# Patient Record
Sex: Female | Born: 1955
Health system: Southern US, Community
[De-identification: ages and names within clinical notes are randomized; demographics above are authoritative.]

## PROBLEM LIST (undated history)

## (undated) DIAGNOSIS — T4145XA Adverse effect of unspecified anesthetic, initial encounter: Secondary | ICD-10-CM

## (undated) DIAGNOSIS — E785 Hyperlipidemia, unspecified: Secondary | ICD-10-CM

## (undated) DIAGNOSIS — M1990 Unspecified osteoarthritis, unspecified site: Secondary | ICD-10-CM

## (undated) DIAGNOSIS — Z9889 Other specified postprocedural states: Secondary | ICD-10-CM

## (undated) DIAGNOSIS — R42 Dizziness and giddiness: Secondary | ICD-10-CM

## (undated) DIAGNOSIS — T8859XA Other complications of anesthesia, initial encounter: Secondary | ICD-10-CM

## (undated) DIAGNOSIS — I10 Essential (primary) hypertension: Secondary | ICD-10-CM

## (undated) DIAGNOSIS — R112 Nausea with vomiting, unspecified: Secondary | ICD-10-CM

## (undated) DIAGNOSIS — K5792 Diverticulitis of intestine, part unspecified, without perforation or abscess without bleeding: Secondary | ICD-10-CM

## (undated) DIAGNOSIS — G8929 Other chronic pain: Secondary | ICD-10-CM

## (undated) DIAGNOSIS — F419 Anxiety disorder, unspecified: Secondary | ICD-10-CM

## (undated) DIAGNOSIS — K219 Gastro-esophageal reflux disease without esophagitis: Secondary | ICD-10-CM

## (undated) DIAGNOSIS — T7840XA Allergy, unspecified, initial encounter: Secondary | ICD-10-CM

## (undated) DIAGNOSIS — J45909 Unspecified asthma, uncomplicated: Secondary | ICD-10-CM

## (undated) DIAGNOSIS — M549 Dorsalgia, unspecified: Secondary | ICD-10-CM

## (undated) HISTORY — PX: ABDOMINAL HYSTERECTOMY: SHX81

## (undated) HISTORY — DX: Hyperlipidemia, unspecified: E78.5

## (undated) HISTORY — DX: Anxiety disorder, unspecified: F41.9

## (undated) HISTORY — PX: OTHER SURGICAL HISTORY: SHX169

## (undated) HISTORY — PX: HIP SURGERY: SHX245

## (undated) HISTORY — PX: HAND SURGERY: SHX662

## (undated) HISTORY — DX: Unspecified osteoarthritis, unspecified site: M19.90

## (undated) HISTORY — DX: Allergy, unspecified, initial encounter: T78.40XA

---
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
## 1998-02-03 ENCOUNTER — Other Ambulatory Visit: Admission: RE | Admit: 1998-02-03 | Discharge: 1998-02-03 | Payer: Self-pay

## 1998-10-30 ENCOUNTER — Emergency Department (HOSPITAL_COMMUNITY): Admission: EM | Admit: 1998-10-30 | Discharge: 1998-10-30 | Payer: Self-pay | Admitting: Internal Medicine

## 1999-09-23 ENCOUNTER — Emergency Department (HOSPITAL_COMMUNITY): Admission: EM | Admit: 1999-09-23 | Discharge: 1999-09-23 | Payer: Self-pay | Admitting: Emergency Medicine

## 2001-01-22 ENCOUNTER — Emergency Department (HOSPITAL_COMMUNITY): Admission: EM | Admit: 2001-01-22 | Discharge: 2001-01-22 | Payer: Self-pay | Admitting: Emergency Medicine

## 2001-01-30 ENCOUNTER — Encounter: Payer: Self-pay | Admitting: Internal Medicine

## 2001-01-30 ENCOUNTER — Ambulatory Visit (HOSPITAL_COMMUNITY): Admission: RE | Admit: 2001-01-30 | Discharge: 2001-01-30 | Payer: Self-pay | Admitting: Internal Medicine

## 2001-04-15 ENCOUNTER — Emergency Department (HOSPITAL_COMMUNITY): Admission: EM | Admit: 2001-04-15 | Discharge: 2001-04-15 | Payer: Self-pay | Admitting: Emergency Medicine

## 2001-04-25 ENCOUNTER — Emergency Department (HOSPITAL_COMMUNITY): Admission: EM | Admit: 2001-04-25 | Discharge: 2001-04-25 | Payer: Self-pay | Admitting: *Deleted

## 2002-05-16 ENCOUNTER — Emergency Department (HOSPITAL_COMMUNITY): Admission: EM | Admit: 2002-05-16 | Discharge: 2002-05-16 | Payer: Self-pay | Admitting: Emergency Medicine

## 2003-12-13 ENCOUNTER — Emergency Department (HOSPITAL_COMMUNITY): Admission: EM | Admit: 2003-12-13 | Discharge: 2003-12-13 | Payer: Self-pay | Admitting: Emergency Medicine

## 2004-05-14 ENCOUNTER — Emergency Department (HOSPITAL_COMMUNITY): Admission: EM | Admit: 2004-05-14 | Discharge: 2004-05-14 | Payer: Self-pay | Admitting: Emergency Medicine

## 2004-05-15 ENCOUNTER — Emergency Department (HOSPITAL_COMMUNITY): Admission: EM | Admit: 2004-05-15 | Discharge: 2004-05-15 | Payer: Self-pay | Admitting: Emergency Medicine

## 2004-12-02 ENCOUNTER — Emergency Department (HOSPITAL_COMMUNITY): Admission: EM | Admit: 2004-12-02 | Discharge: 2004-12-02 | Payer: Self-pay | Admitting: Emergency Medicine

## 2005-01-15 ENCOUNTER — Emergency Department (HOSPITAL_COMMUNITY): Admission: EM | Admit: 2005-01-15 | Discharge: 2005-01-15 | Payer: Self-pay | Admitting: Emergency Medicine

## 2005-06-30 ENCOUNTER — Emergency Department (HOSPITAL_COMMUNITY): Admission: EM | Admit: 2005-06-30 | Discharge: 2005-07-01 | Payer: Self-pay | Admitting: Emergency Medicine

## 2005-08-02 ENCOUNTER — Emergency Department (HOSPITAL_COMMUNITY): Admission: EM | Admit: 2005-08-02 | Discharge: 2005-08-02 | Payer: Self-pay | Admitting: Emergency Medicine

## 2005-08-10 ENCOUNTER — Ambulatory Visit: Payer: Self-pay | Admitting: Family Medicine

## 2005-08-14 ENCOUNTER — Ambulatory Visit: Payer: Self-pay | Admitting: *Deleted

## 2005-08-22 ENCOUNTER — Ambulatory Visit: Payer: Self-pay | Admitting: Family Medicine

## 2005-09-14 ENCOUNTER — Ambulatory Visit: Payer: Self-pay | Admitting: Family Medicine

## 2005-09-28 ENCOUNTER — Ambulatory Visit: Payer: Self-pay | Admitting: Family Medicine

## 2005-10-11 ENCOUNTER — Emergency Department (HOSPITAL_COMMUNITY): Admission: EM | Admit: 2005-10-11 | Discharge: 2005-10-11 | Payer: Self-pay | Admitting: Emergency Medicine

## 2005-11-26 ENCOUNTER — Ambulatory Visit: Payer: Self-pay | Admitting: Family Medicine

## 2006-07-09 ENCOUNTER — Ambulatory Visit: Payer: Self-pay | Admitting: Family Medicine

## 2006-11-25 ENCOUNTER — Ambulatory Visit: Payer: Self-pay | Admitting: Family Medicine

## 2007-03-25 ENCOUNTER — Ambulatory Visit: Payer: Self-pay | Admitting: Family Medicine

## 2007-03-25 ENCOUNTER — Encounter (INDEPENDENT_AMBULATORY_CARE_PROVIDER_SITE_OTHER): Payer: Self-pay | Admitting: Internal Medicine

## 2007-03-25 LAB — CONVERTED CEMR LAB
Basophils Absolute: 0 10*3/uL (ref 0.0–0.1)
Basophils Relative: 0 % (ref 0–1)
Cholesterol: 175 mg/dL (ref 0–200)
Eosinophils Absolute: 0.1 10*3/uL (ref 0.0–0.7)
Eosinophils Relative: 2 % (ref 0–5)
HCT: 40.9 % (ref 36.0–46.0)
HDL: 33 mg/dL — ABNORMAL LOW (ref >39)
Hemoglobin: 13.6 g/dL (ref 12.0–15.0)
LDL Cholesterol: 112 mg/dL — ABNORMAL HIGH (ref 0–99)
Lymphocytes Relative: 44 % (ref 12–46)
Lymphs Abs: 2.1 10*3/uL (ref 0.7–3.3)
MCHC: 33.3 g/dL (ref 30.0–36.0)
MCV: 90.5 fL (ref 78.0–100.0)
Monocytes Absolute: 0.3 10*3/uL (ref 0.2–0.7)
Monocytes Relative: 7 % (ref 3–11)
Neutro Abs: 2.3 10*3/uL (ref 1.7–7.7)
Neutrophils Relative %: 47 % (ref 43–77)
Platelets: 274 10*3/uL (ref 150–400)
RBC: 4.52 M/uL (ref 3.87–5.11)
RDW: 13.4 % (ref 11.5–14.0)
TSH: 0.889 microintl units/mL (ref 0.350–5.50)
Total CHOL/HDL Ratio: 5.3
Triglycerides: 152 mg/dL — ABNORMAL HIGH (ref <150)
VLDL: 30 mg/dL (ref 0–40)
WBC: 4.9 10*3/uL (ref 4.0–10.5)

## 2007-04-09 ENCOUNTER — Encounter (INDEPENDENT_AMBULATORY_CARE_PROVIDER_SITE_OTHER): Payer: Self-pay | Admitting: *Deleted

## 2007-05-04 ENCOUNTER — Emergency Department (HOSPITAL_COMMUNITY): Admission: EM | Admit: 2007-05-04 | Discharge: 2007-05-04 | Payer: Self-pay | Admitting: Emergency Medicine

## 2007-06-16 ENCOUNTER — Emergency Department (HOSPITAL_COMMUNITY): Admission: EM | Admit: 2007-06-16 | Discharge: 2007-06-16 | Payer: Self-pay | Admitting: Family Medicine

## 2007-09-25 ENCOUNTER — Emergency Department (HOSPITAL_COMMUNITY): Admission: EM | Admit: 2007-09-25 | Discharge: 2007-09-25 | Payer: Self-pay | Admitting: Family Medicine

## 2008-04-28 ENCOUNTER — Encounter: Admission: RE | Admit: 2008-04-28 | Discharge: 2008-04-28 | Payer: Self-pay | Admitting: Internal Medicine

## 2008-09-12 ENCOUNTER — Inpatient Hospital Stay (HOSPITAL_COMMUNITY): Admission: AD | Admit: 2008-09-12 | Discharge: 2008-09-13 | Payer: Self-pay | Admitting: Obstetrics and Gynecology

## 2008-09-14 ENCOUNTER — Ambulatory Visit (HOSPITAL_COMMUNITY): Admission: RE | Admit: 2008-09-14 | Discharge: 2008-09-14 | Payer: Self-pay | Admitting: Obstetrics & Gynecology

## 2009-05-21 ENCOUNTER — Emergency Department (HOSPITAL_COMMUNITY): Admission: EM | Admit: 2009-05-21 | Discharge: 2009-05-21 | Payer: Self-pay | Admitting: Emergency Medicine

## 2009-05-25 ENCOUNTER — Inpatient Hospital Stay (HOSPITAL_COMMUNITY): Admission: AD | Admit: 2009-05-25 | Discharge: 2009-05-25 | Payer: Self-pay | Admitting: Obstetrics and Gynecology

## 2009-08-29 ENCOUNTER — Observation Stay (HOSPITAL_COMMUNITY): Admission: EM | Admit: 2009-08-29 | Discharge: 2009-08-31 | Payer: Self-pay | Admitting: Emergency Medicine

## 2010-05-03 ENCOUNTER — Encounter: Payer: Self-pay | Admitting: Family Medicine

## 2010-08-24 NOTE — Miscellaneous (Signed)
Summary: VIP  Patient: Christina Ray Note: All result statuses are Final unless otherwise noted.  Tests: (1) VIP (Medications)   LLIMPORTMEDS              "Result Below..."       RESULT: RHINOCORT AQUA SUSP 32 MCG/ACT*SPRAY 2 PUFFS IN EACH NOSTRIL DAILY*10/24/2006*Last Refill: [email or web address]*******   LLIMPORTMEDS              "Result Below..."       RESULT: NASACORT AQ AERS 55 MCG/ACT*USE 1-2 SPRAYS IN EACH NOSTRIL DAILY*11/25/2006*Last Refill: [email or web address]*[id number]*******   LLIMPORTMEDS              "Result Below..."       RESULT: LISINOPRIL-HYDROCHLOROTHIAZIDE TABS 20-25 MG*TAKE ONE (1) TABLET BY MOUTH EVERY DAY*11/25/2006*Last Refill: 04/22/2007*[id number]*******   LLIMPORTMEDS              "Result Below..."       RESULT: FAMOTIDINE TABS 20 MG*TAKE 1 TO 2 TABLETS EVERY DAY AT BEDTIME*03/25/2007*Last Refill: [email or web address]*******   LLIMPORTMEDS              "Result Below..."       RESULT: ATENOLOL TABS 50 MG*TAKE TWO (2) TABLETS BY MOUTH DAILY*11/25/2006*Last Refill: 04/22/2007*2012*******   LLIMPORTMEDS              "Result Below..."       RESULT: ALLEGRA TABS 180 MG*TAKE ONE (1) TABLET BY MOUTH EVERY DAY*11/25/2006*Last Refill: 03/20/2007*[id number]*******   LLIMPORTALLS              NKDA***  Note: An exclamation mark (!) indicates a result that was not dispersed into the flowsheet. Document Creation Date: 05/22/2007 3:05 PM _______________________________________________________________________  (1) Order result status: Final Collection or observation date-time: 04/09/2007 Requested date-time: 04/09/2007 Receipt date-time:  Reported date-time: 04/09/2007 Referring Physician:   Ordering Physician:   Specimen Source:  Source: [facility] [place] Order Number:  Lab site:

## 2010-08-24 NOTE — Miscellaneous (Signed)
Summary: Home Care Report  Home Care Report   Imported By: Lind Guest 05/03/2010 11:13:39  _____________________________________________________________________  External Attachment:    Type:   Image     Comment:   External Document

## 2010-09-27 ENCOUNTER — Emergency Department (HOSPITAL_COMMUNITY)
Admission: EM | Admit: 2010-09-27 | Discharge: 2010-09-27 | Disposition: A | Discharge status: Home / Self Care | Payer: BC Managed Care – PPO | Attending: Emergency Medicine | Admitting: Emergency Medicine

## 2010-09-27 DIAGNOSIS — I10 Essential (primary) hypertension: Secondary | ICD-10-CM | POA: Insufficient documentation

## 2010-09-27 DIAGNOSIS — H109 Unspecified conjunctivitis: Secondary | ICD-10-CM | POA: Insufficient documentation

## 2010-10-11 LAB — COMPREHENSIVE METABOLIC PANEL
ALT: 13 U/L (ref 0–35)
AST: 14 U/L (ref 0–37)
Albumin: 3.1 g/dL — ABNORMAL LOW (ref 3.5–5.2)
Alkaline Phosphatase: 47 U/L (ref 39–117)
BUN: 13 mg/dL (ref 6–23)
CO2: 30 mEq/L (ref 19–32)
Calcium: 9.1 mg/dL (ref 8.4–10.5)
Chloride: 104 mEq/L (ref 96–112)
Creatinine, Ser: 0.96 mg/dL (ref 0.4–1.2)
GFR calc Af Amer: >60 mL/min (ref >60)
GFR calc non Af Amer: >60 mL/min (ref >60)
Glucose, Bld: 85 mg/dL (ref 70–99)
Potassium: 3.8 mEq/L (ref 3.5–5.1)
Sodium: 141 mEq/L (ref 135–145)
Total Bilirubin: 0.7 mg/dL (ref 0.3–1.2)
Total Protein: 6.7 g/dL (ref 6.0–8.3)

## 2010-10-11 LAB — DIFFERENTIAL
Basophils Absolute: 0 10*3/uL (ref 0.0–0.1)
Basophils Relative: 1 % (ref 0–1)
Eosinophils Absolute: 0.1 10*3/uL (ref 0.0–0.7)
Eosinophils Relative: 2 % (ref 0–5)
Lymphocytes Relative: 54 % — ABNORMAL HIGH (ref 12–46)
Lymphs Abs: 3.1 10*3/uL (ref 0.7–4.0)
Monocytes Absolute: 0.3 10*3/uL (ref 0.1–1.0)
Monocytes Relative: 6 % (ref 3–12)
Neutro Abs: 2.1 10*3/uL (ref 1.7–7.7)
Neutrophils Relative %: 37 % — ABNORMAL LOW (ref 43–77)

## 2010-10-11 LAB — CBC
HCT: 34.1 % — ABNORMAL LOW (ref 36.0–46.0)
Hemoglobin: 11.9 g/dL — ABNORMAL LOW (ref 12.0–15.0)
MCHC: 35 g/dL (ref 30.0–36.0)
MCV: 92.3 fL (ref 78.0–100.0)
Platelets: 238 10*3/uL (ref 150–400)
RBC: 3.69 MIL/uL — ABNORMAL LOW (ref 3.87–5.11)
RDW: 13.4 % (ref 11.5–15.5)
WBC: 5.7 10*3/uL (ref 4.0–10.5)

## 2010-10-11 LAB — URINALYSIS, ROUTINE W REFLEX MICROSCOPIC
Bilirubin Urine: NEGATIVE
Glucose, UA: NEGATIVE mg/dL
Hgb urine dipstick: NEGATIVE
Ketones, ur: NEGATIVE mg/dL
Nitrite: NEGATIVE
Protein, ur: NEGATIVE mg/dL
Specific Gravity, Urine: 1.014 (ref 1.005–1.030)
Urobilinogen, UA: 0.2 mg/dL (ref 0.0–1.0)
pH: 6.5 (ref 5.0–8.0)

## 2010-10-11 LAB — BRAIN NATRIURETIC PEPTIDE: Pro B Natriuretic peptide (BNP): 33 pg/mL (ref 0.0–100.0)

## 2010-10-11 LAB — POCT I-STAT, CHEM 8
BUN: 12 mg/dL (ref 6–23)
Calcium, Ion: 1.17 mmol/L (ref 1.12–1.32)
Chloride: 104 mEq/L (ref 96–112)
Creatinine, Ser: 1 mg/dL (ref 0.4–1.2)
Glucose, Bld: 91 mg/dL (ref 70–99)
HCT: 41 % (ref 36.0–46.0)
Hemoglobin: 13.9 g/dL (ref 12.0–15.0)
Potassium: 3.5 mEq/L (ref 3.5–5.1)
Sodium: 140 mEq/L (ref 135–145)
TCO2: 31 mmol/L (ref 0–100)

## 2010-10-11 LAB — TSH: TSH: 1.177 u[IU]/mL (ref 0.350–4.500)

## 2010-10-11 LAB — LIPID PANEL
Cholesterol: 190 mg/dL (ref 0–200)
HDL: 36 mg/dL — ABNORMAL LOW (ref >39)
LDL Cholesterol: 136 mg/dL — ABNORMAL HIGH (ref 0–99)
Total CHOL/HDL Ratio: 5.3 RATIO
Triglycerides: 92 mg/dL (ref <150)
VLDL: 18 mg/dL (ref 0–40)

## 2010-10-11 LAB — CK TOTAL AND CKMB (NOT AT ARMC)
CK, MB: 1.9 ng/mL (ref 0.3–4.0)
Relative Index: 1.3 (ref 0.0–2.5)
Total CK: 145 U/L (ref 7–177)

## 2010-10-11 LAB — POCT CARDIAC MARKERS
CKMB, poc: 1.6 ng/mL (ref 1.0–8.0)
Myoglobin, poc: 88 ng/mL (ref 12–200)
Troponin i, poc: <0.05 ng/mL (ref 0.00–0.09)

## 2010-10-11 LAB — HEMOGLOBIN A1C
Hgb A1c MFr Bld: 5.9 % (ref 4.6–6.1)
Mean Plasma Glucose: 123 mg/dL

## 2010-10-11 LAB — TROPONIN I: Troponin I: 0.02 ng/mL (ref 0.00–0.06)

## 2010-10-11 LAB — D-DIMER, QUANTITATIVE (NOT AT ARMC): D-Dimer, Quant: 0.44 ug/mL-FEU (ref 0.00–0.48)

## 2010-10-26 LAB — URINALYSIS, ROUTINE W REFLEX MICROSCOPIC
Bilirubin Urine: NEGATIVE
Glucose, UA: NEGATIVE mg/dL
Hgb urine dipstick: NEGATIVE
Ketones, ur: NEGATIVE mg/dL
Nitrite: NEGATIVE
Protein, ur: NEGATIVE mg/dL
Specific Gravity, Urine: 1.011 (ref 1.005–1.030)
Urobilinogen, UA: 0.2 mg/dL (ref 0.0–1.0)
pH: 5.5 (ref 5.0–8.0)

## 2010-10-26 LAB — URINE CULTURE
Colony Count: NO GROWTH
Culture: NO GROWTH

## 2010-10-26 LAB — GC/CHLAMYDIA PROBE AMP, GENITAL
Chlamydia, DNA Probe: NEGATIVE
GC Probe Amp, Genital: NEGATIVE

## 2010-10-26 LAB — WET PREP, GENITAL
Trich, Wet Prep: NONE SEEN
WBC, Wet Prep HPF POC: NONE SEEN
Yeast Wet Prep HPF POC: NONE SEEN

## 2010-10-26 LAB — GLUCOSE, CAPILLARY: Glucose-Capillary: 92 mg/dL (ref 70–99)

## 2010-11-07 LAB — CBC
HCT: 37.9 % (ref 36.0–46.0)
Hemoglobin: 12.8 g/dL (ref 12.0–15.0)
MCHC: 33.7 g/dL (ref 30.0–36.0)
MCV: 91.4 fL (ref 78.0–100.0)
Platelets: 242 10*3/uL (ref 150–400)
RBC: 4.15 MIL/uL (ref 3.87–5.11)
RDW: 13.7 % (ref 11.5–15.5)
WBC: 6.1 10*3/uL (ref 4.0–10.5)

## 2010-11-07 LAB — DIFFERENTIAL
Basophils Absolute: 0 10*3/uL (ref 0.0–0.1)
Basophils Relative: 1 % (ref 0–1)
Eosinophils Absolute: 0.1 10*3/uL (ref 0.0–0.7)
Eosinophils Relative: 1 % (ref 0–5)
Lymphocytes Relative: 46 % (ref 12–46)
Lymphs Abs: 2.8 10*3/uL (ref 0.7–4.0)
Monocytes Absolute: 0.4 10*3/uL (ref 0.1–1.0)
Monocytes Relative: 6 % (ref 3–12)
Neutro Abs: 2.8 10*3/uL (ref 1.7–7.7)
Neutrophils Relative %: 46 % (ref 43–77)

## 2010-11-07 LAB — WET PREP, GENITAL
Trich, Wet Prep: NONE SEEN
Yeast Wet Prep HPF POC: NONE SEEN

## 2010-11-07 LAB — URINALYSIS, ROUTINE W REFLEX MICROSCOPIC
Bilirubin Urine: NEGATIVE
Glucose, UA: NEGATIVE mg/dL
Hgb urine dipstick: NEGATIVE
Ketones, ur: NEGATIVE mg/dL
Nitrite: NEGATIVE
Protein, ur: NEGATIVE mg/dL
Specific Gravity, Urine: >1.03 — ABNORMAL HIGH (ref 1.005–1.030)
Urobilinogen, UA: 0.2 mg/dL (ref 0.0–1.0)
pH: 6 (ref 5.0–8.0)

## 2011-05-01 LAB — CULTURE, ROUTINE-ABSCESS
Culture: NO GROWTH
Gram Stain: NONE SEEN

## 2013-03-21 ENCOUNTER — Emergency Department (HOSPITAL_COMMUNITY): Payer: BC Managed Care – PPO

## 2013-03-21 ENCOUNTER — Emergency Department (HOSPITAL_COMMUNITY)
Admission: EM | Admit: 2013-03-21 | Discharge: 2013-03-21 | Disposition: A | Discharge status: Home / Self Care | Payer: BC Managed Care – PPO | Attending: Emergency Medicine | Admitting: Emergency Medicine

## 2013-03-21 ENCOUNTER — Encounter (HOSPITAL_COMMUNITY): Payer: Self-pay | Admitting: *Deleted

## 2013-03-21 DIAGNOSIS — J45909 Unspecified asthma, uncomplicated: Secondary | ICD-10-CM | POA: Insufficient documentation

## 2013-03-21 DIAGNOSIS — I10 Essential (primary) hypertension: Secondary | ICD-10-CM | POA: Insufficient documentation

## 2013-03-21 DIAGNOSIS — R079 Chest pain, unspecified: Secondary | ICD-10-CM

## 2013-03-21 DIAGNOSIS — R188 Other ascites: Secondary | ICD-10-CM | POA: Insufficient documentation

## 2013-03-21 DIAGNOSIS — Z87891 Personal history of nicotine dependence: Secondary | ICD-10-CM | POA: Insufficient documentation

## 2013-03-21 DIAGNOSIS — Z8719 Personal history of other diseases of the digestive system: Secondary | ICD-10-CM | POA: Insufficient documentation

## 2013-03-21 DIAGNOSIS — E669 Obesity, unspecified: Secondary | ICD-10-CM | POA: Insufficient documentation

## 2013-03-21 HISTORY — DX: Gastro-esophageal reflux disease without esophagitis: K21.9

## 2013-03-21 HISTORY — DX: Essential (primary) hypertension: I10

## 2013-03-21 HISTORY — DX: Diverticulitis of intestine, part unspecified, without perforation or abscess without bleeding: K57.92

## 2013-03-21 HISTORY — DX: Unspecified asthma, uncomplicated: J45.909

## 2013-03-21 LAB — BASIC METABOLIC PANEL
BUN: 16 mg/dL (ref 6–23)
CO2: 29 mEq/L (ref 19–32)
Calcium: 9.4 mg/dL (ref 8.4–10.5)
Chloride: 98 mEq/L (ref 96–112)
Creatinine, Ser: 1.1 mg/dL (ref 0.50–1.10)
GFR calc Af Amer: 63 mL/min — ABNORMAL LOW (ref >90)
GFR calc non Af Amer: 54 mL/min — ABNORMAL LOW (ref >90)
Glucose, Bld: 105 mg/dL — ABNORMAL HIGH (ref 70–99)
Potassium: 3.6 mEq/L (ref 3.5–5.1)
Sodium: 136 mEq/L (ref 135–145)

## 2013-03-21 LAB — CBC WITH DIFFERENTIAL/PLATELET
Basophils Absolute: 0 10*3/uL (ref 0.0–0.1)
Basophils Relative: 0 % (ref 0–1)
Eosinophils Absolute: 0.1 10*3/uL (ref 0.0–0.7)
Eosinophils Relative: 2 % (ref 0–5)
HCT: 37.3 % (ref 36.0–46.0)
Hemoglobin: 12.4 g/dL (ref 12.0–15.0)
Lymphocytes Relative: 45 % (ref 12–46)
Lymphs Abs: 2.3 10*3/uL (ref 0.7–4.0)
MCH: 30.2 pg (ref 26.0–34.0)
MCHC: 33.2 g/dL (ref 30.0–36.0)
MCV: 91 fL (ref 78.0–100.0)
Monocytes Absolute: 0.3 10*3/uL (ref 0.1–1.0)
Monocytes Relative: 6 % (ref 3–12)
Neutro Abs: 2.4 10*3/uL (ref 1.7–7.7)
Neutrophils Relative %: 48 % (ref 43–77)
Platelets: 251 10*3/uL (ref 150–400)
RBC: 4.1 MIL/uL (ref 3.87–5.11)
RDW: 13.4 % (ref 11.5–15.5)
WBC: 5.1 10*3/uL (ref 4.0–10.5)

## 2013-03-21 LAB — TROPONIN I: Troponin I: <0.3 ng/mL (ref <0.30)

## 2013-03-21 NOTE — ED Provider Notes (Signed)
CSN: [id number]     Arrival date & time 03/21/13  1826 History   First MD Initiated Contact with Patient 03/21/13 1830     Chief Complaint  Patient presents with  . Chest Pain   (Consider location/radiation/quality/duration/timing/severity/associated sxs/prior Treatment) HPI... episode of lower sternal discomfort since 9 PM last night. Symptoms are currently gone. Pain is worse with positioning and lying down. No dyspnea, diaphoresis, nausea.  No previous heart trouble. She does not smoke. Family history negative. Symptoms were fleeting.  Past Medical History  Diagnosis Date  . Hypertension   . GERD (gastroesophageal reflux disease)   . Asthma   . Diverticulitis    Past Surgical History  Procedure Laterality Date  . Abdominal hysterectomy    . Arthroscopic knee surgery Right   . Hand surgery Right    History reviewed. No pertinent family history. History  Substance Use Topics  . Smoking status: Former Games developer  . Smokeless tobacco: Not on file  . Alcohol Use: No   OB History   Grav Para Term Preterm Abortions TAB SAB Ect Mult Living                 Review of Systems  All other systems reviewed and are negative.    Allergies  Review of patient's allergies indicates no known allergies.  Home Medications  No current outpatient prescriptions on file. BP 144/83  Pulse 67  Temp(Src) 98.5 F (36.9 C) (Oral)  Resp 20  Ht 5\' 11"  (1.803 m)  Wt 322 lb 6 oz (146.228 kg)  BMI 44.98 kg/m2  SpO2 100% Physical Exam  Nursing note and vitals reviewed. Constitutional: She is oriented to person, place, and time. She appears well-developed and well-nourished.  Obese  HENT:  Head: Normocephalic and atraumatic.  Eyes: Conjunctivae and EOM are normal. Pupils are equal, round, and reactive to light.  Neck: Normal range of motion. Neck supple.  Cardiovascular: Normal rate, regular rhythm and normal heart sounds.   Pulmonary/Chest: Effort normal and breath sounds normal.   Abdominal: Soft. Bowel sounds are normal.  Musculoskeletal: Normal range of motion.  Neurological: She is alert and oriented to person, place, and time.  Skin: Skin is warm and dry.  Psychiatric: She has a normal mood and affect.    ED Course  Procedures (including critical care time) Labs Review Labs Reviewed  BASIC METABOLIC PANEL - Abnormal; Notable for the following:    Glucose, Bld 105 (*)    GFR calc non Af Amer 54 (*)    GFR calc Af Amer 63 (*)    All other components within normal limits  TROPONIN I  CBC WITH DIFFERENTIAL   Imaging Review No results found.  Date: 03/21/2013  Rate: 59  Rhythm: sinus brady  QRS Axis: left  Intervals: normal  ST/T Wave abnormalities: normal  Conduction Disutrbances: none  Narrative Interpretation: unremarkable    MDM  No diagnosis found. Symptoms are completely gone.  EKG and troponin negative. Patient in no acute distress    Donnetta Hutching, MD 03/21/13 2131

## 2013-03-21 NOTE — ED Notes (Addendum)
Patient c/o chest discomfort, burning, soreness w/burping and flatus since last night. No n/v or diaphoresis. Has progressively gotten worse.  Also feels heart racing.  Took some pepto-bismal last night which relieved it enough to sleep.  Also too 81 mg ASA. Has taken advil and tylenol.

## 2013-09-26 ENCOUNTER — Emergency Department (HOSPITAL_COMMUNITY)
Admission: EM | Admit: 2013-09-26 | Discharge: 2013-09-26 | Disposition: A | Discharge status: Home / Self Care | Payer: BC Managed Care – PPO | Source: Home / Self Care | Attending: Family Medicine | Admitting: Family Medicine

## 2013-09-26 ENCOUNTER — Encounter (HOSPITAL_COMMUNITY): Payer: Self-pay | Admitting: Emergency Medicine

## 2013-09-26 DIAGNOSIS — J019 Acute sinusitis, unspecified: Secondary | ICD-10-CM

## 2013-09-26 MED ORDER — IPRATROPIUM BROMIDE 0.06 % NA SOLN: 2.0000 | Freq: Four times a day (QID) | NASAL | Status: DC

## 2013-09-26 MED ORDER — MINOCYCLINE HCL 100 MG PO CAPS: 100.0000 mg | ORAL_CAPSULE | Freq: Two times a day (BID) | ORAL | Status: DC

## 2013-09-26 NOTE — ED Notes (Signed)
Pt  Reports  Symptoms  Of  Nasal  stuffyness           sorethroat  And  Mucous  Production        With  The  Symptoms  X  2  Weeks

## 2013-09-26 NOTE — Discharge Instructions (Signed)
Drink plenty of fluids as discussed, use medicine as prescribed, and mucinex or delsym for cough. Return or see your doctor if further problems °

## 2013-09-26 NOTE — ED Provider Notes (Signed)
CSN: [id number]     Arrival date & time 09/26/13  1026 History   First MD Initiated Contact with Patient 09/26/13 1130     Chief Complaint  Patient presents with  . URI   (Consider location/radiation/quality/duration/timing/severity/associated sxs/prior Treatment) Patient is a 58 y.o. female presenting with URI. The history is provided by the patient.  URI Presenting symptoms: congestion, cough, ear pain and rhinorrhea   Severity:  Mild Onset quality:  Gradual Duration:  2 weeks Progression:  Unchanged Chronicity:  New   Past Medical History  Diagnosis Date  . Hypertension   . GERD (gastroesophageal reflux disease)   . Asthma   . Diverticulitis    Past Surgical History  Procedure Laterality Date  . Abdominal hysterectomy    . Arthroscopic knee surgery Right   . Hand surgery Right    History reviewed. No pertinent family history. History  Substance Use Topics  . Smoking status: Former Games developermoker  . Smokeless tobacco: Not on file  . Alcohol Use: No   OB History   Grav Para Term Preterm Abortions TAB SAB Ect Mult Living                 Review of Systems  Constitutional: Negative.   HENT: Positive for congestion, ear pain, postnasal drip and rhinorrhea.   Eyes: Negative.   Respiratory: Positive for cough.   Cardiovascular: Negative.     Allergies  Review of patient's allergies indicates no known allergies.  Home Medications   Current Outpatient Rx  Name  Route  Sig  Dispense  Refill  . atenolol (TENORMIN) 50 MG tablet   Oral   Take 50 mg by mouth daily.         Marland Kitchen. ipratropium (ATROVENT) 0.06 % nasal spray   Each Nare   Place 2 sprays into both nostrils 4 (four) times daily.   15 mL   1   . lisinopril-hydrochlorothiazide (PRINZIDE,ZESTORETIC) 20-25 MG per tablet   Oral   Take 1 tablet by mouth daily.         . minocycline (MINOCIN,DYNACIN) 100 MG capsule   Oral   Take 1 capsule (100 mg total) by mouth 2 (two) times daily.   20 capsule   0    BP  146/74  Pulse 58  Temp(Src) 98.9 F (37.2 C) (Oral)  Resp 16  SpO2 100% Physical Exam  Nursing note and vitals reviewed. Constitutional: She is oriented to person, place, and time. She appears well-developed and well-nourished.  HENT:  Head: Normocephalic.  Right Ear: External ear normal.  Left Ear: External ear normal.  Nose: Mucosal edema and rhinorrhea present.  Mouth/Throat: Oropharynx is clear and moist.  Neck: Normal range of motion. Neck supple.  Cardiovascular: Regular rhythm and normal heart sounds.   Pulmonary/Chest: Effort normal and breath sounds normal.  Lymphadenopathy:    She has no cervical adenopathy.  Neurological: She is alert and oriented to person, place, and time.  Skin: Skin is warm and dry.    ED Course  Procedures (including critical care time) Labs Review Labs Reviewed - No data to display Imaging Review No results found.   MDM   1. Sinusitis, acute       Linna HoffJames D Tron Flythe, MD 09/26/13 [phone]

## 2013-10-13 ENCOUNTER — Other Ambulatory Visit: Payer: Self-pay

## 2013-10-13 DIAGNOSIS — Z1231 Encounter for screening mammogram for malignant neoplasm of breast: Secondary | ICD-10-CM

## 2013-10-22 ENCOUNTER — Ambulatory Visit: Payer: BC Managed Care – PPO

## 2013-10-30 ENCOUNTER — Ambulatory Visit: Payer: BC Managed Care – PPO

## 2014-06-27 ENCOUNTER — Encounter (HOSPITAL_COMMUNITY): Payer: Self-pay | Admitting: Emergency Medicine

## 2014-06-27 ENCOUNTER — Emergency Department (HOSPITAL_COMMUNITY): Payer: BC Managed Care – PPO

## 2014-06-27 ENCOUNTER — Emergency Department (HOSPITAL_COMMUNITY)
Admission: EM | Admit: 2014-06-27 | Discharge: 2014-06-27 | Disposition: A | Discharge status: Home / Self Care | Payer: BC Managed Care – PPO | Attending: Emergency Medicine | Admitting: Emergency Medicine

## 2014-06-27 DIAGNOSIS — Z87891 Personal history of nicotine dependence: Secondary | ICD-10-CM | POA: Insufficient documentation

## 2014-06-27 DIAGNOSIS — Z8719 Personal history of other diseases of the digestive system: Secondary | ICD-10-CM | POA: Insufficient documentation

## 2014-06-27 DIAGNOSIS — J45909 Unspecified asthma, uncomplicated: Secondary | ICD-10-CM | POA: Insufficient documentation

## 2014-06-27 DIAGNOSIS — Y9389 Activity, other specified: Secondary | ICD-10-CM | POA: Insufficient documentation

## 2014-06-27 DIAGNOSIS — M25561 Pain in right knee: Secondary | ICD-10-CM

## 2014-06-27 DIAGNOSIS — Y998 Other external cause status: Secondary | ICD-10-CM | POA: Insufficient documentation

## 2014-06-27 DIAGNOSIS — Z7951 Long term (current) use of inhaled steroids: Secondary | ICD-10-CM | POA: Insufficient documentation

## 2014-06-27 DIAGNOSIS — S8991XA Unspecified injury of right lower leg, initial encounter: Secondary | ICD-10-CM | POA: Insufficient documentation

## 2014-06-27 DIAGNOSIS — X58XXXA Exposure to other specified factors, initial encounter: Secondary | ICD-10-CM | POA: Insufficient documentation

## 2014-06-27 DIAGNOSIS — Z79899 Other long term (current) drug therapy: Secondary | ICD-10-CM | POA: Insufficient documentation

## 2014-06-27 DIAGNOSIS — I10 Essential (primary) hypertension: Secondary | ICD-10-CM | POA: Insufficient documentation

## 2014-06-27 DIAGNOSIS — Z7982 Long term (current) use of aspirin: Secondary | ICD-10-CM | POA: Insufficient documentation

## 2014-06-27 DIAGNOSIS — Y92092 Bedroom in other non-institutional residence as the place of occurrence of the external cause: Secondary | ICD-10-CM | POA: Insufficient documentation

## 2014-06-27 DIAGNOSIS — G8929 Other chronic pain: Secondary | ICD-10-CM | POA: Insufficient documentation

## 2014-06-27 HISTORY — DX: Other chronic pain: G89.29

## 2014-06-27 HISTORY — DX: Dorsalgia, unspecified: M54.9

## 2014-06-27 MED ORDER — IBUPROFEN 800 MG PO TABS: 800.0000 mg | ORAL_TABLET | Freq: Three times a day (TID) | ORAL | Status: DC

## 2014-06-27 MED ORDER — HYDROCODONE-ACETAMINOPHEN 5-325 MG PO TABS: 1.0000 | ORAL_TABLET | ORAL | Status: DC | PRN

## 2014-06-27 NOTE — ED Notes (Signed)
Pt states was lying in bed last night and was moving around when she felt a pop in R knee. Pt reports severe pain in back of knee, spreading up lateral aspect of knee and into front of knee. Pt reports limited ROM, stating that she can't bend or straighten knee all the way. Pt has hx of 3 surgeries to R knee.

## 2014-06-27 NOTE — Discharge Instructions (Signed)
Cryotherapy °Cryotherapy means treatment with cold. Ice or gel packs can be used to reduce both pain and swelling. Ice is the most helpful within the first 24 to 48 hours after an injury or flare-up from overusing a muscle or joint. Sprains, strains, spasms, burning pain, shooting pain, and aches can all be eased with ice. Ice can also be used when recovering from surgery. Ice is effective, has very few side effects, and is safe for most people to use. °PRECAUTIONS  °Ice is not a safe treatment option for people with: °· Raynaud phenomenon. This is a condition affecting small blood vessels in the extremities. Exposure to cold may cause your problems to return. °· Cold hypersensitivity. There are many forms of cold hypersensitivity, including: °¨ Cold urticaria. Red, itchy hives appear on the skin when the tissues begin to warm after being iced. °¨ Cold erythema. This is a red, itchy rash caused by exposure to cold. °¨ Cold hemoglobinuria. Red blood cells break down when the tissues begin to warm after being iced. The hemoglobin that carry oxygen are passed into the urine because they cannot combine with blood proteins fast enough. °· Numbness or altered sensitivity in the area being iced. °If you have any of the following conditions, do not use ice until you have discussed cryotherapy with your caregiver: °· Heart conditions, such as arrhythmia, angina, or chronic heart disease. °· High blood pressure. °· Healing wounds or open skin in the area being iced. °· Current infections. °· Rheumatoid arthritis. °· Poor circulation. °· Diabetes. °Ice slows the blood flow in the region it is applied. This is beneficial when trying to stop inflamed tissues from spreading irritating chemicals to surrounding tissues. However, if you expose your skin to cold temperatures for too long or without the proper protection, you can damage your skin or nerves. Watch for signs of skin damage due to cold. °HOME CARE INSTRUCTIONS °Follow  these tips to use ice and cold packs safely. °· Place a dry or damp towel between the ice and skin. A damp towel will cool the skin more quickly, so you may need to shorten the time that the ice is used. °· For a more rapid response, add gentle compression to the ice. °· Ice for no more than 10 to 20 minutes at a time. The bonier the area you are icing, the less time it will take to get the benefits of ice. °· Check your skin after 5 minutes to make sure there are no signs of a poor response to cold or skin damage. °· Rest 20 minutes or more between uses. °· Once your skin is numb, you can end your treatment. You can test numbness by very lightly touching your skin. The touch should be so light that you do not see the skin dimple from the pressure of your fingertip. When using ice, most people will feel these normal sensations in this order: cold, burning, aching, and numbness. °· Do not use ice on someone who cannot communicate their responses to pain, such as small children or people with dementia. °HOW TO MAKE AN ICE PACK °Ice packs are the most common way to use ice therapy. Other methods include ice massage, ice baths, and cryosprays. Muscle creams that cause a cold, tingly feeling do not offer the same benefits that ice offers and should not be used as a substitute unless recommended by your caregiver. °To make an ice pack, do one of the following: °· Place crushed ice or a   bag of frozen vegetables in a sealable plastic bag. Squeeze out the excess air. Place this bag inside another plastic bag. Slide the bag into a pillowcase or place a damp towel between your skin and the bag. °· Mix 3 parts water with 1 part rubbing alcohol. Freeze the mixture in a sealable plastic bag. When you remove the mixture from the freezer, it will be slushy. Squeeze out the excess air. Place this bag inside another plastic bag. Slide the bag into a pillowcase or place a damp towel between your skin and the bag. °SEEK MEDICAL CARE  IF: °· You develop white spots on your skin. This may give the skin a blotchy (mottled) appearance. °· Your skin turns blue or pale. °· Your skin becomes waxy or hard. °· Your swelling gets worse. °MAKE SURE YOU:  °· Understand these instructions. °· Will watch your condition. °· Will get help right away if you are not doing well or get worse. °Document Released: 03/05/2011 Document Revised: 11/23/2013 Document Reviewed: 03/05/2011 °ExitCare® Patient Information ©2015 [employer]. This information is not intended to replace advice given to you by your health care provider. Make sure you discuss any questions you have with your health care provider. ° °

## 2014-06-27 NOTE — ED Provider Notes (Signed)
CSN: [id number]     Arrival date & time 06/27/14  2222 History  This chart was scribed for non-physician practitioner, Elpidio AnisShari Immaculate Crutcher, PA-C working with Richardean Canalavid H Yao, MD by Greggory StallionKayla Andersen, ED scribe. This patient was seen in room WTR6/WTR6 and the patient's care was started at 11:10 PM.   Chief Complaint  Patient presents with  . Knee Pain   The history is provided by the patient. No language interpreter was used.    HPI Comments: Christina Ray is a 58 y.o. female who presents to the Emergency Department complaining of sudden onset posterior right knee pain that started last night. Pain radiates to the front of her knee. States she was laying in bed, moving around, and heard a pop in her knee. Bearing weight and straightening her knee worsen the pain. Reports history of 3 right knee surgeries to repair tendons and ligaments. The last surgery was about 10 years ago. She does not currently see an orthopedist.   Past Medical History  Diagnosis Date  . Hypertension   . GERD (gastroesophageal reflux disease)   . Asthma   . Diverticulitis   . Chronic back pain    Past Surgical History  Procedure Laterality Date  . Abdominal hysterectomy    . Arthroscopic knee surgery Right   . Hand surgery Right    No family history on file. History  Substance Use Topics  . Smoking status: Former Games developermoker  . Smokeless tobacco: Not on file  . Alcohol Use: No   OB History    No data available     Review of Systems  Constitutional: Negative for fever.  Respiratory: Negative for shortness of breath.   Musculoskeletal: Positive for arthralgias.  Skin: Negative for color change.  Neurological: Negative for numbness.  All other systems reviewed and are negative.  Allergies  Review of patient's allergies indicates no known allergies.  Home Medications   Prior to Admission medications   Medication Sig Start Date End Date Taking? Authorizing Provider  aspirin EC 81 MG tablet Take 81 mg by mouth  daily.   Yes Historical Provider, MD  atenolol (TENORMIN) 50 MG tablet Take 50 mg by mouth daily.   Yes Historical Provider, MD  Dextromethorphan-Guaifenesin (CORICIDIN HBP CONGESTION/COUGH) 10-200 MG CAPS Take 1 tablet by mouth every 6 (six) hours as needed (cough).   Yes Historical Provider, MD  fluticasone (FLONASE) 50 MCG/ACT nasal spray Place 2 sprays into both nostrils daily.   Yes Historical Provider, MD  lisinopril-hydrochlorothiazide (PRINZIDE,ZESTORETIC) 20-25 MG per tablet Take 1 tablet by mouth daily.   Yes Historical Provider, MD  Multiple Vitamin (MULTIVITAMIN WITH MINERALS) TABS tablet Take 1 tablet by mouth daily.   Yes Historical Provider, MD   BP 149/80 mmHg  Pulse 63  Temp(Src) 97.7 F (36.5 C) (Oral)  Resp 20  Ht 5' 11.5" (1.816 m)  Wt 320 lb (145.151 kg)  BMI 44.01 kg/m2  SpO2 99%   Physical Exam  Constitutional: She is oriented to person, place, and time. She appears well-developed and well-nourished. No distress.  HENT:  Head: Normocephalic and atraumatic.  Eyes: Conjunctivae and EOM are normal.  Neck: Neck supple.  Cardiovascular: Normal rate.   Pulmonary/Chest: Effort normal. No respiratory distress.  Musculoskeletal: Normal range of motion.  Right knee stable without laxity. No significant swelling although exam limited by body habitus. No discoloration. No calf or thigh tenderness.   Neurological: She is alert and oriented to person, place, and time.  Skin: Skin is warm  and dry.  Psychiatric: She has a normal mood and affect. Her behavior is normal.  Nursing note and vitals reviewed.   ED Course  Procedures (including critical care time)  DIAGNOSTIC STUDIES: Oxygen Saturation is 99% on RA, normal by my interpretation.    COORDINATION OF CARE: 11:13 PM-Discussed treatment plan which includes pain medication, knee immobilizer and crutches with pt at bedside and pt agreed to plan. Will give pt orthopedic referrals and advised her to follow up.   Labs  Review Labs Reviewed - No data to display  Imaging Review No results found.   EKG Interpretation None      MDM   Final diagnoses:  None    1. Right knee pain  Suspect recurrent arthritis flare. No concern for septic joint or ligament rupture. Pain control, anti-inflammatory medications. Refer to PCP  I personally performed the services described in this documentation, which was scribed in my presence. The recorded information has been reviewed and is accurate.  Arnoldo HookerShari A Elvin Mccartin, PA-C 06/27/14 2349  Richardean Canalavid H Yao, MD 06/28/14 1224

## 2014-06-27 NOTE — ED Notes (Signed)
Ortho tech at bedside 

## 2014-06-29 ENCOUNTER — Encounter: Payer: Self-pay | Admitting: Sports Medicine

## 2014-06-29 ENCOUNTER — Ambulatory Visit (INDEPENDENT_AMBULATORY_CARE_PROVIDER_SITE_OTHER): Payer: Self-pay | Admitting: Sports Medicine

## 2014-06-29 VITALS — BP 118/87 | HR 59 | Ht 72.0 in | Wt 325.0 lb

## 2014-06-29 DIAGNOSIS — M25562 Pain in left knee: Secondary | ICD-10-CM

## 2014-06-29 DIAGNOSIS — M25561 Pain in right knee: Secondary | ICD-10-CM

## 2014-06-29 MED ORDER — METHYLPREDNISOLONE ACETATE 80 MG/ML IJ SUSP
80.0000 mg | Freq: Once | INTRAMUSCULAR | Status: AC
  Administered 2014-06-29: 80 mg via INTRA_ARTICULAR

## 2014-06-29 NOTE — Progress Notes (Signed)
Subjective:    Patient ID: Christina Ray, female    DOB: 10/26/1955, 58 y.o.   MRN: [id number]  HPI Ms. Pocius is a 58 year old female who presents with bilateral knee pain, though right greater than left. She has a history of chronic knee pain, with 3 prior arthroscopic surgeries to repair] previously torn menisci in the right knee, with her last surgery being about 10-15 years ago. [facility]. Her symptoms acutely worsened on 06/27/14 when she describes that her left knee gave out on her, causing her to twist her right knee. She says that she felt a pop in her right knee followed by immediate pain and swelling. Symptoms are aggravated with weightbearing or with attempting to straighten her knee. She went to the emergency room where she is given ibuprofen and tramadol and instructed to follow-up here today. Her past medical history is also significant for obesity. She says that about 5 years ago she was told that she would likely need joint replacement surgery for the left knee. Location of pain is primarily throughout the entire knee, but also posteriorly. She notes subjective radiation of pain with burning down the shin and back of both legs, but denies any significant back pain or weakness.  Past medical history, social history, medications, and allergies were reviewed and are up to date in the chart.  Review of Systems 7 point review of systems was performed and was otherwise negative unless noted in the history of present illness.     Objective:   Physical Exam BP 118/87 mmHg  Pulse 59  Ht 6' (1.829 m)  Wt 325 lb (147.419 kg)  BMI 44.07 kg/m2 GEN: The patient is well-developed well-nourished female and in no acute distress.  She is awake alert and oriented x3. SKIN: warm and well-perfused, no rash  EXTR: No lower extremity edema or calf tenderness Neuro: Strength 5/5 globally. Sensation intact throughout. DTRs 2/4 bilaterally. No focal  deficits. Vasc: +2 bilateral distal pulses. No edema.  MSK:  Examination of the right knee reveals tenderness to palpation in the posterior knee and medial joint line. No overlying erythema or induration, trace effusion is present. She has no laxity with valgus or varus testing. Positive patellar grind test. Range of motion is from 0-100 with pain at the endpoint of motion. Examination of the left knee reveals no obvious effusion. Range of motion is from 0-120 with some crepitus, but no significant pain. Mild laxity with valgus and varus testing. Negative anterior drawer.  Limited musculoskeletal ultrasound: Views were obtained of the bilateral knees which show a mild to moderate size joint effusion on the right compared to the left. The quadriceps tendons appear intact. Patellar tendons appear intact. Examination of the right medial meniscus reveals a small meniscal cyst, though she does not have meniscal protrusion or any surrounding hypoechoic fluid. Exam is limited due to patient's body habitus.  Procedure:  Risks, benefits and complications were reviewed with the patient. After obtaining informed verbal consent the right knee was sterilely prepped with alcohol.  Ethyl chloride was used to anesthetize the skin and intra-articular injection using a 25-gauge by 1-1/2 inch needle was accomplished without difficulty.  We injected 2 cc methylprednisolone with 4 cc 1% lidocaine without epinephrine.  Patient tolerated the procedure well and was observed for 5-10 min. Post injection instructions, including signs and symptoms of complications were discussed.     Assessment & Plan:  Please see problem based assessment and plan in  the problem list.

## 2014-06-29 NOTE — Assessment & Plan Note (Addendum)
US with effusion on right compared to left. -Injected with CST today with improvement in symptoms -ACE wrap -Home bike exercises -Continue NSAID and tramadol prn -Ordered XR's of bilateral knees.  -Will call on Monday to discuss x-rays, discuss follow-up plan. May see if better bracing, CST injection, ect can manage patient's symptoms vvs. Referral for knee replacement surgery.

## 2014-06-29 NOTE — Assessment & Plan Note (Signed)
US with effusion on right compared to left. -Home bike exercises -Continue NSAID and tramadol prn -Ordered XR's of bilateral knees.  -Will call on Monday to discuss x-rays, discuss follow-up plan. May see if better bracing, CST injection, ect can manage patient's symptoms vvs. Referral for knee replacement surgery.

## 2014-07-01 ENCOUNTER — Ambulatory Visit
Admission: RE | Admit: 2014-07-01 | Discharge: 2014-07-01 | Disposition: A | Discharge status: Home / Self Care | Payer: No Typology Code available for payment source | Source: Ambulatory Visit | Attending: Sports Medicine | Admitting: Sports Medicine

## 2014-07-01 DIAGNOSIS — M25562 Pain in left knee: Secondary | ICD-10-CM

## 2014-07-01 DIAGNOSIS — M25561 Pain in right knee: Secondary | ICD-10-CM

## 2014-07-05 ENCOUNTER — Telehealth: Payer: Self-pay | Admitting: Sports Medicine

## 2014-07-05 NOTE — Telephone Encounter (Signed)
Attempted to reach patient at both numbers, no answer. Left VM on mobile stating that I was calling regarding her knee X-rays. Severe osteoarthritis of the right knee (which we injected with cortisone), as well as degenerative changes in the left knee. 4 Options: Option 1: See how injection works, continue ACE compression to help with effusion, low impact exercises, see if this provides relief and follow-up in 3 months for repeat injection or sooner if needed. Option 2: If not getting significant relief, could consider visco-supplementation (though likely costly since she does not have insurance currently). Option 3: If wanting to proceed with knee replacement surgery, [facility], which will be $150 for initial visit, then they can work out a Insurance claims handlerpayment plan. Option 4: Discuss managing symptoms, pain, functionality with primary care provider who can coordinate with pain management. I would be happy to discuss further if she has more questions. Thank you.

## 2014-07-06 ENCOUNTER — Telehealth: Payer: Self-pay | Admitting: Sports Medicine

## 2014-07-06 ENCOUNTER — Encounter: Payer: Self-pay | Admitting: *Deleted

## 2014-07-06 NOTE — Telephone Encounter (Signed)
Called patient. CST injection helped, but still having persistent bilateral R>L knee pain due to end stage right knee OA. Has tried multiple injections, including viscosupplementation in the past. Pain wakes her up at night and severely inhibits her function. Options discussed. Patient trying to get on disability, has upcoming hearing. No insurance currently. Interested in having [facility] evaluation for consideration of right knee replacement surgery. Please call patient to assist with arrangement [facility]. I explained that it would be $150 fee up front. Thank you.

## 2014-07-06 NOTE — Telephone Encounter (Signed)
-----   Message from Claiborne BillingsMartha J Delaney sent at 07/05/2014  1:58 PM EST ----- Regarding: Imaging Results Contact: [phone] Calling about imaging results and next steps

## 2014-07-06 NOTE — Patient Instructions (Signed)
[facility] [address] [place] [place] MON 07/19/14 AT 815AM

## 2014-10-01 DIAGNOSIS — E785 Hyperlipidemia, unspecified: Secondary | ICD-10-CM | POA: Diagnosis not present

## 2014-10-01 DIAGNOSIS — I10 Essential (primary) hypertension: Secondary | ICD-10-CM | POA: Diagnosis not present

## 2014-11-04 DIAGNOSIS — H2513 Age-related nuclear cataract, bilateral: Secondary | ICD-10-CM | POA: Diagnosis not present

## 2014-11-09 DIAGNOSIS — Z6841 Body Mass Index (BMI) 40.0 and over, adult: Secondary | ICD-10-CM | POA: Diagnosis not present

## 2014-11-09 DIAGNOSIS — E785 Hyperlipidemia, unspecified: Secondary | ICD-10-CM | POA: Diagnosis not present

## 2014-11-09 DIAGNOSIS — Z0000 Encounter for general adult medical examination without abnormal findings: Secondary | ICD-10-CM | POA: Diagnosis not present

## 2014-11-09 DIAGNOSIS — I10 Essential (primary) hypertension: Secondary | ICD-10-CM | POA: Diagnosis not present

## 2014-11-09 DIAGNOSIS — A084 Viral intestinal infection, unspecified: Secondary | ICD-10-CM | POA: Diagnosis not present

## 2014-11-09 DIAGNOSIS — J329 Chronic sinusitis, unspecified: Secondary | ICD-10-CM | POA: Diagnosis not present

## 2014-11-09 DIAGNOSIS — Z1389 Encounter for screening for other disorder: Secondary | ICD-10-CM | POA: Diagnosis not present

## 2014-11-17 DIAGNOSIS — H2512 Age-related nuclear cataract, left eye: Secondary | ICD-10-CM | POA: Diagnosis not present

## 2014-11-24 DIAGNOSIS — H2511 Age-related nuclear cataract, right eye: Secondary | ICD-10-CM | POA: Diagnosis not present

## 2014-12-08 DIAGNOSIS — Z6841 Body Mass Index (BMI) 40.0 and over, adult: Secondary | ICD-10-CM | POA: Diagnosis not present

## 2014-12-08 DIAGNOSIS — J309 Allergic rhinitis, unspecified: Secondary | ICD-10-CM | POA: Diagnosis not present

## 2015-01-20 DIAGNOSIS — Z6841 Body Mass Index (BMI) 40.0 and over, adult: Secondary | ICD-10-CM | POA: Diagnosis not present

## 2015-01-20 DIAGNOSIS — R197 Diarrhea, unspecified: Secondary | ICD-10-CM | POA: Diagnosis not present

## 2015-01-21 DIAGNOSIS — J309 Allergic rhinitis, unspecified: Secondary | ICD-10-CM | POA: Diagnosis not present

## 2015-01-21 DIAGNOSIS — J4520 Mild intermittent asthma, uncomplicated: Secondary | ICD-10-CM | POA: Diagnosis not present

## 2015-01-21 DIAGNOSIS — R197 Diarrhea, unspecified: Secondary | ICD-10-CM | POA: Diagnosis not present

## 2015-01-26 DIAGNOSIS — J4520 Mild intermittent asthma, uncomplicated: Secondary | ICD-10-CM | POA: Diagnosis not present

## 2015-01-26 DIAGNOSIS — J3089 Other allergic rhinitis: Secondary | ICD-10-CM | POA: Diagnosis not present

## 2015-01-26 DIAGNOSIS — J301 Allergic rhinitis due to pollen: Secondary | ICD-10-CM | POA: Diagnosis not present

## 2015-01-26 DIAGNOSIS — J3081 Allergic rhinitis due to animal (cat) (dog) hair and dander: Secondary | ICD-10-CM | POA: Diagnosis not present

## 2015-05-30 DIAGNOSIS — Z23 Encounter for immunization: Secondary | ICD-10-CM | POA: Diagnosis not present

## 2015-05-30 DIAGNOSIS — I10 Essential (primary) hypertension: Secondary | ICD-10-CM | POA: Diagnosis not present

## 2015-05-30 DIAGNOSIS — M179 Osteoarthritis of knee, unspecified: Secondary | ICD-10-CM | POA: Diagnosis not present

## 2015-05-30 DIAGNOSIS — Z6841 Body Mass Index (BMI) 40.0 and over, adult: Secondary | ICD-10-CM | POA: Diagnosis not present

## 2015-05-30 DIAGNOSIS — M545 Low back pain: Secondary | ICD-10-CM | POA: Diagnosis not present

## 2015-09-30 ENCOUNTER — Emergency Department (INDEPENDENT_AMBULATORY_CARE_PROVIDER_SITE_OTHER)
Admission: EM | Admit: 2015-09-30 | Discharge: 2015-09-30 | Disposition: A | Discharge status: Home / Self Care | Payer: Medicare Other | Source: Home / Self Care | Attending: Emergency Medicine | Admitting: Emergency Medicine

## 2015-09-30 ENCOUNTER — Encounter (HOSPITAL_COMMUNITY): Payer: Self-pay

## 2015-09-30 DIAGNOSIS — L03115 Cellulitis of right lower limb: Secondary | ICD-10-CM | POA: Diagnosis not present

## 2015-09-30 MED ORDER — CEPHALEXIN 500 MG PO CAPS: 500.0000 mg | ORAL_CAPSULE | Freq: Three times a day (TID) | ORAL | Status: DC
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
## 2015-09-30 NOTE — Discharge Instructions (Signed)
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
## 2016-04-15 ENCOUNTER — Encounter (HOSPITAL_COMMUNITY): Payer: Self-pay | Admitting: Emergency Medicine

## 2016-04-15 ENCOUNTER — Ambulatory Visit (HOSPITAL_COMMUNITY)
Admission: EM | Admit: 2016-04-15 | Discharge: 2016-04-15 | Disposition: A | Discharge status: Home / Self Care | Payer: Medicare Other | Attending: Internal Medicine | Admitting: Internal Medicine

## 2016-04-15 DIAGNOSIS — L259 Unspecified contact dermatitis, unspecified cause: Secondary | ICD-10-CM | POA: Diagnosis not present

## 2016-04-15 MED ORDER — METHYLPREDNISOLONE ACETATE 80 MG/ML IJ SUSP
INTRAMUSCULAR | Status: AC
  Filled 2016-04-15: qty 1

## 2016-04-15 MED ORDER — METHYLPREDNISOLONE ACETATE 80 MG/ML IJ SUSP
80.0000 mg | Freq: Once | INTRAMUSCULAR | Status: AC
  Administered 2016-04-15: 80 mg via INTRAMUSCULAR

## 2016-04-15 MED ORDER — METHYLPREDNISOLONE 4 MG PO TBPK: ORAL_TABLET | ORAL | 0 refills | Status: DC

## 2016-04-15 NOTE — Discharge Instructions (Signed)
Can continue to use claritin.  Can add OTC benadryl 25mg  before bedtime.

## 2016-04-15 NOTE — ED Triage Notes (Signed)
[facility] a [facility] of a rash on her arms and legs that started yesterday.  The patient also had a complaint of reflux.

## 2016-04-15 NOTE — ED Provider Notes (Signed)
[facility]    CSN: [id number] Arrival date & time: 04/15/16  1345  First Provider Contact:  First MD Initiated Contact with Patient 04/15/16 1733        History   Chief Complaint Chief Complaint  Patient presents with  . Rash  . Gastroesophageal Reflux    HPI Christina Ray is a 60 y.o. female.   HPI Patient comes in today with complaints of pruritic, burning rash on bilat arms and legs.  States that this started yesterday.  No affecting back, chest or abdomen.  No associated fever, chills.  States that she had applied two different creams/lotions that she has not used at the same time previously.  States that she also went out to visit her [place] who [place] the country and applied Off spray on arms and legs before going out.  No respiratory issues.   Past Medical History:  Diagnosis Date  . Asthma   . Chronic back pain   . Diverticulitis   . GERD (gastroesophageal reflux disease)   . Hypertension     Patient Active Problem List   Diagnosis Date Noted  . Right knee pain 06/29/2014  . Left knee pain 06/29/2014    Past Surgical History:  Procedure Laterality Date  . ABDOMINAL HYSTERECTOMY    . Arthroscopic knee surgery Right   . HAND SURGERY Right     OB History    No data available       Home Medications    Prior to Admission medications   Medication Sig Start Date End Date Taking? Authorizing Provider  atenolol (TENORMIN) 50 MG tablet Take 50 mg by mouth daily.   Yes Historical Provider, MD  lisinopril-hydrochlorothiazide (PRINZIDE,ZESTORETIC) 20-25 MG per tablet Take 1 tablet by mouth daily.   Yes Historical Provider, MD  aspirin EC 81 MG tablet Take 81 mg by mouth daily.    Historical Provider, MD  cephALEXin (KEFLEX) 500 MG capsule Take 1 capsule (500 mg total) by mouth 3 (three) times daily. 09/30/15   Tharon Aquas, PA  Dextromethorphan-Guaifenesin (CORICIDIN HBP CONGESTION/COUGH) 10-200 MG CAPS Take 1 tablet by mouth every 6 (six)  hours as needed (cough).    Historical Provider, MD  fluticasone (FLONASE) 50 MCG/ACT nasal spray Place 2 sprays into both nostrils daily.    Historical Provider, MD  HYDROcodone-acetaminophen (NORCO/VICODIN) 5-325 MG per tablet Take 1-2 tablets by mouth every 4 (four) hours as needed. 06/27/14   Elpidio Anis, PA-C  ibuprofen (ADVIL,MOTRIN) 800 MG tablet Take 1 tablet (800 mg total) by mouth 3 (three) times daily. 06/27/14   Elpidio Anis, PA-C  methylPREDNISolone (MEDROL DOSEPAK) 4 MG TBPK tablet 6 day taper to be taken as directed. 04/15/16   Naida Sleight, PA-C  Multiple Vitamin (MULTIVITAMIN WITH MINERALS) TABS tablet Take 1 tablet by mouth daily.    Historical Provider, MD    Family History History reviewed. No pertinent family history.  Social History Social History  Substance Use Topics  . Smoking status: Former Games developer  . Smokeless tobacco: Never Used  . Alcohol use No     Allergies   Review of patient's allergies indicates no known allergies.   Review of Systems Review of Systems  Constitutional: Negative.   HENT: Negative.   Eyes: Negative.   Respiratory: Negative.   Cardiovascular: Negative.   Gastrointestinal: Negative.   Genitourinary: Negative.   Musculoskeletal: Negative.   Skin: Positive for rash.  Neurological: Negative.   Psychiatric/Behavioral: Negative.  Physical Exam Triage Vital Signs ED Triage Vitals  Enc Vitals Group     BP 04/15/16 1606 157/71     Pulse Rate 04/15/16 1606 (!) 58     Resp 04/15/16 1606 16     Temp 04/15/16 1606 98 F (36.7 C)     Temp Source 04/15/16 1606 Oral     SpO2 04/15/16 1606 100 %     Weight --      Height --      Head Circumference --      Peak Flow --      Pain Score 04/15/16 1639 9     Pain Loc --      Pain Edu? --      Excl. in GC? --    No data found.   Updated Vital Signs BP 157/71 (BP Location: Left Arm)   Pulse (!) 58   Temp 98 F (36.7 C) (Oral)   Resp 16   SpO2 100%   Visual  Acuity Right Eye Distance:   Left Eye Distance:   Bilateral Distance:    Right Eye Near:   Left Eye Near:    Bilateral Near:     Physical Exam  Constitutional: She is oriented to person, place, and time. She appears well-nourished. No distress.  HENT:  Head: Normocephalic and atraumatic.  Eyes: EOM are normal. Pupils are equal, round, and reactive to light.  Neck: Normal range of motion.  Cardiovascular: Normal rate.   Pulmonary/Chest: Effort normal and breath sounds normal. No respiratory distress. She has no wheezes.  Musculoskeletal: Normal range of motion.  Neurological: She is alert and oriented to person, place, and time.  Skin: Skin is warm. Rash (macular. nontender. bilat arms worse than legs.  no signs of infection. ) noted. No erythema.  Psychiatric: She has a normal mood and affect. Her behavior is normal.     UC Treatments / Results  Labs (all labs ordered are listed, but only abnormal results are displayed) Labs Reviewed - No data to display  EKG  EKG Interpretation None       Radiology No results found.  Procedures Procedures (including critical care time)  Medications Ordered in UC Medications  methylPREDNISolone acetate (DEPO-MEDROL) injection 80 mg (not administered)     Initial Impression / Assessment and Plan / UC Course  I have reviewed the triage vital signs and the nursing notes.  Pertinent labs & imaging results that were available during my care of the patient were reviewed by me and considered in my medical decision making (see chart for details).  Clinical Course      Final Clinical Impressions(s) / UC Diagnoses   Final diagnoses:  Contact dermatitis    New Prescriptions New Prescriptions   METHYLPREDNISOLONE (MEDROL DOSEPAK) 4 MG TBPK TABLET    6 day taper to be taken as directed.  advised patient to continue using claritin.  Can use benadry 25mg  before bedtime if needed.  If rash worsens or not getting better over the next  couple of days she should be seen by PCP.  If spreading or any signs of respiratory issues go immediately to the ED.  All questions answered.     Naida SleightJames M Jaelynn Currier, PA-C 04/15/16 1758

## 2016-04-27 DIAGNOSIS — Z23 Encounter for immunization: Secondary | ICD-10-CM | POA: Diagnosis not present

## 2016-06-01 DIAGNOSIS — I10 Essential (primary) hypertension: Secondary | ICD-10-CM | POA: Diagnosis not present

## 2016-06-01 DIAGNOSIS — E784 Other hyperlipidemia: Secondary | ICD-10-CM | POA: Diagnosis not present

## 2016-06-01 DIAGNOSIS — R7309 Other abnormal glucose: Secondary | ICD-10-CM | POA: Diagnosis not present

## 2016-06-16 DIAGNOSIS — J029 Acute pharyngitis, unspecified: Secondary | ICD-10-CM | POA: Diagnosis not present

## 2016-06-16 DIAGNOSIS — J0140 Acute pansinusitis, unspecified: Secondary | ICD-10-CM | POA: Diagnosis not present

## 2016-06-25 DIAGNOSIS — J328 Other chronic sinusitis: Secondary | ICD-10-CM | POA: Diagnosis not present

## 2016-06-25 DIAGNOSIS — E668 Other obesity: Secondary | ICD-10-CM | POA: Diagnosis not present

## 2016-06-25 DIAGNOSIS — K219 Gastro-esophageal reflux disease without esophagitis: Secondary | ICD-10-CM | POA: Diagnosis not present

## 2016-06-25 DIAGNOSIS — Z0000 Encounter for general adult medical examination without abnormal findings: Secondary | ICD-10-CM | POA: Diagnosis not present

## 2016-06-25 DIAGNOSIS — Z1389 Encounter for screening for other disorder: Secondary | ICD-10-CM | POA: Diagnosis not present

## 2016-06-25 DIAGNOSIS — Z6841 Body Mass Index (BMI) 40.0 and over, adult: Secondary | ICD-10-CM | POA: Diagnosis not present

## 2016-06-25 DIAGNOSIS — E784 Other hyperlipidemia: Secondary | ICD-10-CM | POA: Diagnosis not present

## 2016-06-25 DIAGNOSIS — I10 Essential (primary) hypertension: Secondary | ICD-10-CM | POA: Diagnosis not present

## 2016-09-17 DIAGNOSIS — Z780 Asymptomatic menopausal state: Secondary | ICD-10-CM | POA: Diagnosis not present

## 2016-09-17 DIAGNOSIS — M179 Osteoarthritis of knee, unspecified: Secondary | ICD-10-CM | POA: Diagnosis not present

## 2016-10-29 ENCOUNTER — Encounter (HOSPITAL_COMMUNITY): Payer: Self-pay | Admitting: Emergency Medicine

## 2016-10-29 ENCOUNTER — Emergency Department (HOSPITAL_COMMUNITY): Payer: Medicare Other

## 2016-10-29 DIAGNOSIS — Z79899 Other long term (current) drug therapy: Secondary | ICD-10-CM | POA: Diagnosis not present

## 2016-10-29 DIAGNOSIS — E119 Type 2 diabetes mellitus without complications: Secondary | ICD-10-CM | POA: Diagnosis not present

## 2016-10-29 DIAGNOSIS — R0789 Other chest pain: Secondary | ICD-10-CM | POA: Diagnosis not present

## 2016-10-29 DIAGNOSIS — I10 Essential (primary) hypertension: Secondary | ICD-10-CM | POA: Diagnosis not present

## 2016-10-29 DIAGNOSIS — R079 Chest pain, unspecified: Secondary | ICD-10-CM | POA: Diagnosis not present

## 2016-10-29 DIAGNOSIS — Z87891 Personal history of nicotine dependence: Secondary | ICD-10-CM | POA: Insufficient documentation

## 2016-10-29 DIAGNOSIS — Z982 Presence of cerebrospinal fluid drainage device: Secondary | ICD-10-CM | POA: Insufficient documentation

## 2016-10-29 DIAGNOSIS — J45909 Unspecified asthma, uncomplicated: Secondary | ICD-10-CM | POA: Insufficient documentation

## 2016-10-29 DIAGNOSIS — R072 Precordial pain: Secondary | ICD-10-CM | POA: Diagnosis present

## 2016-10-29 NOTE — ED Notes (Signed)
Unable to collect labs patient in xray 

## 2016-10-29 NOTE — ED Triage Notes (Signed)
Pt states she has been having chest pain off and on for the past 3 days  Pt states it is in the center and it varies in intensity  Pt states it varies from mild to sharp and radiates out laterally under her breasts  Pt states she has been having some shortness of breath as well

## 2016-10-30 ENCOUNTER — Emergency Department (HOSPITAL_COMMUNITY)
Admission: EM | Admit: 2016-10-30 | Discharge: 2016-10-30 | Disposition: A | Discharge status: Home / Self Care | Payer: Medicare Other | Attending: Emergency Medicine | Admitting: Emergency Medicine

## 2016-10-30 DIAGNOSIS — R0789 Other chest pain: Secondary | ICD-10-CM

## 2016-10-30 LAB — I-STAT TROPONIN, ED
Troponin i, poc: 0 ng/mL (ref 0.00–0.08)
Troponin i, poc: 0 ng/mL (ref 0.00–0.08)

## 2016-10-30 LAB — BASIC METABOLIC PANEL
Anion gap: 7 (ref 5–15)
BUN: 19 mg/dL (ref 6–20)
CO2: 26 mmol/L (ref 22–32)
Calcium: 9.6 mg/dL (ref 8.9–10.3)
Chloride: 104 mmol/L (ref 101–111)
Creatinine, Ser: 1.02 mg/dL — ABNORMAL HIGH (ref 0.44–1.00)
GFR calc Af Amer: >60 mL/min (ref >60)
GFR calc non Af Amer: 58 mL/min — ABNORMAL LOW (ref >60)
Glucose, Bld: 99 mg/dL (ref 65–99)
Potassium: 3.7 mmol/L (ref 3.5–5.1)
Sodium: 137 mmol/L (ref 135–145)

## 2016-10-30 LAB — CBC
HCT: 37.8 % (ref 36.0–46.0)
Hemoglobin: 12.7 g/dL (ref 12.0–15.0)
MCH: 29.6 pg (ref 26.0–34.0)
MCHC: 33.6 g/dL (ref 30.0–36.0)
MCV: 88.1 fL (ref 78.0–100.0)
Platelets: 257 10*3/uL (ref 150–400)
RBC: 4.29 MIL/uL (ref 3.87–5.11)
RDW: 13.2 % (ref 11.5–15.5)
WBC: 5.1 10*3/uL (ref 4.0–10.5)

## 2016-10-30 NOTE — ED Provider Notes (Signed)
TIME SEEN: 3:30 AM  10/30/2016  CHIEF COMPLAINT: Chest pain  HPI: This is a 61 year old female with history of hypertension and diabetes who presents to the ED for evaluation of chest pain. This patient states that over the last 3 days she has experienced "sharp" sternal chest pain that presents over a period of "seconds". She also experiences 15 minute episodes of overlying chest "tightness" with dyspnea. Not exertional. She states exertion actually helps her pain. Non-radiating. No particular inciting factors. No alleviating factors. No nausea or diaphoresis. No near syncope. No history of DVT/PE. Stress test "many years ago"; no prior L heart caths.   ROS: See HPI Constitutional: no fever  Eyes: no drainage  ENT: no runny nose   Cardiovascular:  + chest pain  Resp: + dyspnea  GI: no vomiting GU: no dysuria Integumentary: no rash  Allergy: no hives  Musculoskeletal: no leg swelling  Neurological: no slurred speech ROS otherwise negative  PAST MEDICAL HISTORY/PAST SURGICAL HISTORY:  Past Medical History:  Diagnosis Date  . Asthma   . Chronic back pain   . Diverticulitis   . GERD (gastroesophageal reflux disease)   . Hypertension     MEDICATIONS:  Prior to Admission medications   Medication Sig Start Date End Date Taking? Authorizing Provider  aspirin EC 81 MG tablet Take 81 mg by mouth daily.    Historical Provider, MD  atenolol (TENORMIN) 50 MG tablet Take 50 mg by mouth daily.    Historical Provider, MD  cephALEXin (KEFLEX) 500 MG capsule Take 1 capsule (500 mg total) by mouth 3 (three) times daily. 09/30/15   Tharon Aquas, PA  Dextromethorphan-Guaifenesin (CORICIDIN HBP CONGESTION/COUGH) 10-200 MG CAPS Take 1 tablet by mouth every 6 (six) hours as needed (cough).    Historical Provider, MD  fluticasone (FLONASE) 50 MCG/ACT nasal spray Place 2 sprays into both nostrils daily.    Historical Provider, MD  HYDROcodone-acetaminophen (NORCO/VICODIN) 5-325 MG per tablet Take 1-2  tablets by mouth every 4 (four) hours as needed. 06/27/14   Elpidio Anis, PA-C  ibuprofen (ADVIL,MOTRIN) 800 MG tablet Take 1 tablet (800 mg total) by mouth 3 (three) times daily. 06/27/14   Elpidio Anis, PA-C  lisinopril-hydrochlorothiazide (PRINZIDE,ZESTORETIC) 20-25 MG per tablet Take 1 tablet by mouth daily.    Historical Provider, MD  methylPREDNISolone (MEDROL DOSEPAK) 4 MG TBPK tablet 6 day taper to be taken as directed. 04/15/16   Naida Sleight, PA-C  Multiple Vitamin (MULTIVITAMIN WITH MINERALS) TABS tablet Take 1 tablet by mouth daily.    Historical Provider, MD    ALLERGIES:  No Known Allergies  SOCIAL HISTORY:  Social History  Substance Use Topics  . Smoking status: Former Games developer  . Smokeless tobacco: Never Used  . Alcohol use No    FAMILY HISTORY: Family History  Problem Relation Age of Onset  . Hypertension Other   . Cancer Other     EXAM: BP (!) 150/92 (BP Location: Left Arm)   Pulse (!) 54   Temp 97.7 F (36.5 C) (Oral)   Resp 18   Ht  (1.803 m)   Wt (!) 310 lb (140.6 kg)   SpO2 100%   BMI 43.24 kg/m  CONSTITUTIONAL: Alert and oriented and responds appropriately to questions. Well-appearing; well-nourished HEAD: Normocephalic EYES: Conjunctivae clear, pupils appear equal, EOMI ENT: normal nose; moist mucous membranes NECK: Supple, no meningismus, no nuchal rigidity, no LAD  CARD: RRR; S1 and S2 appreciated; no murmurs, no clicks, 32no rubs, no  gallops RESP: Normal chest excursion without splinting or tachypnea; breath sounds clear and equal bilaterally; no wheezes, no rhonchi, no rales, no hypoxia or respiratory distress, speaking full sentences ABD/GI: Normal bowel sounds; non-distended; soft, non-tender, no rebound, no guarding, no peritoneal signs, no hepatosplenomegaly BACK:  The back appears normal and is non-tender to palpation, there is no CVA tenderness EXT: Normal ROM in all joints; non-tender to palpation; no edema; normal capillary refill; no  cyanosis, no calf tenderness or swelling    SKIN: Normal color for age and race; warm; no rash NEURO: Moves all extremities equally PSYCH: The patient's mood and manner are appropriate. Grooming and personal hygiene are appropriate.  MEDICAL DECISION MAKING: Patient here with atypical chest pain and shortness of breath. She has had 2 negative troponins. Pain-free at this time. Chest x-ray clear. EKG shows no ischemic abnormality. I feel patient is safe to be discharged home with outpatient follow-up with her primary care provider to discuss outpatient stress test. She is comfortable with this plan.  HEART score 2. Doubt PE. Doubt dissection. No signs of pneumonia or pneumothorax. No signs of volume overload. Doubt pericarditis.  At this time, I do not feel there is any life-threatening condition present. I have reviewed and discussed all results (EKG, imaging, lab, urine as appropriate) and exam findings with patient/family. I have reviewed nursing notes and appropriate previous records.  I feel the patient is safe to be discharged home without further emergent workup and can continue workup as an outpatient as needed. Discussed usual and customary return precautions. Patient/family verbalize understanding and are comfortable with this plan.  Outpatient follow-up has been provided if needed. All questions have been answered.    EKG Interpretation  Date/Time:  Monday October 29 2016 23:39:14 EDT Ventricular Rate:  53 PR Interval:    QRS Duration: 81 QT Interval:  433 QTC Calculation: 407 R Axis:   -13 Text Interpretation:  Sinus rhythm No significant change since last tracing Confirmed by Chetara Kropp,  DO, Remy Dia ([id number]) on 10/30/2016 12:25:42 AM Also confirmed by Liberty Seto,  DO, Lauris Serviss ([id number]), editor [place]  [facility], BEVERLY ([id number])  on 10/30/2016 7:09:37 AM        I personally performed the services described in this documentation, which was scribed in my presence. The recorded information has been  reviewed and is accurate.    Layla Maw Rionna Feltes, DO 10/30/16 [phone]

## 2017-03-22 DIAGNOSIS — I10 Essential (primary) hypertension: Secondary | ICD-10-CM | POA: Diagnosis not present

## 2017-03-22 DIAGNOSIS — S61001A Unspecified open wound of right thumb without damage to nail, initial encounter: Secondary | ICD-10-CM | POA: Diagnosis not present

## 2017-04-29 ENCOUNTER — Encounter: Payer: Self-pay | Admitting: Family Medicine

## 2017-04-29 ENCOUNTER — Ambulatory Visit (INDEPENDENT_AMBULATORY_CARE_PROVIDER_SITE_OTHER): Payer: Medicare Other | Admitting: Family Medicine

## 2017-04-29 ENCOUNTER — Encounter: Payer: Self-pay | Admitting: Psychology

## 2017-04-29 VITALS — BP 134/84 | HR 56 | Temp 98.1°F | Ht 71.0 in | Wt 303.0 lb

## 2017-04-29 DIAGNOSIS — Z23 Encounter for immunization: Secondary | ICD-10-CM

## 2017-04-29 DIAGNOSIS — J302 Other seasonal allergic rhinitis: Secondary | ICD-10-CM | POA: Diagnosis not present

## 2017-04-29 DIAGNOSIS — F411 Generalized anxiety disorder: Secondary | ICD-10-CM

## 2017-04-29 DIAGNOSIS — K219 Gastro-esophageal reflux disease without esophagitis: Secondary | ICD-10-CM

## 2017-04-29 DIAGNOSIS — I10 Essential (primary) hypertension: Secondary | ICD-10-CM

## 2017-04-29 MED ORDER — LISINOPRIL-HYDROCHLOROTHIAZIDE 20-25 MG PO TABS: 1.0000 | ORAL_TABLET | Freq: Every day | ORAL | 2 refills | Status: DC

## 2017-04-29 MED ORDER — ATENOLOL 50 MG PO TABS: 50.0000 mg | ORAL_TABLET | Freq: Every day | ORAL | 2 refills | Status: DC

## 2017-04-29 NOTE — Progress Notes (Signed)
    Subjective:    Patient ID: Christina Ray, female    DOB: [birth year], 61 y.o.   MRN: [id number]   CC: here to establish care  PMH- GERD, HTN, arthritis, "irregular heartbeat" Meds- claritin, atenolol, lisinopril-hctz Allergies- seasonal Surg Hx- partial hysterectomy, right knee surgery FH- brother w/ bladder cancer, anxiety/depression, father w/ cancer, HTN, CKD, osteoporosis, mother with asthma  SH- lives with daughter, former smoker, rare alcohol use, no drug use. Works for [employer].   Mother passed away unexpectedly in 12-05-22. Patient grieving from this loss. Has a daughter who causes a lot stress in her life. Per patient her daughter leads a lifestyle she does not agree with and it causes her to worry and have anxiety. They live together.  Has taken atenolol since age [age] for HTN. Endorses palpitations at times. Denies CP, SOB.   Smoking status reviewed- former smoker  Review of Systems- see HPI   Objective:  BP 134/84   Pulse (!) 56   Temp 98.1 F (36.7 C) (Oral)   Ht  (1.803 m)   Wt (!) 303 lb (137.4 kg)   SpO2 98%   BMI 42.26 kg/m  Vitals and nursing note reviewed  General: well nourished, in no acute distress HEENT: normocephalic, no scleral icterus or conjunctival pallor, no nasal discharge, allergic crease present, moist mucous membranes, good dentition without erythema or discharge noted in posterior oropharynx Neck: supple, non-tender, without lymphadenopathy Cardiac: RRR, clear S1 and S2, no murmurs, rubs, or gallops Respiratory: clear to auscultation bilaterally, no increased work of breathing Extremities: no edema or cyanosis Skin: warm and dry, no rashes noted Neuro: alert and oriented, no focal deficits  Assessment & Plan:    Essential hypertension  Chronic, well controlled. At goal today <140/90  -refilled lisinopril-HCTZ and atenolol -will obtain records from prior PCP, defer labs until then -follow up 3 months  Anxiety  state  Patient scored 20 on GAD-7 in office. Sites work stress, daughter's lifestyle, and recent passing of mother as sources of anxiety.  -warm hand-off today to [facility], please see their documentation -discussed treatment options at length with patient including buspar and SSRI. Patient preferred to think about this and instructed to call if she would like to initiate medication -follow up with me 3 months or sooner if needed  Records requested from PCP office, will review recent labs and repeat based on results obtained from their office.  Return in about 3 months (around 07/30/2017).   Dolores Patty, DO Family Medicine Resident PGY-2

## 2017-04-29 NOTE — Progress Notes (Signed)
Dr. Wonda Olds requested a Behavioral Health Consult.   Presenting Issue:  Anxiety  Report of symptoms:  Sleep disturbance, worry, fatigue  Duration of CURRENT symptoms:  Since April of 2018  Age of onset of first mood disturbance:  current  Impact on function:  She is tired all the time, but does what she needs to do for work etc.  Psychiatric History - Diagnoses: none - Hospitalizations: none - Pharmacotherapy: none - Outpatient therapy: none  Family history of psychiatric issues:  Brother has depression  Current and history of substance use:  None; denies using alcohol, marijuana or any other substance.  Medical conditions that might explain or contribute to symptoms:  Thyroid function is being evaluated.  PHQ-9:  Given by Dr. Wonda Olds (negative per Dr. Wonda Olds) GAD-7:  Given by Dr. Wonda Olds (severe per Dr. Wonda Olds) MDQ (if indicated):  Not given.  Assessment / Plan / Recommendations: This patient is grieving the death of her mother, has conflict with her daughter, and has financial and job-related worries. We practiced diaphragmatic breathing (DB); Shakaya had difficulty with breathing from her abdomen so we will continue to practice this and will also work on progressive muscle relaxation when she returns. She will practice DB and try to do things to make herself feel better, such as taking a warm bath. Alphonsine will return in one week on October 15 at 11:00 a.m.  Warmhandoff complete.

## 2017-04-29 NOTE — Assessment & Plan Note (Signed)
Assessment / Plan / Recommendations: This patient is grieving the death of her mother, has conflict with her daughter, and has financial and job-related worries. We practiced diaphragmatic breathing (DB); Emerita had difficulty with breathing from her abdomen so we will continue to practice this and will also work on progressive muscle relaxation when she returns. She will practice DB and try to do things to make herself feel better, such as taking a warm bath. Leniya will return in one week on October 15 at 11:00 a.m.

## 2017-04-29 NOTE — Assessment & Plan Note (Addendum)
  Chronic, well controlled. At goal today <140/90  -refilled lisinopril-HCTZ and atenolol -will obtain records from prior PCP, defer labs until then -follow up 3 months

## 2017-04-29 NOTE — Patient Instructions (Addendum)
It was great seeing you today!  I'll let you know when we get your records what needs to be done lab-wise.  Please give me a call if you would like to discuss starting medications at any time. Otherwise, let's plan on seeing you back in 3 months for follow up.   If you have questions or concerns please do not hesitate to call at [phone].  Dolores Patty, DO PGY-2, [facility] 04/29/2017 11:13 AM   Dealing with stress Stress is your body's reaction to life changes and events, both good and bad. Stress can last just a few hours or it can be ongoing. Stress can play a major role in anxiety, so it is important to learn both how to cope with stress and how to think about it differently. Talk with your health care provider or a counselor to learn more about stress reduction. He or she may suggest some stress reduction techniques, such as:  Music therapy. This can include creating or listening to music that you enjoy and that inspires you.  Mindfulness-based meditation. This involves being aware of your normal breaths, rather than trying to control your breathing. It can be done while sitting or walking.  Centering prayer. This is a kind of meditation that involves focusing on a word, phrase, or sacred image that is meaningful to you and that brings you peace.  Deep breathing. To do this, expand your stomach and inhale slowly through your nose. Hold your breath for 3-5 seconds. Then exhale slowly, allowing your stomach muscles to relax.  Self-talk. This is a skill where you identify thought patterns that lead to anxiety reactions and correct those thoughts.  Muscle relaxation. This involves tensing muscles then relaxing them.  Choose a stress reduction technique that fits your lifestyle and personality. Stress reduction techniques take time and practice. Set aside 5-15 minutes a day to do them. Therapists can offer training in these techniques. The training may be covered  by some insurance plans. Other things you can do to manage stress include:  Keeping a stress diary. This can help you learn what triggers your stress and ways to control your response.  Thinking about how you respond to certain situations. You may not be able to control everything, but you can control your reaction.  Making time for activities that help you relax, and not feeling guilty about spending your time in this way.  Therapy combined with coping and stress-reduction skills provides the best chance for successful treatment. Medicines Medicines can help ease symptoms. Medicines for anxiety include:  Anti-anxiety drugs.  Antidepressants.  Beta-blockers.  Medicines may be used as the main treatment for anxiety disorder, along with therapy, or if other treatments are not working. Medicines should be prescribed by a health care provider. Relationships Relationships can play a big part in helping you recover. Try to spend more time connecting with trusted friends and family members. Consider going to couples counseling, taking family education classes, or going to family therapy. Therapy can help you and others better understand the condition. How to recognize changes in your condition Everyone has a different response to treatment for anxiety. Recovery from anxiety happens when symptoms decrease and stop interfering with your daily activities at home or work. This may mean that you will start to:  Have better concentration and focus.  Sleep better.  Be less irritable.  Have more energy.  Have improved memory.  It is important to recognize when your condition is getting worse. Contact  your health care provider if your symptoms interfere with home or work and you do not feel like your condition is improving. Where to find help and support: You can get help and support from these sources:  Self-help groups.  Online and [employer].  A trusted spiritual  leader.  Couples counseling.  Family education classes.  Family therapy.  Follow these instructions at home:  Eat a healthy diet that includes plenty of vegetables, fruits, whole grains, low-fat dairy products, and lean protein. Do not eat a lot of foods that are high in solid fats, added sugars, or salt.  Exercise. Most adults should do the following: ? Exercise for at least 150 minutes each week. The exercise should increase your heart rate and make you sweat (moderate-intensity exercise). ? Strengthening exercises at least twice a week.  Cut down on caffeine, tobacco, alcohol, and other potentially harmful substances.  Get the right amount and quality of sleep. Most adults need 7-9 hours of sleep each night.  Make choices that simplify your life.  Take over-the-counter and prescription medicines only as told by your health care provider.  Avoid caffeine, alcohol, and certain over-the-counter cold medicines. These may make you feel worse. Ask your pharmacist which medicines to avoid.  Keep all follow-up visits as told by your health care provider. This is important. Questions to ask your health care provider  Would I benefit from therapy?  How often should I follow up with a health care provider?  How long do I need to take medicine?  Are there any long-term side effects of my medicine?  Are there any alternatives to taking medicine? Contact a health care provider if:  You have a hard time staying focused or finishing daily tasks.  You spend many hours a day feeling worried about everyday life.  You become exhausted by worry.  You start to have headaches, feel tense, or have nausea.  You urinate more than normal.  You have diarrhea. Get help right away if:  You have a racing heart and shortness of breath.  You have thoughts of hurting yourself or others. If you ever feel like you may hurt yourself or others, or have thoughts about taking your own life, get  help right away. You can go to your nearest emergency department or call:  Your local emergency services (911 in the [place]  A suicide crisis helpline, such as the National Suicide Prevention Lifeline at 810-336-4678. This is open 24-hours a day.  Summary  Taking steps to deal with stress can help calm you.  Medicines cannot cure anxiety disorders, but they can help ease symptoms.  Family, friends, and partners can play a big part in helping you recover from an anxiety disorder. This information is not intended to replace advice given to you by your health care provider. Make sure you discuss any questions you have with your health care provider. Document Released: 07/03/2016 Document Revised: 07/03/2016 Document Reviewed: 07/03/2016 Elsevier Interactive Patient Education  [employer].

## 2017-04-29 NOTE — Assessment & Plan Note (Signed)
  Patient scored 20 on GAD-7 in office. Sites work stress, daughter's lifestyle, and recent passing of mother as sources of anxiety.  -warm hand-off today to [facility], please see their documentation -discussed treatment options at length with patient including buspar and SSRI. Patient preferred to think about this and instructed to call if she would like to initiate medication -follow up with me 3 months or sooner if needed

## 2017-05-06 ENCOUNTER — Ambulatory Visit: Payer: Medicare Other

## 2017-05-13 ENCOUNTER — Ambulatory Visit: Payer: Medicare Other

## 2017-05-20 ENCOUNTER — Ambulatory Visit: Payer: Medicare Other

## 2017-05-20 ENCOUNTER — Encounter: Payer: Self-pay | Admitting: Psychology

## 2017-05-20 NOTE — Progress Notes (Signed)
Christina Ray did not show up for her appointment today so I called to check on her. Unfortunately, her voice mail is not set up to take messages.

## 2017-06-08 ENCOUNTER — Ambulatory Visit (HOSPITAL_COMMUNITY)
Admission: EM | Admit: 2017-06-08 | Discharge: 2017-06-08 | Disposition: A | Discharge status: Home / Self Care | Payer: Medicare Other | Attending: Family Medicine | Admitting: Family Medicine

## 2017-06-08 ENCOUNTER — Encounter (HOSPITAL_COMMUNITY): Payer: Self-pay | Admitting: Family Medicine

## 2017-06-08 DIAGNOSIS — R109 Unspecified abdominal pain: Secondary | ICD-10-CM

## 2017-06-08 DIAGNOSIS — R110 Nausea: Secondary | ICD-10-CM

## 2017-06-08 MED ORDER — ONDANSETRON 4 MG PO TBDP: 4.0000 mg | ORAL_TABLET | Freq: Three times a day (TID) | ORAL | 0 refills | Status: DC | PRN

## 2017-06-08 NOTE — ED Triage Notes (Signed)
Pt here for sinus congestion since yesterday. Reports also that she had some abd pain the last 2 days but not today.

## 2017-06-18 NOTE — ED Provider Notes (Signed)
[facility]   [id number] 06/08/17 Arrival Time: 1653  ASSESSMENT & PLAN:  1. Abdominal discomfort   2. Nausea     Meds ordered this encounter  Medications  . ondansetron (ZOFRAN-ODT) 4 MG disintegrating tablet    Sig: Take 1 tablet (4 mg total) every 8 (eight) hours as needed by mouth for nausea or vomiting.    Dispense:  15 tablet    Refill:  0   Zofran if needed. Appetite returning. Benign exam. Observation. May return if needed or if symptoms recur. Reviewed expectations re: course of current medical issues. Questions answered. Outlined signs and symptoms indicating need for more acute intervention. Patient verbalized understanding. After Visit Summary given.   SUBJECTIVE:  Christina Ray is a 61 y.o. female who presents with complaint of abdominal discomfort. Onset gradual, 1-2 days ago. Described as dull. Location: lower abdomen without radiation. Described symptoms are completely resolved since beginning. Aggravating factors: none. Alleviating factors: none. Associated symptoms: mild nausea without emesis but causing decreased PO intake until today. The patient denies anorexia, chills, constipation, diarrhea, dysuria, fever, hematochezia and hematuria. Appetite: "better" today. PO intake: decreased. Ambulatory without assistance. Urinary symptoms: none. Last bowel movement two days ago without blood. OTC treatment: None.  No LMP recorded.   Past Surgical History:  Procedure Laterality Date  . ABDOMINAL HYSTERECTOMY    . Arthroscopic knee surgery Right   . HAND SURGERY Right     ROS: As per HPI.  OBJECTIVE:  Vitals:   06/08/17 1711  BP: (!) 144/59  Pulse: 74  Resp: 18  Temp: 98 F (36.7 C)  SpO2: 100%    General appearance: alert; no distress Lungs: clear to auscultation bilaterally Heart: regular rate and rhythm Abdomen: soft; non-distended; no tenderness; bowel sounds present; no masses or organomegaly; no guarding or rebound  tenderness Back: no CVA tenderness Extremities: no edema; symmetrical with no gross deformities Skin: warm and dry Neurologic: normal gait Psychological: alert and cooperative; normal mood and affect   No Known Allergies                                             Past Medical History:  Diagnosis Date  . Asthma   . Chronic back pain   . Diverticulitis   . GERD (gastroesophageal reflux disease)   . Hypertension    Social History   Socioeconomic History  . Marital status: Divorced    Spouse name: Not on file  . Number of children: Not on file  . Years of education: Not on file  . Highest education level: Not on file  Social Needs  . Financial resource strain: Not on file  . Food insecurity - worry: Not on file  . Food insecurity - inability: Not on file  . Transportation needs - medical: Not on file  . Transportation needs - non-medical: Not on file  Occupational History  . Not on file  Tobacco Use  . Smoking status: Former Games developermoker  . Smokeless tobacco: Never Used  Substance and Sexual Activity  . Alcohol use: No  . Drug use: No  . Sexual activity: Yes    Birth control/protection: Surgical  Other Topics Concern  . Not on file  Social History Narrative  . Not on file   Family History  Problem Relation Age of Onset  . Hypertension Other   . Cancer Other  Mardella LaymanHagler, Noor Vidales, MD 06/18/17 [id number]

## 2017-06-21 ENCOUNTER — Ambulatory Visit (INDEPENDENT_AMBULATORY_CARE_PROVIDER_SITE_OTHER): Payer: Medicare Other | Admitting: Family Medicine

## 2017-06-21 ENCOUNTER — Encounter: Payer: Self-pay | Admitting: Family Medicine

## 2017-06-21 ENCOUNTER — Other Ambulatory Visit: Payer: Self-pay

## 2017-06-21 VITALS — BP 124/82 | HR 65 | Temp 98.0°F | Resp 14 | Ht 71.0 in | Wt 303.0 lb

## 2017-06-21 DIAGNOSIS — I10 Essential (primary) hypertension: Secondary | ICD-10-CM | POA: Diagnosis not present

## 2017-06-21 DIAGNOSIS — F419 Anxiety disorder, unspecified: Secondary | ICD-10-CM | POA: Diagnosis not present

## 2017-06-21 DIAGNOSIS — Z0000 Encounter for general adult medical examination without abnormal findings: Secondary | ICD-10-CM

## 2017-06-21 DIAGNOSIS — R7309 Other abnormal glucose: Secondary | ICD-10-CM | POA: Diagnosis not present

## 2017-06-21 DIAGNOSIS — R002 Palpitations: Secondary | ICD-10-CM

## 2017-06-21 DIAGNOSIS — Z1159 Encounter for screening for other viral diseases: Secondary | ICD-10-CM | POA: Diagnosis not present

## 2017-06-21 DIAGNOSIS — Z114 Encounter for screening for human immunodeficiency virus [HIV]: Secondary | ICD-10-CM

## 2017-06-21 DIAGNOSIS — F411 Generalized anxiety disorder: Secondary | ICD-10-CM | POA: Diagnosis not present

## 2017-06-21 DIAGNOSIS — Z1239 Encounter for other screening for malignant neoplasm of breast: Secondary | ICD-10-CM

## 2017-06-21 DIAGNOSIS — R350 Frequency of micturition: Secondary | ICD-10-CM

## 2017-06-21 DIAGNOSIS — Z1231 Encounter for screening mammogram for malignant neoplasm of breast: Secondary | ICD-10-CM | POA: Diagnosis not present

## 2017-06-21 DIAGNOSIS — Z1211 Encounter for screening for malignant neoplasm of colon: Secondary | ICD-10-CM

## 2017-06-21 LAB — POCT URINALYSIS DIP (MANUAL ENTRY)
Bilirubin, UA: NEGATIVE
Blood, UA: NEGATIVE
Glucose, UA: NEGATIVE mg/dL
Ketones, POC UA: NEGATIVE mg/dL
Leukocytes, UA: NEGATIVE
Nitrite, UA: NEGATIVE
Protein Ur, POC: NEGATIVE mg/dL
Spec Grav, UA: 1.02 (ref 1.010–1.025)
Urobilinogen, UA: 0.2 E.U./dL
pH, UA: 5.5 (ref 5.0–8.0)

## 2017-06-21 LAB — POCT GLYCOSYLATED HEMOGLOBIN (HGB A1C): Hemoglobin A1C: 5.4

## 2017-06-21 MED ORDER — HYDROXYZINE HCL 25 MG PO TABS: 25.0000 mg | ORAL_TABLET | Freq: Three times a day (TID) | ORAL | 2 refills | Status: DC | PRN

## 2017-06-21 NOTE — Patient Instructions (Addendum)
It was great seeing you today!  I sent in hydroxyzine to your pharmacy to take up to 3 times a day as needed for anxiety. Please let me know if you have difficulties affording or taking this medication.  I referred you to cardiology, you will get a call from our office to schedule an appointment.  Please follow up about 6 months to see how things are going or sooner if needed.  If you have questions or concerns please do not hesitate to call at [phone].  Lucila Maine, DO PGY-2, [facility] 06/21/2017 2:25 PM    Health Maintenance, Female Adopting a healthy lifestyle and getting preventive care can go a long way to promote health and wellness. Talk with your health care provider about what schedule of regular examinations is right for you. This is a good chance for you to check in with your provider about disease prevention and staying healthy. In between checkups, there are plenty of things you can do on your own. Experts have done a lot of research about which lifestyle changes and preventive measures are most likely to keep you healthy. Ask your health care provider for more information. Weight and diet Eat a healthy diet  Be sure to include plenty of vegetables, fruits, low-fat dairy products, and lean protein.  Do not eat a lot of foods high in solid fats, added sugars, or salt.  Get regular exercise. This is one of the most important things you can do for your health. ? Most adults should exercise for at least 150 minutes each week. The exercise should increase your heart rate and make you sweat (moderate-intensity exercise). ? Most adults should also do strengthening exercises at least twice a week. This is in addition to the moderate-intensity exercise.  Maintain a healthy weight  Body mass index (BMI) is a measurement that can be used to identify possible weight problems. It estimates body fat based on height and weight. Your health care provider can  help determine your BMI and help you achieve or maintain a healthy weight.  For females 28 years of age and older: ? A BMI below 18.5 is considered underweight. ? A BMI of 18.5 to 24.9 is normal. ? A BMI of 25 to 29.9 is considered overweight. ? A BMI of 30 and above is considered obese.  Watch levels of cholesterol and blood lipids  You should start having your blood tested for lipids and cholesterol at 62 years of age, then have this test every 5 years.  You may need to have your cholesterol levels checked more often if: ? Your lipid or cholesterol levels are high. ? You are older than 62 years of age. ? You are at high risk for heart disease.  Cancer screening Lung Cancer  Lung cancer screening is recommended for adults 7-54 years old who are at high risk for lung cancer because of a history of smoking.  A yearly low-dose CT scan of the lungs is recommended for people who: ? Currently smoke. ? Have quit within the past 15 years. ? Have at least a 30-pack-year history of smoking. A pack year is smoking an average of one pack of cigarettes a day for 1 year.  Yearly screening should continue until it has been 15 years since you quit.  Yearly screening should stop if you develop a health problem that would prevent you from having lung cancer treatment.  Breast Cancer  Practice breast self-awareness. This means understanding how your  breasts normally appear and feel.  It also means doing regular breast self-exams. Let your health care provider know about any changes, no matter how small.  If you are in your 20s or 30s, you should have a clinical breast exam (CBE) by a health care provider every 1-3 years as part of a regular health exam.  If you are 72 or older, have a CBE every year. Also consider having a breast X-ray (mammogram) every year.  If you have a family history of breast cancer, talk to your health care provider about genetic screening.  If you are at high risk  for breast cancer, talk to your health care provider about having an MRI and a mammogram every year.  Breast cancer gene (BRCA) assessment is recommended for women who have family members with BRCA-related cancers. BRCA-related cancers include: ? Breast. ? Ovarian. ? Tubal. ? Peritoneal cancers.  Results of the assessment will determine the need for genetic counseling and BRCA1 and BRCA2 testing.  Cervical Cancer Your health care provider may recommend that you be screened regularly for cancer of the pelvic organs (ovaries, uterus, and vagina). This screening involves a pelvic examination, including checking for microscopic changes to the surface of your cervix (Pap test). You may be encouraged to have this screening done every 3 years, beginning at age 65.  For women ages 82-65, health care providers may recommend pelvic exams and Pap testing every 3 years, or they may recommend the Pap and pelvic exam, combined with testing for human papilloma virus (HPV), every 5 years. Some types of HPV increase your risk of cervical cancer. Testing for HPV may also be done on women of any age with unclear Pap test results.  Other health care providers may not recommend any screening for nonpregnant women who are considered low risk for pelvic cancer and who do not have symptoms. Ask your health care provider if a screening pelvic exam is right for you.  If you have had past treatment for cervical cancer or a condition that could lead to cancer, you need Pap tests and screening for cancer for at least 20 years after your treatment. If Pap tests have been discontinued, your risk factors (such as having a new sexual partner) need to be reassessed to determine if screening should resume. Some women have medical problems that increase the chance of getting cervical cancer. In these cases, your health care provider may recommend more frequent screening and Pap tests.  Colorectal Cancer  This type of cancer can be  detected and often prevented.  Routine colorectal cancer screening usually begins at 62 years of age and continues through 62 years of age.  Your health care provider may recommend screening at an earlier age if you have risk factors for colon cancer.  Your health care provider may also recommend using home test kits to check for hidden blood in the stool.  A small camera at the end of a tube can be used to examine your colon directly (sigmoidoscopy or colonoscopy). This is done to check for the earliest forms of colorectal cancer.  Routine screening usually begins at age 78.  Direct examination of the colon should be repeated every 5-10 years through 62 years of age. However, you may need to be screened more often if early forms of precancerous polyps or small growths are found.  Skin Cancer  Check your skin from head to toe regularly.  Tell your health care provider about any new moles or changes in  moles, especially if there is a change in a mole's shape or color.  Also tell your health care provider if you have a mole that is larger than the size of a pencil eraser.  Always use sunscreen. Apply sunscreen liberally and repeatedly throughout the day.  Protect yourself by wearing long sleeves, pants, a wide-brimmed hat, and sunglasses whenever you are outside.  Heart disease, diabetes, and high blood pressure  High blood pressure causes heart disease and increases the risk of stroke. High blood pressure is more likely to develop in: ? People who have blood pressure in the high end of the normal range (130-139/85-89 mm Hg). ? People who are overweight or obese. ? People who are African American.  If you are 67-40 years of age, have your blood pressure checked every 3-5 years. If you are 38 years of age or older, have your blood pressure checked every year. You should have your blood pressure measured twice-once when you are at a hospital or clinic, and once when you are not at a  hospital or clinic. Record the average of the two measurements. To check your blood pressure when you are not at a hospital or clinic, you can use: ? An automated blood pressure machine at a pharmacy. ? A home blood pressure monitor.  If you are between 37 years and 66 years old, ask your health care provider if you should take aspirin to prevent strokes.  Have regular diabetes screenings. This involves taking a blood sample to check your fasting blood sugar level. ? If you are at a normal weight and have a low risk for diabetes, have this test once every three years after 62 years of age. ? If you are overweight and have a high risk for diabetes, consider being tested at a younger age or more often. Preventing infection Hepatitis B  If you have a higher risk for hepatitis B, you should be screened for this virus. You are considered at high risk for hepatitis B if: ? You were born in a country where hepatitis B is common. Ask your health care provider which countries are considered high risk. ? Your parents were born in a high-risk country, and you have not been immunized against hepatitis B (hepatitis B vaccine). ? You have HIV or AIDS. ? You use needles to inject street drugs. ? You live with someone who has hepatitis B. ? You have had sex with someone who has hepatitis B. ? You get hemodialysis treatment. ? You take certain medicines for conditions, including cancer, organ transplantation, and autoimmune conditions.  Hepatitis C  Blood testing is recommended for: ? Everyone born from [birth year] through 1966. ? Anyone with known risk factors for hepatitis C.  Sexually transmitted infections (STIs)  You should be screened for sexually transmitted infections (STIs) including gonorrhea and chlamydia if: ? You are sexually active and are younger than 62 years of age. ? You are older than 62 years of age and your health care provider tells you that you are at risk for this type of  infection. ? Your sexual activity has changed since you were last screened and you are at an increased risk for chlamydia or gonorrhea. Ask your health care provider if you are at risk.  If you do not have HIV, but are at risk, it may be recommended that you take a prescription medicine daily to prevent HIV infection. This is called pre-exposure prophylaxis (PrEP). You are considered at risk if: ? You are  sexually active and do not regularly use condoms or know the HIV status of your partner(s). ? You take drugs by injection. ? You are sexually active with a partner who has HIV.  Talk with your health care provider about whether you are at high risk of being infected with HIV. If you choose to begin PrEP, you should first be tested for HIV. You should then be tested every 3 months for as long as you are taking PrEP. Pregnancy  If you are premenopausal and you may become pregnant, ask your health care provider about preconception counseling.  If you may become pregnant, take 400 to 800 micrograms (mcg) of folic acid every day.  If you want to prevent pregnancy, talk to your health care provider about birth control (contraception). Osteoporosis and menopause  Osteoporosis is a disease in which the bones lose minerals and strength with aging. This can result in serious bone fractures. Your risk for osteoporosis can be identified using a bone density scan.  If you are 35 years of age or older, or if you are at risk for osteoporosis and fractures, ask your health care provider if you should be screened.  Ask your health care provider whether you should take a calcium or vitamin D supplement to lower your risk for osteoporosis.  Menopause may have certain physical symptoms and risks.  Hormone replacement therapy may reduce some of these symptoms and risks. Talk to your health care provider about whether hormone replacement therapy is right for you. Follow these instructions at home:  Schedule  regular health, dental, and eye exams.  Stay current with your immunizations.  Do not use any tobacco products including cigarettes, chewing tobacco, or electronic cigarettes.  If you are pregnant, do not drink alcohol.  If you are breastfeeding, limit how much and how often you drink alcohol.  Limit alcohol intake to no more than 1 drink per day for nonpregnant women. One drink equals 12 ounces of beer, 5 ounces of wine, or 1 ounces of hard liquor.  Do not use street drugs.  Do not share needles.  Ask your health care provider for help if you need support or information about quitting drugs.  Tell your health care provider if you often feel depressed.  Tell your health care provider if you have ever been abused or do not feel safe at home. This information is not intended to replace advice given to you by your health care provider. Make sure you discuss any questions you have with your health care provider. Document Released: 01/22/2011 Document Revised: 12/15/2015 Document Reviewed: 04/12/2015 Elsevier Interactive Patient Education  [employer].

## 2017-06-21 NOTE — Progress Notes (Signed)
    Subjective:    Patient ID: Christina Ray, female    DOB: 05/14/1956, 61 y.o.   MRN: [id number]   CC: wants to be tested for DM  DM- concerned becauses he has been very thirsty and peeing more frequently. No increased hunger.   Anxiety- was brought up at last visit because her mom had recently passed away. We had discussed medications at that time and patient has been thinking about it. She is open to an as needed medication because overall she is doing well but has her moments of anxiety and worrying. Overall she feels improved, job has improved somewhat she talked to her boss and told him he was taking her problems out on her, he apologized and things have been better between them.  Urinary frequency- some burning with urination, noticed white discharge. She thinks she has a yeast infection. Has not used any medications. She has gotten yeast infections in the past.   Palpitations/one episode of chest pain.  Patient requesting cardiology referral to "get checked out". Also has hx of HTN at age 61 and has been on atenolol since that age. She reports she was in bed about a week ago and felt a sharp chest pain that resolved in seconds, but it scared her and she wants to go to heart doctor. She denies SOB, chest pain on exertion.  HM Needs referral for mammo and colonoscopy  Smoking- non-smoker  ROS- see HPI  Objective:  BP 124/82   Pulse 65   Temp 98 F (36.7 C) (Oral)   Resp 14   Ht 5\' 11"  (1.803 m)   Wt (!) 303 lb (137.4 kg)   BMI 42.26 kg/m  Vitals and nursing note reviewed  General: well nourished, in no acute distress Cardiac: RRR, clear S1 and S2, no murmurs, rubs, or gallops Respiratory: clear to auscultation bilaterally, no increased work of breathing Extremities: no edema or cyanosis. GU: refused vaginal exam Skin: warm and dry, no rashes noted Neuro: alert and oriented, no focal deficits  Assessment & Plan:    Other abnormal glucose  A1C collected today,  normal at 5.4  -discussed lifestyle modifications with patient -will monitor yearly  Palpitations  Patient has hx of palpitations. She also reports one episode of chest pain that occurred about a week ago while in bed, resolved in seconds.   -referral to cardiology -may benefit from event monitor  Anxiety state  Situational anxiety, infrequent, not debilitating. GAD score is 4, "not at all difficult"  -rx for hydroxyzine given to use prn -follow up in 6 months or sooner if needed -consider SSRI in future  Urinary frequency  Afebrile, UA negative for UTI. Likely yeast infection. Patient declined vaginal exam. She states she knows it is a yeast infection. Has not taken anything.  -advised OTC monistat if needed -follow up if symptoms worsen or fail to improve.  Healthcare maintenance  Will check HIV and hep C Referral for mammo and colonoscopy made rx for tdap given Due for pap, will schedule    Return in about 6 months (around 12/19/2017).   Dolores PattyAngela Riccio, DO Family Medicine Resident PGY-2

## 2017-06-22 ENCOUNTER — Encounter: Payer: Self-pay | Admitting: Family Medicine

## 2017-06-22 LAB — CBC
Hematocrit: 40.2 % (ref 34.0–46.6)
Hemoglobin: 13.3 g/dL (ref 11.1–15.9)
MCH: 30.2 pg (ref 26.6–33.0)
MCHC: 33.1 g/dL (ref 31.5–35.7)
MCV: 91 fL (ref 79–97)
Platelets: 309 10*3/uL (ref 150–379)
RBC: 4.4 x10E6/uL (ref 3.77–5.28)
RDW: 14.2 % (ref 12.3–15.4)
WBC: 4.9 10*3/uL (ref 3.4–10.8)

## 2017-06-22 LAB — CMP14+EGFR
ALT: 14 IU/L (ref 0–32)
AST: 15 IU/L (ref 0–40)
Albumin/Globulin Ratio: 1.4 (ref 1.2–2.2)
Albumin: 4.2 g/dL (ref 3.6–4.8)
Alkaline Phosphatase: 68 IU/L (ref 39–117)
BUN/Creatinine Ratio: 22 (ref 12–28)
BUN: 17 mg/dL (ref 8–27)
Bilirubin Total: 0.3 mg/dL (ref 0.0–1.2)
CO2: 24 mmol/L (ref 20–29)
Calcium: 9.6 mg/dL (ref 8.7–10.3)
Chloride: 102 mmol/L (ref 96–106)
Creatinine, Ser: 0.78 mg/dL (ref 0.57–1.00)
GFR calc Af Amer: 94 mL/min/{1.73_m2} (ref >59)
GFR calc non Af Amer: 82 mL/min/{1.73_m2} (ref >59)
Globulin, Total: 3.1 g/dL (ref 1.5–4.5)
Glucose: 81 mg/dL (ref 65–99)
Potassium: 4.3 mmol/L (ref 3.5–5.2)
Sodium: 140 mmol/L (ref 134–144)
Total Protein: 7.3 g/dL (ref 6.0–8.5)

## 2017-06-22 LAB — HIV ANTIBODY (ROUTINE TESTING W REFLEX): HIV Screen 4th Generation wRfx: NONREACTIVE

## 2017-06-22 LAB — HEPATITIS C ANTIBODY: Hep C Virus Ab: <0.1 s/co ratio (ref 0.0–0.9)

## 2017-06-24 DIAGNOSIS — R350 Frequency of micturition: Secondary | ICD-10-CM | POA: Insufficient documentation

## 2017-06-24 DIAGNOSIS — Z0000 Encounter for general adult medical examination without abnormal findings: Secondary | ICD-10-CM | POA: Insufficient documentation

## 2017-06-24 DIAGNOSIS — R002 Palpitations: Secondary | ICD-10-CM | POA: Insufficient documentation

## 2017-06-24 DIAGNOSIS — R7309 Other abnormal glucose: Secondary | ICD-10-CM | POA: Insufficient documentation

## 2017-06-24 NOTE — Assessment & Plan Note (Addendum)
  Patient has hx of palpitations. She also reports one episode of chest pain that occurred about a week ago while in bed, resolved in seconds.   -referral to cardiology -may benefit from event monitor

## 2017-06-24 NOTE — Assessment & Plan Note (Signed)
  Situational anxiety, infrequent, not debilitating. GAD score is 4, "not at all difficult"  -rx for hydroxyzine given to use prn -follow up in 6 months or sooner if needed -consider SSRI in future

## 2017-06-24 NOTE — Assessment & Plan Note (Signed)
  A1C collected today, normal at 5.4  -discussed lifestyle modifications with patient -will monitor yearly

## 2017-06-24 NOTE — Assessment & Plan Note (Signed)
  Will check HIV and hep C Referral for mammo and colonoscopy made rx for tdap given Due for pap, will schedule

## 2017-06-24 NOTE — Assessment & Plan Note (Signed)
  Afebrile, UA negative for UTI. Likely yeast infection. Patient declined vaginal exam. She states she knows it is a yeast infection. Has not taken anything.  -advised OTC monistat if needed -follow up if symptoms worsen or fail to improve.

## 2017-10-02 DIAGNOSIS — M1611 Unilateral primary osteoarthritis, right hip: Secondary | ICD-10-CM | POA: Diagnosis not present

## 2017-10-02 DIAGNOSIS — G8929 Other chronic pain: Secondary | ICD-10-CM | POA: Insufficient documentation

## 2017-10-02 DIAGNOSIS — M1712 Unilateral primary osteoarthritis, left knee: Secondary | ICD-10-CM | POA: Diagnosis not present

## 2017-10-02 DIAGNOSIS — M1711 Unilateral primary osteoarthritis, right knee: Secondary | ICD-10-CM | POA: Diagnosis not present

## 2017-10-02 DIAGNOSIS — M25561 Pain in right knee: Secondary | ICD-10-CM | POA: Diagnosis not present

## 2017-10-24 ENCOUNTER — Other Ambulatory Visit: Payer: Self-pay | Admitting: Family Medicine

## 2017-10-24 ENCOUNTER — Telehealth: Payer: Self-pay

## 2017-10-24 MED ORDER — FLUTICASONE PROPIONATE 50 MCG/ACT NA SUSP: 2.0000 | Freq: Every day | NASAL | 6 refills | Status: AC

## 2017-10-24 NOTE — Telephone Encounter (Signed)
Pt [facility]. Too expensive to purchase OTC. Her call back [phone] Shawna OrleansMeredith B Thomsen, RN

## 2017-10-24 NOTE — Telephone Encounter (Signed)
Rx sent in.  Dolores PattyAngela Tyrie Porzio, DO PGY-2, [facility] 10/24/2017 8:44 AM

## 2017-11-07 DIAGNOSIS — J309 Allergic rhinitis, unspecified: Secondary | ICD-10-CM | POA: Diagnosis not present

## 2017-11-07 DIAGNOSIS — I10 Essential (primary) hypertension: Secondary | ICD-10-CM | POA: Diagnosis not present

## 2017-11-07 DIAGNOSIS — I499 Cardiac arrhythmia, unspecified: Secondary | ICD-10-CM | POA: Diagnosis not present

## 2017-11-07 DIAGNOSIS — K08109 Complete loss of teeth, unspecified cause, unspecified class: Secondary | ICD-10-CM | POA: Diagnosis not present

## 2017-11-07 DIAGNOSIS — Z6841 Body Mass Index (BMI) 40.0 and over, adult: Secondary | ICD-10-CM | POA: Diagnosis not present

## 2017-11-07 DIAGNOSIS — J029 Acute pharyngitis, unspecified: Secondary | ICD-10-CM | POA: Diagnosis not present

## 2017-11-07 DIAGNOSIS — E739 Lactose intolerance, unspecified: Secondary | ICD-10-CM | POA: Diagnosis not present

## 2017-11-07 DIAGNOSIS — R69 Illness, unspecified: Secondary | ICD-10-CM | POA: Diagnosis not present

## 2017-11-07 DIAGNOSIS — G8929 Other chronic pain: Secondary | ICD-10-CM | POA: Diagnosis not present

## 2017-11-12 ENCOUNTER — Ambulatory Visit (INDEPENDENT_AMBULATORY_CARE_PROVIDER_SITE_OTHER): Payer: Medicare HMO | Admitting: Cardiology

## 2017-11-12 ENCOUNTER — Encounter: Payer: Self-pay | Admitting: Cardiology

## 2017-11-12 ENCOUNTER — Ambulatory Visit: Payer: Medicare Other | Admitting: Family Medicine

## 2017-11-12 VITALS — BP 134/84 | HR 51 | Ht 71.0 in | Wt 295.6 lb

## 2017-11-12 DIAGNOSIS — R0602 Shortness of breath: Secondary | ICD-10-CM

## 2017-11-12 DIAGNOSIS — R079 Chest pain, unspecified: Secondary | ICD-10-CM | POA: Diagnosis not present

## 2017-11-12 DIAGNOSIS — M79604 Pain in right leg: Secondary | ICD-10-CM | POA: Insufficient documentation

## 2017-11-12 DIAGNOSIS — R002 Palpitations: Secondary | ICD-10-CM

## 2017-11-12 DIAGNOSIS — Z01818 Encounter for other preprocedural examination: Secondary | ICD-10-CM | POA: Diagnosis not present

## 2017-11-12 DIAGNOSIS — I10 Essential (primary) hypertension: Secondary | ICD-10-CM | POA: Diagnosis not present

## 2017-11-12 DIAGNOSIS — M79605 Pain in left leg: Secondary | ICD-10-CM | POA: Diagnosis not present

## 2017-11-12 MED ORDER — METOPROLOL TARTRATE 50 MG PO TABS: 50.0000 mg | ORAL_TABLET | Freq: Once | ORAL | 0 refills | Status: DC

## 2017-11-12 NOTE — Progress Notes (Signed)
PCP: Christina Sers, DO  Clinic Note: Chief Complaint  Patient presents with  . New Patient (Initial Visit)  . Chest Pain    states some pain in her chest area   . Shortness of Breath    some     HPI: Christina Ray is a 62 y.o. female with a PMH below who presents today for cardiology evaluation for several symptoms including chest pain and shortness of breath as well as palpitations.  There is also question of possible history of heart murmur. She is also she may need preoperative evaluation. Christina Ray being seen today at the request of Christina Sers, DO.  Christina Ray has family history for CAD or cardiac symptoms.  She thinks her mother had some CHF at the time of her death at age 1.  Her father died much earlier and he did did have probably cardiomyopathy but no comment of an MI.  Her maternal grandmother also had some type of heart disease but died at age 10.  Recent Hospitalizations: None since his ER visit November.  Studies Personally Reviewed - (if available, images/films reviewed: From Epic Chart or Care Everywhere)  None  Interval History: [name] presents here today for cardiology evaluation with multiple symptoms.  I had a hard time actually understanding what she was sent for.  One thing she notes is having episodes lasting to 3 minutes off and on for the last year that happens maybe once twice a month of irregular palpitations.  She also notes that she has this sharp pain in the upper chest off and on but is more in the breast and on the chest.  She also notes having dyspnea that can occur at rest and with exertion, but not associated with chest tightness or pressure. She describes having some leg discomfort and cramping with and without walking.  It does not sound classic for for claudication, but does raise suspicions.  No PND orthopnea with trace lower extremity edema.  Besides the palpitation episodes noted above, no real rapid irregular  heartbeats.  No syncope/near syncope or TIA/amaurosis fugax.  No melena, hematochezia, hematuria, or epstaxis. No claudication.  ROS: A comprehensive was performed. Review of Systems  Constitutional: Positive for malaise/fatigue (Has times where she feels tired and fatigued, but not always.).  HENT: Negative for ear discharge and nosebleeds.   Respiratory: Negative for cough, sputum production and wheezing.   Cardiovascular:       Per HPI  Gastrointestinal: Negative for abdominal pain, blood in stool, heartburn and melena.  Genitourinary: Negative for flank pain, hematuria and urgency.  Musculoskeletal: Positive for myalgias (Leg cramping with and without exertion).  Neurological: Positive for dizziness. Negative for tremors.  Psychiatric/Behavioral: The patient is nervous/anxious.   All other systems reviewed and are negative.   I have reviewed and (if needed) personally updated the patient's problem list, medications, allergies, past medical and surgical history, social and family history.   Past Medical History:  Diagnosis Date  . Asthma   . Chronic back pain   . Diverticulitis   . GERD (gastroesophageal reflux disease)   . Hyperlipidemia   . Hypertension     Past Surgical History:  Procedure Laterality Date  . ABDOMINAL HYSTERECTOMY    . Arthroscopic knee surgery Right    X3  . HAND SURGERY Right   . HIP SURGERY Right     Current Meds  Medication Sig  . aspirin EC 81 MG tablet Take 81 mg by  mouth daily.  Marland Kitchen. atenolol (TENORMIN) 50 MG tablet Take 1 tablet (50 mg total) by mouth daily.  . Cholecalciferol (VITAMIN D PO) Take 1 tablet by mouth daily.  Marland Kitchen. CINNAMON PO Take 1 tablet by mouth daily.  . Cyanocobalamin (VITAMIN B 12 PO) Take 1 tablet by mouth daily.  . fluticasone (FLONASE) 50 MCG/ACT nasal spray Place 2 sprays into both nostrils daily.  . hydrOXYzine (ATARAX/VISTARIL) 25 MG tablet Take 1 tablet (25 mg total) by mouth 3 (three) times daily as needed.  Marland Kitchen.  lisinopril-hydrochlorothiazide (PRINZIDE,ZESTORETIC) 20-25 MG tablet Take 1 tablet by mouth daily.  Marland Kitchen. loratadine (CLARITIN) 10 MG tablet Take 10 mg by mouth daily.  . Multiple Vitamin (MULTIVITAMIN) tablet Take 1 tablet by mouth daily.  . ondansetron (ZOFRAN-ODT) 4 MG disintegrating tablet Take 1 tablet (4 mg total) every 8 (eight) hours as needed by mouth for nausea or vomiting.  . RaNITidine HCl (ACID REDUCER PO) Take 1 tablet by mouth 2 (two) times daily.  . sodium chloride (OCEAN) 0.65 % SOLN nasal spray Place 1 spray into both nostrils 2 (two) times daily as needed for congestion.    No Known Allergies  Social History   Tobacco Use  . Smoking status: Former Smoker    Types: Cigarettes    Last attempt to quit: 1989    Years since quitting: 30.3  . Smokeless tobacco: Never Used  Substance Use Topics  . Alcohol use: No  . 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  Is disabled.  She tries exercise.      3 minutes a day for 12 to 20 minutes doing stationary bike.    family history includes Cancer in her other; Cardiomyopathy in her father; Congestive Heart Failure (age of onset: 7785) in her mother; Hypertension in her other.  Wt Readings from Last 3 Encounters:  11/13/17 294 lb 6.4 oz (133.5 kg)  11/12/17 295 lb 9.6 oz (134.1 kg)  06/21/17 (!) 303 lb (137.4 kg)    PHYSICAL EXAM BP 134/84 (BP Location: Left Arm)   Pulse (!) 51   Ht 5\' 11"  (1.803 m)   Wt 295 lb 9.6 oz (134.1 kg)   BMI 41.23 kg/m   Physical Exam  Constitutional: She is oriented to person, place, and time. She appears well-developed and well-nourished. No distress.  Morbidly obese.  Well-groomed  HENT:  Head: Normocephalic and atraumatic.  Mouth/Throat: No oropharyngeal exudate.  Eyes: Pupils are equal, round, and reactive to light. Conjunctivae and EOM are normal. No scleral icterus.    Neck: Normal range of motion. Neck supple. No hepatojugular reflux and no JVD present. Carotid bruit is not present.  Cardiovascular: Regular rhythm and normal pulses.  No extrasystoles are present. Bradycardia present. PMI is not displaced (Unable to palpate). Exam reveals distant heart sounds. Exam reveals no gallop and no friction rub.  Murmur heard. Pulmonary/Chest: She exhibits tenderness (Very difficult examination due to largePendulous breasts.  Unable to palpate PMI, unable to really tell where I am pressing to reproduce discomfort.).  Abdominal: Soft. Bowel sounds are normal. She exhibits no distension. There is no tenderness. There is no rebound.  Musculoskeletal: Normal range of motion. She exhibits edema (Trivial).  Neurological: She is alert and oriented to person, place, and time. No cranial nerve deficit.  Skin: Skin is warm and dry.  Mild  bilateral varicose veins with trace discoloration.  No major venous stasis changes  Psychiatric: She has a normal mood and affect. Her behavior is normal. Thought content normal.     Adult ECG Report  Rate: 57;  Rhythm: sinus bradycardia and Left axis deviation of Brein -30 degrees).  Otherwise normal intervals and durations.;   Narrative Interpretation: Otherwise essentially normal EKG  Other studies Reviewed: Additional studies/ records that were reviewed today include:  Recent Labs:  Per PCP .lastlipid None available.  ASSESSMENT / PLAN: Problem List Items Addressed This Visit    Shortness of breath    Short of breath both with rest and exertion.  This is probably deconditioning and obesity related with large pendulous breast, however since there is a component of dyspnea and a family history of cardiomyopathy we will check an echocardiogram to exclude cardiomyopathy, we will also check coronary CT angiogram to exclude ischemia.      Relevant Orders   EKG 12-Lead (Completed)   ECHOCARDIOGRAM COMPLETE   CT CORONARY MORPH W/CTA COR  W/SCORE W/CA W/CM &/OR WO/CM   CT CORONARY FRACTIONAL FLOW RESERVE DATA PREP   CT CORONARY FRACTIONAL FLOW RESERVE FLUID ANALYSIS   Basic metabolic panel   Palpitations (Chronic)     it would be nice to have a monitor to tell us what her palpitations are, but they are not really happening with enough frequency to guarantee having an episode captured on a 30-day monitor.  These are not as much of a concern to her and so therefore we will hold off on evaluation to see what structural and ischemic evaluation looks like. She is already on atenolol which would be the way of treating it.      Relevant Orders   EKG 12-Lead (Completed)   ECHOCARDIOGRAM COMPLETE   CT CORONARY MORPH W/CTA COR W/SCORE W/CA W/CM &/OR WO/CM   CT CORONARY FRACTIONAL FLOW RESERVE DATA PREP   CT CORONARY FRACTIONAL FLOW RESERVE FLUID ANALYSIS   Basic metabolic panel   Essential hypertension (Chronic)    Blood pressures upper limit of normal with lisinopril and HCTZ and atenolol dose.  Monitor for now.  Doing better.      Relevant Orders   Basic metabolic panel   Chest pain with moderate risk for cardiac etiology - Primary    She is having some symptoms that seem typical for potential angina but than others it seemed totally atypical.  I do not think a stress test would be effective on her given the size of her breasts, they will clearly be an anterior defect which would confuse issues. I would like to know her EF and therefore we will check a 2D echo, but to exclude coronary disease we will check coronary calcium score with coronary CTA.      Relevant Orders   EKG 12-Lead (Completed)   ECHOCARDIOGRAM COMPLETE   CT CORONARY MORPH W/CTA COR W/SCORE W/CA W/CM &/OR WO/CM   CT CORONARY FRACTIONAL FLOW RESERVE DATA PREP   CT CORONARY FRACTIONAL FLOW RESERVE FLUID ANALYSIS   Basic metabolic panel   Bilateral leg pain    Leg pain does not sound consistent with claudication however just there is probably better to evaluate  her possible cardiac issues first and then potentially evaluating claudication.       Other Visit Diagnoses    Pre-op testing          I spent a total of 45 minutes with the patient and chart review. >  50%  of the time was spent in direct patient consultation.   Current medicines are reviewed at length with the patient today.  (+/- concerns) n/a The following changes have been made:  n/as  Patient Instructions  NO MEDICATION CHANGES    SCHEDULE AT [address] Your physician has requested that you have an echocardiogram. Echocardiography is a painless test that uses sound waves to create images of your heart. It provides your doctor with information about the size and shape of your heart and how well your heart's chambers and valves are working. This procedure takes approximately one hour. There are no restrictions for this procedure   SCHEDULE AT [facility] - CORONARY CTA Your physician has requested that you have cardiac CT. Cardiac computed tomography (CT) is a painless test that uses an x-ray machine to take clear, detailed pictures of your heart. For further information please visit [email or web address] Please follow instruction sheet as given.   Studies Ordered:   Orders Placed This Encounter  Procedures  . CT CORONARY MORPH W/CTA COR W/SCORE W/CA W/CM &/OR WO/CM  . CT CORONARY FRACTIONAL FLOW RESERVE DATA PREP  . CT CORONARY FRACTIONAL FLOW RESERVE FLUID ANALYSIS  . Basic metabolic panel  . EKG 12-Lead  . ECHOCARDIOGRAM COMPLETE      Bryan Lemma, M.D., M.S. Interventional Cardiologist   Pager # [phone] Phone # [phone] [address] [place], [place] [address]   Thank you for choosing [facility] at [facility]!!

## 2017-11-12 NOTE — Patient Instructions (Addendum)
NO MEDICATION CHANGES    SCHEDULE AT [address] Your physician has requested that you have an echocardiogram. Echocardiography is a painless test that uses sound waves to create images of your heart. It provides your doctor with information about the size and shape of your heart and how well your heart's chambers and valves are working. This procedure takes approximately one hour. There are no restrictions for this procedure   SCHEDULE AT [facility] - CORONARY CTA Your physician has requested that you have cardiac CT. Cardiac computed tomography (CT) is a painless test that uses an x-ray machine to take clear, detailed pictures of your heart. For further information please visit [email or web address]. Please follow instruction sheet as given.   INSTRUCTIONS FOR  CORONARY CTA    [facility] at (30-45 minutes prior to test start time)  [facility] [address] [place], [place] [address] [phone]  Proceed to the [facility] Radiology Department (First Floor).  Please follow these instructions carefully (unless otherwise directed):  PLEASE HAVE LABS - BMP  AT LEAST ONE WEEK PRIOR TO TEST  On the Night Before the Test: . Drink plenty of water. . Do not consume any caffeinated/decaffeinated beverages or chocolate 12 hours prior to your test. . Do not take any antihistamines 12 hours prior to your test.    On the Day of the Test: . Drink plenty of water. Do not drink any water within one hour of the test. . Do not eat any food 4 hours prior to the test. . You may take your regular medications prior to the test. .  Take 50 mg of lopressor (metoprolol) one hour before the test.   After the Test: . Drink plenty of water. . After receiving IV contrast, you may experience a mild flushed feeling. This is normal. . On occasion, you may experience a mild rash up to 24 hours after the  test. This is not dangerous. If this occurs, you can take Benadryl 25 mg and increase your fluid intake. . If you experience trouble breathing, this can be serious. If it is severe call 911 IMMEDIATELY. If it is mild, please call our office.

## 2017-11-13 ENCOUNTER — Encounter: Payer: Self-pay | Admitting: Family Medicine

## 2017-11-13 ENCOUNTER — Ambulatory Visit (INDEPENDENT_AMBULATORY_CARE_PROVIDER_SITE_OTHER): Payer: Medicare HMO | Admitting: Family Medicine

## 2017-11-13 DIAGNOSIS — M79675 Pain in left toe(s): Secondary | ICD-10-CM

## 2017-11-13 DIAGNOSIS — R0981 Nasal congestion: Secondary | ICD-10-CM

## 2017-11-13 DIAGNOSIS — M79676 Pain in unspecified toe(s): Secondary | ICD-10-CM | POA: Insufficient documentation

## 2017-11-13 MED ORDER — OXYMETAZOLINE HCL 0.05 % NA SOLN: 1.0000 | Freq: Two times a day (BID) | NASAL | 0 refills | Status: DC

## 2017-11-13 NOTE — Patient Instructions (Signed)
Good to see you today!  Thanks for coming in.  You have a lot congestion you need a decongestant  Arin or Neosynephrine nasal spray  1-2 sprays after gently blowing your nose twice a day for 3 days only  Do opening exercise - hold your nose and swallow at least three times a day  If you are not feeling a lot better in 3-5 days then call and I will send in an antibiotics   For the toe - warm soaks and tylenol.  If red streaks or pus discharge then come back

## 2017-11-13 NOTE — Assessment & Plan Note (Signed)
Eustachian tube dysfunction - trial of topical decongestants

## 2017-11-13 NOTE — Assessment & Plan Note (Signed)
Seems related to unknown trauma vs DJD.  Analgesics and observe

## 2017-11-13 NOTE — Progress Notes (Signed)
Subjective  Christina Ray is a 62 y.o. female is presenting with the following  URI  Major symptoms: Nasal stuffiness, post nasal drip, stuffy ears,  Has been sick for 3 weeks. Progression: initially antihistamines helped but not currently Medications tried: Flonase, nasal saline, Claritin Sick contacts: no Patient believes may be caused by possible sinusitis  Symptoms Fever: no Headache or face pain: mild Tooth pain: no Sneezing: better with Claritin Scratchy throat: yes Allergies: yes Muscle aches: no Severe fatigue: no Stiff neck: no Shortness of breath: no Rash: no Sore throat or swollen glands: no   TOE PAIN Left third toe pain and swelling on and off.  No remembered trauma.  No redness or fever Has not tried any medications   ROS see HPI Smoking Status noted   Chief Complaint noted Review of Symptoms - see HPI PMH - Smoking status noted.    Objective Vital Signs reviewed BP 130/60 (BP Location: Right Arm, Patient Position: Sitting, Cuff Size: Large)   Pulse (!) 59   Temp 98.2 F (36.8 C) (Oral)   Ht 5\' 11"  (1.803 m)   Wt 294 lb 6.4 oz (133.5 kg)   SpO2 99%   BMI 41.06 kg/m  Alert NAD Lungs:  Normal respiratory effort, chest expands symmetrically. Lungs are clear to auscultation, no crackles or wheezes. Heart - Regular rate and rhythm.  No murmurs, gallops or rubs.    Skin:  Intact without suspicious lesions or rashes Nose - boggy pale turbinates TM's - cloudily bilaterally Mild bilateral sinus tenderness Left third toe - DIP is mildly swollen and toe nail slightly separated   Assessments/Plans  See after visit summary for details of patient instructions  Nasal congestion Eustachian tube dysfunction - trial of topical decongestants   Toe pain Seems related to unknown trauma vs DJD.  Analgesics and observe

## 2017-11-14 ENCOUNTER — Encounter: Payer: Self-pay | Admitting: Cardiology

## 2017-11-14 NOTE — Assessment & Plan Note (Signed)
Short of breath both with rest and exertion.  This is probably deconditioning and obesity related with large pendulous breast, however since there is a component of dyspnea and a family history of cardiomyopathy we will check an echocardiogram to exclude cardiomyopathy, we will also check coronary CT angiogram to exclude ischemia.

## 2017-11-14 NOTE — Assessment & Plan Note (Signed)
it would be nice to have a monitor to tell us what her palpitations are, but they are not really happening with enough frequency to guarantee having an episode captured on a 30-day monitor.  These are not as much of a concern to her and so therefore we will hold off on evaluation to see what structural and ischemic evaluation looks like. She is already on atenolol which would be the way of treating it.

## 2017-11-14 NOTE — Assessment & Plan Note (Signed)
Blood pressures upper limit of normal with lisinopril and HCTZ and atenolol dose.  Monitor for now.  Doing better.

## 2017-11-14 NOTE — Assessment & Plan Note (Signed)
Leg pain does not sound consistent with claudication however just there is probably better to evaluate her possible cardiac issues first and then potentially evaluating claudication.

## 2017-11-14 NOTE — Assessment & Plan Note (Signed)
She is having some symptoms that seem typical for potential angina but than others it seemed totally atypical.  I do not think a stress test would be effective on her given the size of her breasts, they will clearly be an anterior defect which would confuse issues. I would like to know her EF and therefore we will check a 2D echo, but to exclude coronary disease we will check coronary calcium score with coronary CTA.

## 2017-11-21 ENCOUNTER — Other Ambulatory Visit: Payer: Self-pay

## 2017-11-21 ENCOUNTER — Ambulatory Visit (HOSPITAL_COMMUNITY): Payer: Medicare HMO | Attending: Cardiology

## 2017-11-21 DIAGNOSIS — R002 Palpitations: Secondary | ICD-10-CM | POA: Diagnosis not present

## 2017-11-21 DIAGNOSIS — R079 Chest pain, unspecified: Secondary | ICD-10-CM | POA: Diagnosis not present

## 2017-11-21 DIAGNOSIS — I253 Aneurysm of heart: Secondary | ICD-10-CM | POA: Insufficient documentation

## 2017-11-21 DIAGNOSIS — I10 Essential (primary) hypertension: Secondary | ICD-10-CM | POA: Diagnosis not present

## 2017-11-21 DIAGNOSIS — J45909 Unspecified asthma, uncomplicated: Secondary | ICD-10-CM | POA: Insufficient documentation

## 2017-11-21 DIAGNOSIS — R0602 Shortness of breath: Secondary | ICD-10-CM | POA: Diagnosis not present

## 2017-11-21 DIAGNOSIS — I272 Pulmonary hypertension, unspecified: Secondary | ICD-10-CM | POA: Insufficient documentation

## 2017-11-28 ENCOUNTER — Telehealth: Payer: Self-pay

## 2017-11-28 NOTE — Telephone Encounter (Signed)
Pt called nurse line, states she is not any better from last ov- was told if she wasn't feeling better to call and would send in antibiotics to treat sinus infection. Pt call back [phone] Shawna Orleans, RN

## 2017-11-29 ENCOUNTER — Telehealth: Payer: Self-pay | Admitting: Cardiology

## 2017-11-29 MED ORDER — AMOXICILLIN 250 MG PO CAPS: 250.0000 mg | ORAL_CAPSULE | Freq: Three times a day (TID) | ORAL | 0 refills | Status: DC

## 2017-11-29 NOTE — Telephone Encounter (Signed)
Notes recorded by Marykay Lex, MD on 11/24/2017 at 9:59 PM EDT Transthoracic echo results: Normal left ventricle pump function with ejection fraction of 60 to 65%. Not unexpectedly grade 1 diastolic dysfunction/abnormal relaxation. Mild elevated pulmonary pressures and mildly dilated ascending aorta --  Not enough to be concerning.  Normal aortic and mitral valve.  nothing to explain palpitations, or really for that matter shortness of breath.  Bryan Lemma, MD

## 2017-11-29 NOTE — Telephone Encounter (Signed)
Spoke with her Still having ear fullness and cough Will call in Amox   Having skin boils - no fever Recommend see PCP for this and for Pap smear   She agrees

## 2017-11-29 NOTE — Telephone Encounter (Signed)
LMTCB

## 2017-11-29 NOTE — Telephone Encounter (Signed)
New Message    Patient calling for results of echo

## 2017-12-03 NOTE — Telephone Encounter (Signed)
LMTCB

## 2017-12-03 NOTE — Telephone Encounter (Signed)
Follow up    Patient calling for echo results 

## 2017-12-04 NOTE — Telephone Encounter (Signed)
LMTCB

## 2017-12-05 NOTE — Telephone Encounter (Signed)
Follow up    Patient is returning call about echocardiogram results.

## 2017-12-05 NOTE — Telephone Encounter (Signed)
Left message to call back  

## 2017-12-09 ENCOUNTER — Other Ambulatory Visit: Payer: Self-pay | Admitting: Family Medicine

## 2017-12-24 ENCOUNTER — Telehealth: Payer: Self-pay | Admitting: *Deleted

## 2017-12-24 DIAGNOSIS — R079 Chest pain, unspecified: Secondary | ICD-10-CM

## 2017-12-24 DIAGNOSIS — Z01818 Encounter for other preprocedural examination: Secondary | ICD-10-CM

## 2017-12-24 NOTE — Telephone Encounter (Signed)
Informed by scheduler - patient has called and left message with scheduler. scheduler states she attempted to speak to patient x 2  call goes to voicemail. Patient states she has not had labs completed for CCTA SCHEDULE for 12/25/17.  RN called and had to leave message - goes to voicemail.-- Patient will get labs at  Radiology ( order placed, but I STAT? will need to done at point care) and if PATIENT HAS not picked UP MEDICATION FROM PHARMACY - METOPROLOL- please do so, contact office in the morning to let us know message was received.

## 2017-12-25 ENCOUNTER — Ambulatory Visit (HOSPITAL_COMMUNITY)
Admission: RE | Admit: 2017-12-25 | Discharge: 2017-12-25 | Disposition: A | Discharge status: Home / Self Care | Payer: Medicare HMO | Source: Ambulatory Visit | Attending: Cardiology | Admitting: Cardiology

## 2017-12-25 ENCOUNTER — Telehealth: Payer: Self-pay | Admitting: Cardiology

## 2017-12-25 ENCOUNTER — Ambulatory Visit (HOSPITAL_COMMUNITY): Payer: Medicare HMO

## 2017-12-25 NOTE — Telephone Encounter (Signed)
Left message to call back  

## 2017-12-25 NOTE — Telephone Encounter (Signed)
Spoke with patient regarding Cardiac CT today. Stated she was unaware of taking the Metoprolol and needing labs so she would like to reschedule. Spoke with Al PimpleSharon F and she will be in touch with patient. Reprinted AVS from last ov with her CT instructions which she stated she could not find and mailed to patient.

## 2017-12-25 NOTE — Telephone Encounter (Signed)
New Message:      Pt is calling and has question about her test being performed today and will she need labs done before this procedure.

## 2018-01-13 ENCOUNTER — Encounter: Payer: Self-pay | Admitting: Cardiology

## 2018-01-16 ENCOUNTER — Other Ambulatory Visit (HOSPITAL_COMMUNITY): Payer: Medicare HMO

## 2018-01-16 ENCOUNTER — Ambulatory Visit (HOSPITAL_COMMUNITY): Payer: Medicare HMO

## 2018-01-27 ENCOUNTER — Ambulatory Visit (HOSPITAL_COMMUNITY): Admission: RE | Admit: 2018-01-27 | Payer: Medicare HMO | Source: Ambulatory Visit

## 2018-01-29 ENCOUNTER — Encounter: Payer: Self-pay | Admitting: *Deleted

## 2018-02-04 ENCOUNTER — Ambulatory Visit: Payer: Medicare HMO | Admitting: Family Medicine

## 2018-02-26 ENCOUNTER — Ambulatory Visit (HOSPITAL_COMMUNITY)
Admission: EM | Admit: 2018-02-26 | Discharge: 2018-02-26 | Disposition: A | Discharge status: Home / Self Care | Payer: Medicare HMO | Attending: Family Medicine | Admitting: Family Medicine

## 2018-02-26 ENCOUNTER — Ambulatory Visit (INDEPENDENT_AMBULATORY_CARE_PROVIDER_SITE_OTHER): Payer: Medicare HMO

## 2018-02-26 ENCOUNTER — Encounter

## 2018-02-26 ENCOUNTER — Encounter (HOSPITAL_COMMUNITY): Payer: Self-pay | Admitting: Emergency Medicine

## 2018-02-26 DIAGNOSIS — M79671 Pain in right foot: Secondary | ICD-10-CM | POA: Diagnosis not present

## 2018-02-26 DIAGNOSIS — S99921A Unspecified injury of right foot, initial encounter: Secondary | ICD-10-CM | POA: Diagnosis not present

## 2018-02-26 DIAGNOSIS — S9031XA Contusion of right foot, initial encounter: Secondary | ICD-10-CM | POA: Diagnosis not present

## 2018-02-26 MED ORDER — NAPROXEN 500 MG PO TABS: 500.0000 mg | ORAL_TABLET | Freq: Two times a day (BID) | ORAL | 0 refills | Status: DC | PRN

## 2018-02-26 NOTE — ED Provider Notes (Signed)
[facility]   [id number] 02/26/18 Arrival Time: 1525  CC: Right foot pain  SUBJECTIVE: History from: patient. Christina Ray is a 62 y.o. female complains of improving right foot pain that began 3 days ago.  It began after the blunt end of a metal and wooden knife landed on her foot.  Localizes the pain to the top of foot.  Describes the pain as constant and throbbing in character.  Has tried OTC medications including advil, bengay, and epsom salt soaks with temporary relief.  Symptoms are made worse with weight bearing activities.  Denies similar symptoms in the past.  Complains of ecchymosis and swelling.  Improved now.  Denies fever, chills, erythema, weakness, numbness and tingling.      ROS: As per HPI.  Past Medical History:  Diagnosis Date  . Asthma   . Chronic back pain   . Diverticulitis   . GERD (gastroesophageal reflux disease)   . Hyperlipidemia   . Hypertension    Past Surgical History:  Procedure Laterality Date  . ABDOMINAL HYSTERECTOMY    . Arthroscopic knee surgery Right    X3  . HAND SURGERY Right   . HIP SURGERY Right    No Known Allergies No current facility-administered medications on file prior to encounter.    Current Outpatient Medications on File Prior to Encounter  Medication Sig Dispense Refill  . amoxicillin (AMOXIL) 250 MG capsule Take 1 capsule (250 mg total) by mouth 3 (three) times daily. 21 capsule 0  . aspirin EC 81 MG tablet Take 81 mg by mouth daily.    Marland Kitchen atenolol (TENORMIN) 50 MG tablet Take 1 tablet (50 mg total) by mouth daily. 90 tablet 2  . Cholecalciferol (VITAMIN D PO) Take 1 tablet by mouth daily.    Marland Kitchen CINNAMON PO Take 1 tablet by mouth daily.    . Cyanocobalamin (VITAMIN B 12 PO) Take 1 tablet by mouth daily.    . fluticasone (FLONASE) 50 MCG/ACT nasal spray Place 2 sprays into both nostrils daily. 16 g 6  . hydrOXYzine (ATARAX/VISTARIL) 25 MG tablet Take 1 tablet (25 mg total) by mouth 3 (three) times daily as needed.  30 tablet 2  . lisinopril-hydrochlorothiazide (PRINZIDE,ZESTORETIC) 20-25 MG tablet TAKE 1 TABLET BY MOUTH EVERY DAY 90 tablet 1  . loratadine (CLARITIN) 10 MG tablet Take 10 mg by mouth daily.    . metoprolol tartrate (LOPRESSOR) 50 MG tablet Take 1 tablet (50 mg total) by mouth once for 1 dose. TAKE ONE HOUR PRIOR TO  SCHEDULE CARDAIC TEST 1 tablet 0  . Multiple Vitamin (MULTIVITAMIN) tablet Take 1 tablet by mouth daily.    . ondansetron (ZOFRAN-ODT) 4 MG disintegrating tablet Take 1 tablet (4 mg total) every 8 (eight) hours as needed by mouth for nausea or vomiting. 15 tablet 0  . oxymetazoline (AFRIN NASAL SPRAY) 0.05 % nasal spray Place 1 spray into both nostrils 2 (two) times daily. 30 mL 0  . RaNITidine HCl (ACID REDUCER PO) Take 1 tablet by mouth 2 (two) times daily.    . sodium chloride (OCEAN) 0.65 % SOLN nasal spray Place 1 spray into both nostrils 2 (two) times daily as needed for congestion.     Social History   Socioeconomic History  . Marital status: Single    Spouse name: Not on file  . Number of children: Not on file  . Years of education: Not on file  . Highest education level: Not on file  Occupational History  .  Not on file  Social Needs  . Financial resource strain: Not on file  . Food insecurity:    Worry: Not on file    Inability: Not on file  . Transportation needs:    Medical: Not on file    Non-medical: Not on file  Tobacco Use  . Smoking status: Former Smoker    Types: Cigarettes    Last attempt to quit: 1989    Years since quitting: 30.6  . Smokeless tobacco: Never Used  Substance and Sexual Activity  . Alcohol use: No  . Drug use: No  . Sexual activity: Yes    Birth control/protection: Surgical  Lifestyle  . Physical activity:    Days per week: Not on file    Minutes per session: Not on file  . Stress: Not on file  Relationships  . Social connections:    Talks on phone: Not on file    Gets together: Not on file    Attends religious service:  Not on file    Active member of club or organization: Not on file    Attends meetings of clubs or organizations: Not on file    Relationship status: Not on file  . Intimate partner violence:    Fear of current or ex partner: Not on file    Emotionally abused: Not on file    Physically abused: Not on file    Forced sexual activity: Not on file  Other Topics Concern  . 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  Is disabled.  She tries exercise.      3 minutes a day for 12 to 20 minutes doing stationary bike.   Family History  Problem Relation Age of Onset  . Hypertension Other   . Cancer Other   . Congestive Heart Failure Mother [id number]  . Cardiomyopathy Father        He has an ICD, but no comment of MI.  (Is in his 7780s    OBJECTIVE:  Vitals:   02/26/18 1603  BP: (!) 135/58  Pulse: 64  Resp: 18  Temp: 98 F (36.7 C)  TempSrc: Oral  SpO2: 100%    General appearance: AOx3; in no acute distress.  Head: NCAT Lungs: CTA bilaterally Heart: RRR.  Clear S1 and S2 without murmur, gallops, or rubs.  Radial pulses 2+ bilaterally. Musculoskeletal: Right foot Inspection: Skin warm, dry, clear and intact without obvious erythema, effusion, or ecchymosis.  Palpation: Mildly tender about the lateral tarsal bones ROM: FROM active and passive Strength: 5/5 dorsiflexion, 5/5 plantar flexion Skin: warm and dry Neurologic: Ambulates without difficulty; Sensation intact about the upper/ lower extremities Psychological: alert and cooperative; normal mood and affect  DIAGNOSTIC STUDIES:   CLINICAL DATA: 62 year old with three-day history of RIGHT foot pain after an injury when a wooden and metallic object struck the RIGHT foot. Initial encounter.  EXAM: RIGHT FOOT COMPLETE - 3+ VIEW  COMPARISON: None.  FINDINGS: No evidence of acute fracture or dislocation. Mild narrowing of  the joint spaces in the midfoot and the first MTP joint. Well-preserved bone mineral density. Moderate-sized plantar calcaneal spur.  IMPRESSION: 1. No acute osseous abnormality. 2. Mild osteoarthritis.   Electronically Signed By: Hulan Saashomas Lawrence M.D. On: 02/26/2018 17:02  X-rays negative for bony abnormalities including fracture, or dislocation.  No soft tissue swelling.    I have reviewed the  x-rays myself and the radiologist interpretation. I am in agreement with the radiologist interpretation.    ASSESSMENT & PLAN:  1. Foot pain, right   2. Contusion of right foot, initial encounter     Meds ordered this encounter  Medications  . naproxen (NAPROSYN) 500 MG tablet    Sig: Take 1 tablet (500 mg total) by mouth 2 (two) times daily as needed for moderate pain.    Dispense:  30 tablet    Refill:  0    Order Specific Question:   Supervising Provider    Answer:   Isa Rankin [[id number]]   X-rays did not show a fracture or dislocation Walking boot given Continue conservative management of rest, ice, and elevation Take naproxen as needed for pain relief (may cause abdominal discomfort, ulcers, and GI bleeds avoid taking with other NSAIDs) Follow up with PCP if symptoms persist Return or go to the ER if you have any new or worsening symptoms (fever, chills, chest pain, abdominal pain, changes in bowel or bladder habits, pain radiating into lower legs, etc...)   Reviewed expectations re: course of current medical issues. Questions answered. Outlined signs and symptoms indicating need for more acute intervention. Patient verbalized understanding. After Visit Summary given.    Rennis Harding, PA-C 02/26/18 1712

## 2018-02-26 NOTE — ED Triage Notes (Signed)
Pt sts right foot pain since object hit right foot on Sunday

## 2018-02-26 NOTE — Discharge Instructions (Addendum)
X-rays did not show a fracture or dislocation Walking boot given Continue conservative management of rest, ice, and elevation Take naproxen as needed for pain relief (may cause abdominal discomfort, ulcers, and GI bleeds avoid taking with other NSAIDs) Follow up with PCP if symptoms persist Return or go to the ER if you have any new or worsening symptoms (fever, chills, chest pain, abdominal pain, changes in bowel or bladder habits, pain radiating into lower legs, etc...)

## 2018-03-05 ENCOUNTER — Encounter: Payer: Self-pay | Admitting: Family Medicine

## 2018-03-05 ENCOUNTER — Ambulatory Visit (INDEPENDENT_AMBULATORY_CARE_PROVIDER_SITE_OTHER): Payer: Medicare HMO | Admitting: Family Medicine

## 2018-03-05 ENCOUNTER — Other Ambulatory Visit: Payer: Self-pay

## 2018-03-05 VITALS — BP 130/80 | HR 57 | Temp 98.0°F | Ht 71.0 in | Wt 295.0 lb

## 2018-03-05 DIAGNOSIS — Z1211 Encounter for screening for malignant neoplasm of colon: Secondary | ICD-10-CM

## 2018-03-05 DIAGNOSIS — S91311D Laceration without foreign body, right foot, subsequent encounter: Secondary | ICD-10-CM

## 2018-03-05 DIAGNOSIS — K219 Gastro-esophageal reflux disease without esophagitis: Secondary | ICD-10-CM | POA: Diagnosis not present

## 2018-03-05 DIAGNOSIS — Z23 Encounter for immunization: Secondary | ICD-10-CM

## 2018-03-05 DIAGNOSIS — Z0000 Encounter for general adult medical examination without abnormal findings: Secondary | ICD-10-CM | POA: Diagnosis not present

## 2018-03-05 DIAGNOSIS — I10 Essential (primary) hypertension: Secondary | ICD-10-CM | POA: Diagnosis not present

## 2018-03-05 MED ORDER — LORATADINE 10 MG PO TABS: 10.0000 mg | ORAL_TABLET | Freq: Every day | ORAL | 3 refills | Status: DC

## 2018-03-05 NOTE — Patient Instructions (Addendum)
  Great to see you today!  Please call Dr. Elissa HeftyHarding's office to reschedule cardiac CT. I would really like you to get this done to get a good look at your heart vessels.  Call his office at: Phone # [phone] [address] [place], [place] [address]  Please make an appointment with me at your leisure to get pap smear done. I referred you to GI for colonoscopy, they'll call you.  If you have questions or concerns please do not hesitate to call at [phone].  Dolores PattyAngela Ashvik Grundman, DO PGY-3, [facility] 03/05/2018 3:05 PM

## 2018-03-05 NOTE — Progress Notes (Signed)
    Subjective:    Patient ID: Christina Ray, female    DOB: 11/08/1955, 62 y.o.   MRN: [id number]   CC: physical exam  HTN- on linsinopril-HCTZ. Denies chest pain, SOB, DOE, headaches, vision changes.  Gerd- worse w/ spicy foods, eating late. Takes ranitidine with relief. She reports she has had reflux for years. Has never seen GI or had endoscopy. She is due for colonoscopy. Denies early satiety, weight loss, difficulty swallowing.   Foot injury- had cut the top of her foot and went to UC on 8/7 on "piece of metal outside". Did not require stitches. She did not get tetanus shot done at this time. She reports its healing well.   HM- due for pap, tetanus, colonoscopy. Last colonoscopy at [facility].  Smoking status reviewed- quit in 1989  Review of Systems- see HPI   Objective:  BP 130/80   Pulse (!) 57   Temp 98 F (36.7 C) (Oral)   Ht 5\' 11"  (1.803 m)   Wt 295 lb (133.8 kg)   SpO2 98%   BMI 41.14 kg/m  Vitals and nursing note reviewed  General: pleasant obese female in NAD HEENT: normocephalic, MMM Neck: supple, non-tender, without lymphadenopathy Cardiac: RRR, clear S1 and S2, no murmurs, rubs, or gallops Respiratory: clear to auscultation bilaterally, no increased work of breathing Abdomen: soft, nontender, nondistended, no masses or organomegaly. Bowel sounds present Extremities: no edema or cyanosis. Healing shallow laceration over dorsum of right foot, no surrounding erythema or purulence present Skin: warm and dry, no rashes noted Neuro: alert and oriented, no focal deficits   Assessment & Plan:   1. Essential hypertension Well controlled, at goal, continue current regimen.  2. Gastroesophageal reflux disease, esophagitis presence not specified Continue ranitidine. Referred to GI as below. Asked patient to discuss long hx of reflux with them, may need upper scope as well. No red flag symptoms. Discussed diet changes.   3. Special screening for malignant  neoplasms, colon Due for colonoscopy, referred to GI - Ambulatory referral to Gastroenterology  4. Healthcare maintenance Due for pap, patient would like to make appointment to return for this  5. Laceration of right foot, subsequent encounter Healing, will give tetanus shot in office today - Tdap vaccine greater than or equal to 7yo IM   Return in about 4 weeks (around 04/02/2018) for pap.   Dolores PattyAngela Harlo Fabela, DO Family Medicine Resident PGY-3

## 2018-04-04 ENCOUNTER — Other Ambulatory Visit: Payer: Self-pay | Admitting: Family Medicine

## 2018-04-07 ENCOUNTER — Telehealth: Payer: Self-pay | Admitting: *Deleted

## 2018-04-07 NOTE — Telephone Encounter (Signed)
Pt states that last time she had cough / scratchy throat she was given an abx.  Advised that she would likely need an appt but she declines and states that this has been done for her before and she request that I send a message to Dr. Wonda Oldsiccio to verify with her. Fleeger, Maryjo RochesterJessica Dawn, CMA

## 2018-04-07 NOTE — Telephone Encounter (Signed)
Patient needs an appointment to be seen to see what is going on. Thanks!

## 2018-04-08 NOTE — Telephone Encounter (Signed)
LMOVM informing patient. Fleeger, Jessica Dawn, CMA  

## 2018-04-18 ENCOUNTER — Ambulatory Visit: Payer: Medicare HMO | Admitting: Family Medicine

## 2018-04-22 ENCOUNTER — Ambulatory Visit: Payer: Medicare HMO | Admitting: Family Medicine

## 2018-05-12 ENCOUNTER — Encounter: Payer: Self-pay | Admitting: Family Medicine

## 2018-05-12 ENCOUNTER — Ambulatory Visit (INDEPENDENT_AMBULATORY_CARE_PROVIDER_SITE_OTHER): Payer: Medicare HMO | Admitting: Family Medicine

## 2018-05-12 VITALS — BP 135/60 | HR 52 | Temp 98.5°F | Wt 298.8 lb

## 2018-05-12 DIAGNOSIS — Z23 Encounter for immunization: Secondary | ICD-10-CM

## 2018-05-12 DIAGNOSIS — R0981 Nasal congestion: Secondary | ICD-10-CM

## 2018-05-12 DIAGNOSIS — J302 Other seasonal allergic rhinitis: Secondary | ICD-10-CM | POA: Diagnosis not present

## 2018-05-12 MED ORDER — MONTELUKAST SODIUM 10 MG PO TABS: 10.0000 mg | ORAL_TABLET | Freq: Every day | ORAL | 0 refills | Status: DC

## 2018-05-12 MED ORDER — RANITIDINE HCL 150 MG PO TABS: 150.0000 mg | ORAL_TABLET | Freq: Two times a day (BID) | ORAL | 2 refills | Status: DC

## 2018-05-12 NOTE — Patient Instructions (Signed)
Good to see you today!  Please use the flonase 2 sprays as you have been. Please continue claritin Add ranitidine (zantac) twice a day and singulair at night.  Please let me know if you would like to see ENT.  If you have questions or concerns please do not hesitate to call at [phone].  Dolores Patty, DO PGY-3, [facility] 05/12/2018 4:25 PM  Allergic Rhinitis, Adult Allergic rhinitis is an allergic reaction that affects the mucous membrane inside the nose. It causes sneezing, a runny or stuffy nose, and the feeling of mucus going down the back of the throat (postnasal drip). Allergic rhinitis can be mild to severe. There are two types of allergic rhinitis:  Seasonal. This type is also called hay fever. It happens only during certain seasons.  Perennial. This type can happen at any time of the year.  What are the causes? This condition happens when the body's defense system (immune system) responds to certain harmless substances called allergens as though they were germs.  Seasonal allergic rhinitis is triggered by pollen, which can come from grasses, trees, and weeds. Perennial allergic rhinitis may be caused by:  House dust mites.  Pet dander.  Mold spores.  What are the signs or symptoms? Symptoms of this condition include:  Sneezing.  Runny or stuffy nose (nasal congestion).  Postnasal drip.  Itchy nose.  Tearing of the eyes.  Trouble sleeping.  Daytime sleepiness.  How is this diagnosed? This condition may be diagnosed based on:  Your medical history.  A physical exam.  Tests to check for related conditions, such as: ? Asthma. ? Pink eye. ? Ear infection. ? Upper respiratory infection.  Tests to find out which allergens trigger your symptoms. These may include skin or blood tests.  How is this treated? There is no cure for this condition, but treatment can help control symptoms. Treatment may include:  Taking medicines  that block allergy symptoms, such as antihistamines. Medicine may be given as a shot, nasal spray, or pill.  Avoiding the allergen.  Desensitization. This treatment involves getting ongoing shots until your body becomes less sensitive to the allergen. This treatment may be done if other treatments do not help.  If taking medicine and avoiding the allergen does not work, new, stronger medicines may be prescribed.  Follow these instructions at home:  Find out what you are allergic to. Common allergens include smoke, dust, and pollen.  Avoid the things you are allergic to. These are some things you can do to help avoid allergens: ? Replace carpet with wood, tile, or vinyl flooring. Carpet can trap dander and dust. ? Do not smoke. Do not allow smoking in your home. ? Change your heating and air conditioning filter at least once a month. ? During allergy season:  Keep windows closed as much as possible.  Plan outdoor activities when pollen counts are lowest. This is usually during the evening hours.  When coming indoors, change clothing and shower before sitting on furniture or bedding.  Take over-the-counter and prescription medicines only as told by your health care provider.  Keep all follow-up visits as told by your health care provider. This is important. Contact a health care provider if:  You have a fever.  You develop a persistent cough.  You make whistling sounds when you breathe (you wheeze).  Your symptoms interfere with your normal daily activities. Get help right away if:  You have shortness of breath. Summary  This condition can be  managed by taking medicines as directed and avoiding allergens.  Contact your health care provider if you develop a persistent cough or fever.  During allergy season, keep windows closed as much as possible. This information is not intended to replace advice given to you by your health care provider. Make sure you discuss any  questions you have with your health care provider. Document Released: 04/03/2001 Document Revised: 08/16/2016 Document Reviewed: 08/16/2016 Elsevier Interactive Patient Education  [employer]2018 Elsevier Inc.

## 2018-05-12 NOTE — Progress Notes (Signed)
    Subjective:    Patient ID: Christina Ray, female    DOB: [birth year], 62 y.o.   MRN: [id number]   CC: sinus infection  HPI: patient reports she tried to be seen 2 weeks ago when her symptoms started but she was turned away from appointment because she used bathroom and was then "late" to appointment time. She is frustrated by this. She reports nasal congestion and green mucous for 2 weeks. She denies fevers or chills. No facial pressure or pain. She feels like her ears are "stopped up" and she wants an antibiotic to resolve these issues. She has severe environmental allergies. She has seen allergist in the past but did not want to do the shots. She uses flonase every morning and takes claritin daily.    Smoking status reviewed- former smoker  Review of Systems- see HPI.   Objective:  BP 135/60   Pulse (!) 52   Temp 98.5 F (36.9 C) (Oral)   Wt 298 lb 12.8 oz (135.5 kg)   SpO2 98%   BMI 41.67 kg/m  Vitals and nursing note reviewed  General: well appearing, nourished, in no acute distress HEENT: normocephalic, TM's visualized bilaterally without erythema or fluid, nasal turbinates erythematous and edematous bilaterally, no nasale discharge present. No sinus pressure. MMM. good dentition without erythema or discharge noted in posterior oropharynx Neck: supple, non-tender, without lymphadenopathy Cardiac: RRR, clear S1 and S2, no murmurs, rubs, or gallops Respiratory: clear to auscultation bilaterally, no increased work of breathing Neuro: alert and oriented, no focal deficits   Assessment & Plan:    Seasonal allergies  Patient likely experiencing nasal congestion and sensation of full ears from allergies. Continue claritin and flonase, added singulair, follow up 1 month or sooner. Discussed returning to allergist vs ENT. Patient will consider this.     Return if symptoms worsen or fail to improve.   Dolores Patty, DO Family Medicine Resident PGY-3

## 2018-05-13 NOTE — Assessment & Plan Note (Addendum)
  Patient likely experiencing nasal congestion and sensation of full ears from allergies. No signs of acute bacterial sinusitis on exam. Continue claritin and flonase, added singulair, follow up 1 month or sooner. Discussed returning to allergist vs ENT. Patient will consider this.

## 2018-06-03 ENCOUNTER — Other Ambulatory Visit: Payer: Self-pay | Admitting: Family Medicine

## 2018-07-11 ENCOUNTER — Other Ambulatory Visit: Payer: Self-pay | Admitting: Family Medicine

## 2018-07-14 ENCOUNTER — Other Ambulatory Visit: Payer: Self-pay | Admitting: Family Medicine

## 2018-07-30 ENCOUNTER — Ambulatory Visit (INDEPENDENT_AMBULATORY_CARE_PROVIDER_SITE_OTHER): Payer: Medicare HMO | Admitting: Family Medicine

## 2018-07-30 ENCOUNTER — Other Ambulatory Visit: Payer: Self-pay

## 2018-07-30 VITALS — BP 130/76 | HR 76 | Temp 99.2°F | Wt 300.2 lb

## 2018-07-30 DIAGNOSIS — H6690 Otitis media, unspecified, unspecified ear: Secondary | ICD-10-CM | POA: Diagnosis not present

## 2018-07-30 DIAGNOSIS — R300 Dysuria: Secondary | ICD-10-CM

## 2018-07-30 DIAGNOSIS — B349 Viral infection, unspecified: Secondary | ICD-10-CM | POA: Diagnosis not present

## 2018-07-30 DIAGNOSIS — J029 Acute pharyngitis, unspecified: Secondary | ICD-10-CM | POA: Diagnosis not present

## 2018-07-30 LAB — POCT URINALYSIS DIP (MANUAL ENTRY)
Bilirubin, UA: NEGATIVE
Blood, UA: NEGATIVE
Glucose, UA: NEGATIVE mg/dL
Ketones, POC UA: NEGATIVE mg/dL
Nitrite, UA: NEGATIVE
Protein Ur, POC: NEGATIVE mg/dL
Spec Grav, UA: 1.015 (ref 1.010–1.025)
Urobilinogen, UA: 0.2 E.U./dL
pH, UA: 6 (ref 5.0–8.0)

## 2018-07-30 LAB — POCT UA - MICROSCOPIC ONLY

## 2018-07-30 LAB — POCT RAPID STREP A (OFFICE): Rapid Strep A Screen: NEGATIVE

## 2018-07-30 MED ORDER — AMOXICILLIN 500 MG PO CAPS: 500.0000 mg | ORAL_CAPSULE | Freq: Three times a day (TID) | ORAL | 0 refills | Status: DC

## 2018-07-30 NOTE — Patient Instructions (Addendum)
Most of what you have is a bad virus.  Drink plenty of fluids and use tylenol or advil as needed to feel better. You do not have a strep throat or a urine infection.   You do have ear infections and I sent in an antibiotic for that.    Please make an appointment at your convenience with Dr. Wonda Olds.  You are due for a physical, blood work, pap smearm mammogram and colonoscopy.

## 2018-07-31 ENCOUNTER — Encounter: Payer: Self-pay | Admitting: Family Medicine

## 2018-07-31 DIAGNOSIS — B349 Viral infection, unspecified: Secondary | ICD-10-CM | POA: Insufficient documentation

## 2018-07-31 NOTE — Progress Notes (Signed)
Established Patient Office Visit  Subjective:  Patient ID: Christina Ray, female    DOB: 1956-05-26  Age: 63 y.o. MRN: [id number]  CC:  Chief Complaint  Patient presents with  . possible flu    HPI Christina Ray presents for multiple symptoms suggesting a viral illness. [name] has been sick for 3-4 days and seems to be getting worse.  Symptoms include: Severe sore throat Bilateral ear pain Subjective fever Nausea Diarrhea Endorses some urinary urgency and frequency.   Mild cough.  Now wheezing.  No hx of COPD Did get flu shot this year  Past Medical History:  Diagnosis Date  . Asthma   . Chronic back pain   . Diverticulitis   . GERD (gastroesophageal reflux disease)   . Hyperlipidemia   . Hypertension     Past Surgical History:  Procedure Laterality Date  . ABDOMINAL HYSTERECTOMY    . Arthroscopic knee surgery Right    X3  . HAND SURGERY Right   . HIP SURGERY Right     Family History  Problem Relation Age of Onset  . Hypertension Other   . Cancer Other   . Congestive Heart Failure Mother 33  . Cardiomyopathy Father        He has an ICD, but no comment of MI.  (Is in his 18s    Social History   Socioeconomic History  . Marital status: Single    Spouse name: Not on file  . Number of children: Not on file  . Years of education: Not on file  . Highest education level: Not on file  Occupational History  . Not on file  Social Needs  . Financial resource strain: Not on file  . Food insecurity:    Worry: Not on file    Inability: Not on file  . Transportation needs:    Medical: Not on file    Non-medical: Not on file  Tobacco Use  . Smoking status: Former Smoker    Types: Cigarettes    Last attempt to quit: 1989    Years since quitting: 31.0  . Smokeless tobacco: Never Used  Substance and Sexual Activity  . Alcohol use: No  . Drug use: No  . Sexual activity: Yes    Birth control/protection: Surgical  Lifestyle  . Physical activity:    Days per week: Not on file    Minutes per session: Not on file  . Stress: Not on file  Relationships  . Social connections:    Talks on phone: Not on file    Gets together: Not on file    Attends religious service: Not on file    Active member of club or organization: Not on file    Attends meetings of clubs or organizations: Not on file    Relationship status: Not on file  . Intimate partner violence:    Fear of current or ex partner: Not on file    Emotionally abused: Not on file    Physically abused: Not on file    Forced sexual activity: Not on file  Other Topics Concern  . 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  Is disabled.  She tries exercise.      3 minutes a day for 12 to 20 minutes doing stationary bike.    Outpatient Medications Prior to Visit  Medication Sig  Dispense Refill  . aspirin EC 81 MG tablet Take 81 mg by mouth daily.    Marland Kitchen. atenolol (TENORMIN) 50 MG tablet TAKE 1 TABLET BY MOUTH EVERY DAY 90 tablet 2  . Cholecalciferol (VITAMIN D PO) Take 1 tablet by mouth daily.    Marland Kitchen. CINNAMON PO Take 1 tablet by mouth daily.    . Cyanocobalamin (VITAMIN B 12 PO) Take 1 tablet by mouth daily.    . fluticasone (FLONASE) 50 MCG/ACT nasal spray Place 2 sprays into both nostrils daily. 16 g 6  . hydrOXYzine (ATARAX/VISTARIL) 25 MG tablet Take 1 tablet (25 mg total) by mouth 3 (three) times daily as needed. 30 tablet 2  . lisinopril-hydrochlorothiazide (PRINZIDE,ZESTORETIC) 20-25 MG tablet TAKE 1 TABLET BY MOUTH EVERY DAY 90 tablet 1  . loratadine (CLARITIN) 10 MG tablet Take 1 tablet (10 mg total) by mouth daily. 90 tablet 3  . montelukast (SINGULAIR) 10 MG tablet TAKE 1 TABLET BY MOUTH EVERYDAY AT BEDTIME 90 tablet 1  . Multiple Vitamin (MULTIVITAMIN) tablet Take 1 tablet by mouth daily.    . naproxen (NAPROSYN) 500 MG tablet Take 1 tablet (500 mg total) by mouth 2 (two)  times daily as needed for moderate pain. 30 tablet 0  . ranitidine (ACID REDUCER) 150 MG tablet Take 1 tablet (150 mg total) by mouth 2 (two) times daily. 60 tablet 2  . sodium chloride (OCEAN) 0.65 % SOLN nasal spray Place 1 spray into both nostrils 2 (two) times daily as needed for congestion.     No facility-administered medications prior to visit.     No Known Allergies  ROS Review of Systems    Objective:    Physical Exam  BP 130/76   Pulse 76   Temp 99.2 F (37.3 C) (Oral)   Wt (!) 300 lb 3.2 oz (136.2 kg)   SpO2 96%   BMI 41.87 kg/m  Wt Readings from Last 3 Encounters:  07/30/18 (!) 300 lb 3.2 oz (136.2 kg)  05/12/18 298 lb 12.8 oz (135.5 kg)  03/05/18 295 lb (133.8 kg)  Left TM markedly red.  Right TM mildly red Throat marked tonsilar enlargement with exudate on left tonsil Neck bilateral adenopathy, minor and non tender.  Lungs clear Cardiac RRR without m or g Abd normal No CVA tenderness. Strep screen negative Urine negative.     Health Maintenance Due  Topic Date Due  . PAP SMEAR-Modifier  09/01/1975  . MAMMOGRAM  08/31/2004  . COLONOSCOPY  08/31/2004    There are no preventive care reminders to display for this patient.   Lab Results  Component Value Date   WBC 4.9 06/21/2017   HGB 13.3 06/21/2017   HCT 40.2 06/21/2017   MCV 91 06/21/2017   PLT 309 06/21/2017   Lab Results  Component Value Date   NA 140 06/21/2017   K 4.3 06/21/2017   CO2 24 06/21/2017   GLUCOSE 81 06/21/2017   BUN 17 06/21/2017   CREATININE 0.78 06/21/2017   BILITOT 0.3 06/21/2017   ALKPHOS 68 06/21/2017   AST 15 06/21/2017   ALT 14 06/21/2017   PROT 7.3 06/21/2017   ALBUMIN 4.2 06/21/2017   CALCIUM 9.6 06/21/2017   ANIONGAP 7 10/30/2016   Lab Results  Component Value Date   CHOL  08/29/2009    190        ATP III CLASSIFICATION:  <200     mg/dL   Desirable  161-096200-239  mg/dL   Borderline High  >=045>=240  mg/dL   High          Lab Results  Component Value  Date   HDL 36 (L) 08/29/2009   Lab Results  Component Value Date   LDLCALC (H) 08/29/2009    136        Total Cholesterol/HDL:CHD Risk Coronary Heart Disease Risk Table                     Men   Women  1/2 Average Risk   3.4   3.3  Average Risk       5.0   4.4  2 X Average Risk   9.6   7.1  3 X Average Risk  23.4   11.0        Use the calculated Patient Ratio above and the CHD Risk Table to determine the patient's CHD Risk.        ATP III CLASSIFICATION (LDL):  <100     mg/dL   Optimal  161-096100-129  mg/dL   Near or Above                    Optimal  130-159  mg/dL   Borderline  045-409160-189  mg/dL   High  >811>190     mg/dL   Very High   Lab Results  Component Value Date   TRIG 92 08/29/2009   Lab Results  Component Value Date   CHOLHDL 5.3 08/29/2009   Lab Results  Component Value Date   HGBA1C 5.4 06/21/2017      Assessment & Plan:   Problem List Items Addressed This Visit    Otitis media   Relevant Medications   amoxicillin (AMOXIL) 500 MG capsule    Other Visit Diagnoses    Sore throat    -  Primary   Relevant Orders   Rapid Strep A (Completed)   Dysuria       Relevant Orders   POCT urinalysis dipstick (Completed)   POCT UA - Microscopic Only (Completed)      Meds ordered this encounter  Medications  . amoxicillin (AMOXIL) 500 MG capsule    Sig: Take 1 capsule (500 mg total) by mouth 3 (three) times daily.    Dispense:  30 capsule    Refill:  0    Follow-up: No follow-ups on file.    Moses MannersWilliam A Damere Brandenburg, MD

## 2018-07-31 NOTE — Assessment & Plan Note (Signed)
Superimposed on predominently a viral syndrome.

## 2018-07-31 NOTE — Assessment & Plan Note (Signed)
Does not sound like influenza given lack of high fever plus GI sx.  So, no tamiflu.  Symptomatic care.

## 2018-08-12 ENCOUNTER — Other Ambulatory Visit: Payer: Self-pay | Admitting: Family Medicine

## 2018-08-14 ENCOUNTER — Other Ambulatory Visit: Payer: Self-pay

## 2018-08-14 MED ORDER — MONTELUKAST SODIUM 10 MG PO TABS: 10.0000 mg | ORAL_TABLET | Freq: Every day | ORAL | 1 refills | Status: DC

## 2018-08-21 ENCOUNTER — Other Ambulatory Visit: Payer: Self-pay | Admitting: Family Medicine

## 2018-09-04 ENCOUNTER — Other Ambulatory Visit: Payer: Self-pay | Admitting: Family Medicine

## 2018-09-04 MED ORDER — MONTELUKAST SODIUM 10 MG PO TABS: ORAL_TABLET | ORAL | 1 refills | Status: DC

## 2018-09-04 NOTE — Telephone Encounter (Signed)
First script did not go thru.  Will resend now.  Christina Ray, Christina Ray, CMA

## 2018-09-04 NOTE — Addendum Note (Signed)
Addended by: Jone Baseman D on: 09/04/2018 11:09 AM   Modules accepted: Orders

## 2018-09-24 DIAGNOSIS — B351 Tinea unguium: Secondary | ICD-10-CM | POA: Diagnosis not present

## 2018-09-24 DIAGNOSIS — M79671 Pain in right foot: Secondary | ICD-10-CM | POA: Diagnosis not present

## 2018-09-24 DIAGNOSIS — M71372 Other bursal cyst, left ankle and foot: Secondary | ICD-10-CM | POA: Diagnosis not present

## 2018-09-24 DIAGNOSIS — L600 Ingrowing nail: Secondary | ICD-10-CM | POA: Diagnosis not present

## 2018-09-24 DIAGNOSIS — M79672 Pain in left foot: Secondary | ICD-10-CM | POA: Diagnosis not present

## 2018-10-08 DIAGNOSIS — J018 Other acute sinusitis: Secondary | ICD-10-CM | POA: Diagnosis not present

## 2018-11-01 DIAGNOSIS — K5790 Diverticulosis of intestine, part unspecified, without perforation or abscess without bleeding: Secondary | ICD-10-CM | POA: Diagnosis not present

## 2018-11-01 DIAGNOSIS — R110 Nausea: Secondary | ICD-10-CM | POA: Diagnosis not present

## 2018-11-01 DIAGNOSIS — R197 Diarrhea, unspecified: Secondary | ICD-10-CM | POA: Diagnosis not present

## 2018-11-01 DIAGNOSIS — R1084 Generalized abdominal pain: Secondary | ICD-10-CM | POA: Diagnosis not present

## 2018-11-07 DIAGNOSIS — R0602 Shortness of breath: Secondary | ICD-10-CM | POA: Diagnosis not present

## 2018-11-07 DIAGNOSIS — Z0000 Encounter for general adult medical examination without abnormal findings: Secondary | ICD-10-CM | POA: Diagnosis not present

## 2018-11-07 DIAGNOSIS — E559 Vitamin D deficiency, unspecified: Secondary | ICD-10-CM | POA: Diagnosis not present

## 2018-11-07 DIAGNOSIS — R5383 Other fatigue: Secondary | ICD-10-CM | POA: Diagnosis not present

## 2018-11-10 DIAGNOSIS — K5900 Constipation, unspecified: Secondary | ICD-10-CM | POA: Diagnosis not present

## 2018-11-19 DIAGNOSIS — R0602 Shortness of breath: Secondary | ICD-10-CM | POA: Diagnosis not present

## 2018-11-19 DIAGNOSIS — R9431 Abnormal electrocardiogram [ECG] [EKG]: Secondary | ICD-10-CM | POA: Diagnosis not present

## 2018-11-20 DIAGNOSIS — R0989 Other specified symptoms and signs involving the circulatory and respiratory systems: Secondary | ICD-10-CM | POA: Diagnosis not present

## 2018-11-20 DIAGNOSIS — R9431 Abnormal electrocardiogram [ECG] [EKG]: Secondary | ICD-10-CM | POA: Diagnosis not present

## 2018-11-20 DIAGNOSIS — I517 Cardiomegaly: Secondary | ICD-10-CM | POA: Diagnosis not present

## 2018-11-21 DIAGNOSIS — E559 Vitamin D deficiency, unspecified: Secondary | ICD-10-CM | POA: Diagnosis not present

## 2018-11-21 DIAGNOSIS — I10 Essential (primary) hypertension: Secondary | ICD-10-CM | POA: Diagnosis not present

## 2018-11-21 DIAGNOSIS — R1084 Generalized abdominal pain: Secondary | ICD-10-CM | POA: Diagnosis not present

## 2018-11-21 DIAGNOSIS — K5900 Constipation, unspecified: Secondary | ICD-10-CM | POA: Diagnosis not present

## 2018-11-27 DIAGNOSIS — R1084 Generalized abdominal pain: Secondary | ICD-10-CM | POA: Diagnosis not present

## 2018-12-17 DIAGNOSIS — J069 Acute upper respiratory infection, unspecified: Secondary | ICD-10-CM | POA: Diagnosis not present

## 2018-12-17 DIAGNOSIS — G8929 Other chronic pain: Secondary | ICD-10-CM | POA: Diagnosis not present

## 2018-12-17 DIAGNOSIS — M549 Dorsalgia, unspecified: Secondary | ICD-10-CM | POA: Diagnosis not present

## 2018-12-17 DIAGNOSIS — R42 Dizziness and giddiness: Secondary | ICD-10-CM | POA: Diagnosis not present

## 2019-02-27 DIAGNOSIS — R110 Nausea: Secondary | ICD-10-CM | POA: Diagnosis not present

## 2019-02-27 DIAGNOSIS — K219 Gastro-esophageal reflux disease without esophagitis: Secondary | ICD-10-CM | POA: Diagnosis not present

## 2019-02-27 DIAGNOSIS — I10 Essential (primary) hypertension: Secondary | ICD-10-CM | POA: Diagnosis not present

## 2019-03-10 DIAGNOSIS — G8929 Other chronic pain: Secondary | ICD-10-CM | POA: Diagnosis not present

## 2019-03-10 DIAGNOSIS — I10 Essential (primary) hypertension: Secondary | ICD-10-CM | POA: Diagnosis not present

## 2019-03-10 DIAGNOSIS — K219 Gastro-esophageal reflux disease without esophagitis: Secondary | ICD-10-CM | POA: Diagnosis not present

## 2019-03-10 DIAGNOSIS — M25551 Pain in right hip: Secondary | ICD-10-CM | POA: Diagnosis not present

## 2019-03-20 ENCOUNTER — Encounter: Payer: Self-pay | Admitting: Gastroenterology

## 2019-04-10 DIAGNOSIS — R590 Localized enlarged lymph nodes: Secondary | ICD-10-CM | POA: Diagnosis not present

## 2019-04-15 ENCOUNTER — Ambulatory Visit: Payer: Medicare HMO | Admitting: Gastroenterology

## 2019-04-15 ENCOUNTER — Encounter: Payer: Self-pay | Admitting: Gastroenterology

## 2019-04-15 VITALS — BP 124/70 | HR 68 | Temp 98.0°F | Ht 69.0 in | Wt 296.0 lb

## 2019-04-15 DIAGNOSIS — Z1211 Encounter for screening for malignant neoplasm of colon: Secondary | ICD-10-CM

## 2019-04-15 MED ORDER — PEG 3350-KCL-NA BICARB-NACL 420 G PO SOLR: 4000.0000 mL | ORAL | 0 refills | Status: AC

## 2019-04-15 NOTE — Patient Instructions (Signed)
You have been scheduled for a colonoscopy. Please follow written instructions given to you at your visit today.  Please pick up your prep supplies at the pharmacy within the next 1-3 days. If you use inhalers (even only as needed), please bring them with you on the day of your procedure. Your physician has requested that you go to www.startemmi.com and enter the access code given to you at your visit today. This web site gives a general overview about your procedure. However, you should still follow specific instructions given to you by our office regarding your preparation for the procedure.  Start taking your Protonix shortly before your breakfast  Thank you for entrusting me with your care and choosing [facility].  Dr Ardis Hughs

## 2019-04-15 NOTE — Progress Notes (Signed)
HPI: This is a very pleasant 63 year old woman who was referred to me by Gregor Hams, FNP  to evaluate chronic GERD.    Chief complaint is chronic GERD  She has had trouble with GERD for decades.  She describes acid taste in the mouth, acid regurgitation, pyrosis.  She was put on proton pump inhibitor Protonix about a month ago and she is "much better" since she started taking it.  She takes it shortly after breakfast every morning.  She has no dysphasia.  Her weight is overall stable.  No family history of esophagus cancer.  She has not had colon cancer screening for at least 10 years.  She believes she had a colonoscopy 10 to 15 years ago somewhere in [place] and was told that it was normal.  No family history of colon cancer  She has morbid obesity  Review of systems: Pertinent positive and negative review of systems were noted in the above HPI section. All other review negative.   Past Medical History:  Diagnosis Date  . Allergy   . Anxiety   . Arthritis   . Asthma   . Chronic back pain   . Diverticulitis   . GERD (gastroesophageal reflux disease)   . Hyperlipidemia   . Hypertension     Past Surgical History:  Procedure Laterality Date  . ABDOMINAL HYSTERECTOMY    . Arthroscopic knee surgery Right    X3  . HAND SURGERY Right   . HIP SURGERY Right     Current Outpatient Medications  Medication Sig Dispense Refill  . amoxicillin-clavulanate (AUGMENTIN) 875-125 MG tablet Take 1 tablet by mouth 2 (two) times daily.    Marland Kitchen aspirin EC 81 MG tablet Take 81 mg by mouth daily.    Marland Kitchen atenolol (TENORMIN) 50 MG tablet TAKE 1 TABLET BY MOUTH EVERY DAY 90 tablet 2  . Cholecalciferol (VITAMIN D PO) Take 1 tablet by mouth daily.    . Cyanocobalamin (VITAMIN B 12 PO) Take 1 tablet by mouth daily.    . fluticasone (FLONASE) 50 MCG/ACT nasal spray Place 2 sprays into both nostrils daily. 16 g 6  . lisinopril-hydrochlorothiazide (PRINZIDE,ZESTORETIC) 20-25 MG tablet TAKE 1 TABLET  BY MOUTH EVERY DAY 90 tablet 1  . montelukast (SINGULAIR) 10 MG tablet TAKE 1 TABLET BY MOUTH EVERYDAY AT BEDTIME 90 tablet 1  . Multiple Vitamin (MULTIVITAMIN) tablet Take 1 tablet by mouth daily.    . naproxen (NAPROSYN) 500 MG tablet Take 1 tablet (500 mg total) by mouth 2 (two) times daily as needed for moderate pain. 30 tablet 0  . pantoprazole (PROTONIX) 40 MG tablet Take 40 mg by mouth daily.    . sodium chloride (OCEAN) 0.65 % SOLN nasal spray Place 1 spray into both nostrils 2 (two) times daily as needed for congestion.     No current facility-administered medications for this visit.     Allergies as of 04/15/2019  . (No Known Allergies)    Family History  Problem Relation Age of Onset  . Hypertension Other   . Cancer Other   . Congestive Heart Failure Mother 51  . Cardiomyopathy Father        He has an ICD, but no comment of MI.  (Is in his 12s    Social History   Socioeconomic History  . Marital status: Single    Spouse name: Not on file  . Number of children: Not on file  . Years of education: Not on file  . Highest education  level: Not on file  Occupational History  . Occupation: retired  Engineer, production  . Financial resource strain: Not on file  . Food insecurity    Worry: Not on file    Inability: Not on file  . Transportation needs    Medical: Not on file    Non-medical: Not on file  Tobacco Use  . Smoking status: Former Smoker    Types: Cigarettes    Quit date: 1989    Years since quitting: 31.7  . Smokeless tobacco: Never Used  Substance and Sexual Activity  . Alcohol use: No  . Drug use: No  . Sexual activity: Yes    Birth control/protection: Surgical  Lifestyle  . Physical activity    Days per week: Not on file    Minutes per session: Not on file  . Stress: Not on file  Relationships  . Social Musician on phone: Not on file    Gets together: Not on file    Attends religious service: Not on file    Active member of club or  organization: Not on file    Attends meetings of clubs or organizations: Not on file    Relationship status: Not on file  . Intimate partner violence    Fear of current or ex partner: Not on file    Emotionally abused: Not on file    Physically abused: Not on file    Forced sexual activity: Not on file  Other Topics Concern  . 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  Is disabled.  She tries exercise.      3 minutes a day for 12 to 20 minutes doing stationary bike.     Physical Exam: BP 124/70   Pulse 68   Temp 98 F (36.7 C)   Ht 5\' 9"  (1.753 m)   Wt 296 lb (134.3 kg)   BMI 43.71 kg/m  Constitutional: generally well-appearing, obese morbid Psychiatric: alert and oriented x3 Eyes: extraocular movements intact Mouth: oral pharynx moist, no lesions Neck: supple no lymphadenopathy Cardiovascular: heart regular rate and rhythm Lungs: clear to auscultation bilaterally Abdomen: soft, nontender, nondistended, no obvious ascites, no peritoneal signs, normal bowel sounds Extremities: no lower extremity edema bilaterally Skin: no lesions on visible extremities   Assessment and plan: 63 y.o. female with routine risk for colon cancer, chronic GERD  Her GERD symptoms are well controlled on proton pump inhibitor once daily.  I recommend she try taking it shortly before breakfast instead of after breakfast as that is the way the pills designed to work most effectively.  I offered upper endoscopy since she has had symptoms for many decades, she declined.  She has no dysphasia no weight loss and so I think it is unlikely she has anything serious going on.  She is at routine risk for colon cancer and has not had colon cancer screening greater than 10 years.  I recommended a colonoscopy at her soonest convenience and she agreed.  I see no reason for any further blood tests or imaging studies  prior to then.    Please see the "Patient Instructions" section for addition details about the plan.   76, MD [facility] 04/15/2019, 3:17 PM  Cc: 04/17/2019, FNP

## 2019-04-22 ENCOUNTER — Telehealth: Payer: Self-pay | Admitting: Gastroenterology

## 2019-04-22 NOTE — Telephone Encounter (Signed)
[facility] calling about gavalyte N is backed order, they want to know if it can be changed to [place].

## 2019-04-22 NOTE — Telephone Encounter (Signed)
Pt is scheduled for colon 05/29/19 and reported that prep soln is on backorder.

## 2019-04-23 NOTE — Telephone Encounter (Signed)
Spoke to pharmacist at [facility] who stated they will have [place] before her colonoscopy next month.

## 2019-05-07 DIAGNOSIS — R69 Illness, unspecified: Secondary | ICD-10-CM | POA: Diagnosis not present

## 2019-05-26 ENCOUNTER — Encounter: Payer: Self-pay | Admitting: Gastroenterology

## 2019-05-27 DIAGNOSIS — K219 Gastro-esophageal reflux disease without esophagitis: Secondary | ICD-10-CM | POA: Diagnosis not present

## 2019-05-29 ENCOUNTER — Encounter: Payer: Medicare HMO | Admitting: Gastroenterology

## 2019-05-29 ENCOUNTER — Telehealth: Payer: Self-pay | Admitting: Gastroenterology

## 2019-05-29 DIAGNOSIS — Z5320 Procedure and treatment not carried out because of patient's decision for unspecified reasons: Secondary | ICD-10-CM | POA: Diagnosis not present

## 2019-05-29 DIAGNOSIS — Z135 Encounter for screening for eye and ear disorders: Secondary | ICD-10-CM | POA: Diagnosis not present

## 2019-05-29 DIAGNOSIS — M25551 Pain in right hip: Secondary | ICD-10-CM | POA: Diagnosis not present

## 2019-05-29 DIAGNOSIS — Z23 Encounter for immunization: Secondary | ICD-10-CM | POA: Diagnosis not present

## 2019-05-29 DIAGNOSIS — Z0000 Encounter for general adult medical examination without abnormal findings: Secondary | ICD-10-CM | POA: Diagnosis not present

## 2019-05-29 DIAGNOSIS — I10 Essential (primary) hypertension: Secondary | ICD-10-CM | POA: Diagnosis not present

## 2019-06-01 DIAGNOSIS — R739 Hyperglycemia, unspecified: Secondary | ICD-10-CM | POA: Diagnosis not present

## 2019-06-09 DIAGNOSIS — Z9189 Other specified personal risk factors, not elsewhere classified: Secondary | ICD-10-CM | POA: Insufficient documentation

## 2019-06-09 DIAGNOSIS — R079 Chest pain, unspecified: Secondary | ICD-10-CM | POA: Diagnosis not present

## 2019-06-09 DIAGNOSIS — I10 Essential (primary) hypertension: Secondary | ICD-10-CM | POA: Diagnosis not present

## 2019-06-15 DIAGNOSIS — R002 Palpitations: Secondary | ICD-10-CM | POA: Diagnosis not present

## 2019-06-15 DIAGNOSIS — I10 Essential (primary) hypertension: Secondary | ICD-10-CM | POA: Diagnosis not present

## 2019-06-15 DIAGNOSIS — R079 Chest pain, unspecified: Secondary | ICD-10-CM | POA: Diagnosis not present

## 2019-06-15 DIAGNOSIS — R42 Dizziness and giddiness: Secondary | ICD-10-CM | POA: Insufficient documentation

## 2019-06-16 DIAGNOSIS — R002 Palpitations: Secondary | ICD-10-CM | POA: Diagnosis not present

## 2019-06-16 DIAGNOSIS — I10 Essential (primary) hypertension: Secondary | ICD-10-CM | POA: Diagnosis not present

## 2019-06-24 DIAGNOSIS — Z20828 Contact with and (suspected) exposure to other viral communicable diseases: Secondary | ICD-10-CM | POA: Diagnosis not present

## 2019-06-29 ENCOUNTER — Ambulatory Visit: Payer: Medicare HMO | Admitting: Gastroenterology

## 2019-06-29 DIAGNOSIS — I10 Essential (primary) hypertension: Secondary | ICD-10-CM | POA: Diagnosis not present

## 2019-06-29 DIAGNOSIS — Z79899 Other long term (current) drug therapy: Secondary | ICD-10-CM | POA: Diagnosis not present

## 2019-06-29 DIAGNOSIS — Z79891 Long term (current) use of opiate analgesic: Secondary | ICD-10-CM | POA: Diagnosis not present

## 2019-06-29 DIAGNOSIS — M1990 Unspecified osteoarthritis, unspecified site: Secondary | ICD-10-CM | POA: Diagnosis not present

## 2019-06-29 DIAGNOSIS — R609 Edema, unspecified: Secondary | ICD-10-CM | POA: Diagnosis not present

## 2019-06-29 DIAGNOSIS — Z809 Family history of malignant neoplasm, unspecified: Secondary | ICD-10-CM | POA: Diagnosis not present

## 2019-06-29 DIAGNOSIS — J45909 Unspecified asthma, uncomplicated: Secondary | ICD-10-CM | POA: Diagnosis not present

## 2019-06-29 DIAGNOSIS — K219 Gastro-esophageal reflux disease without esophagitis: Secondary | ICD-10-CM | POA: Diagnosis not present

## 2019-06-29 DIAGNOSIS — G8929 Other chronic pain: Secondary | ICD-10-CM | POA: Diagnosis not present

## 2019-06-29 DIAGNOSIS — E785 Hyperlipidemia, unspecified: Secondary | ICD-10-CM | POA: Diagnosis not present

## 2019-07-03 DIAGNOSIS — M1611 Unilateral primary osteoarthritis, right hip: Secondary | ICD-10-CM | POA: Diagnosis not present

## 2019-10-09 ENCOUNTER — Ambulatory Visit: Payer: Self-pay | Admitting: Orthopedic Surgery

## 2019-10-12 ENCOUNTER — Other Ambulatory Visit: Payer: Self-pay

## 2019-10-12 ENCOUNTER — Other Ambulatory Visit (HOSPITAL_COMMUNITY)
Admission: RE | Admit: 2019-10-12 | Discharge: 2019-10-12 | Disposition: A | Discharge status: Home / Self Care | Payer: Medicare Other | Source: Ambulatory Visit | Attending: Orthopedic Surgery | Admitting: Orthopedic Surgery

## 2019-10-12 ENCOUNTER — Ambulatory Visit: Payer: Self-pay | Admitting: Orthopedic Surgery

## 2019-10-12 ENCOUNTER — Encounter (HOSPITAL_COMMUNITY): Payer: Self-pay

## 2019-10-12 ENCOUNTER — Encounter (HOSPITAL_COMMUNITY)
Admission: RE | Admit: 2019-10-12 | Discharge: 2019-10-12 | Disposition: A | Discharge status: Home / Self Care | Payer: Medicare Other | Source: Ambulatory Visit | Attending: Orthopedic Surgery | Admitting: Orthopedic Surgery

## 2019-10-12 DIAGNOSIS — Z87891 Personal history of nicotine dependence: Secondary | ICD-10-CM | POA: Diagnosis not present

## 2019-10-12 DIAGNOSIS — M1611 Unilateral primary osteoarthritis, right hip: Secondary | ICD-10-CM | POA: Diagnosis present

## 2019-10-12 DIAGNOSIS — I10 Essential (primary) hypertension: Secondary | ICD-10-CM | POA: Diagnosis not present

## 2019-10-12 DIAGNOSIS — Z01812 Encounter for preprocedural laboratory examination: Secondary | ICD-10-CM | POA: Diagnosis not present

## 2019-10-12 DIAGNOSIS — Z79899 Other long term (current) drug therapy: Secondary | ICD-10-CM | POA: Diagnosis not present

## 2019-10-12 DIAGNOSIS — Z20822 Contact with and (suspected) exposure to covid-19: Secondary | ICD-10-CM | POA: Insufficient documentation

## 2019-10-12 DIAGNOSIS — Z6839 Body mass index (BMI) 39.0-39.9, adult: Secondary | ICD-10-CM | POA: Diagnosis not present

## 2019-10-12 DIAGNOSIS — K219 Gastro-esophageal reflux disease without esophagitis: Secondary | ICD-10-CM | POA: Diagnosis not present

## 2019-10-12 DIAGNOSIS — E785 Hyperlipidemia, unspecified: Secondary | ICD-10-CM | POA: Diagnosis not present

## 2019-10-12 HISTORY — DX: Nausea with vomiting, unspecified: R11.2

## 2019-10-12 HISTORY — DX: Other complications of anesthesia, initial encounter: T88.59XA

## 2019-10-12 HISTORY — DX: Dizziness and giddiness: R42

## 2019-10-12 HISTORY — DX: Nausea with vomiting, unspecified: Z98.890

## 2019-10-12 LAB — SARS CORONAVIRUS 2 (TAT 6-24 HRS): SARS Coronavirus 2: NEGATIVE

## 2019-10-12 LAB — CBC
HCT: 37.6 % (ref 36.0–46.0)
Hemoglobin: 12.1 g/dL (ref 12.0–15.0)
MCH: 30.8 pg (ref 26.0–34.0)
MCHC: 32.2 g/dL (ref 30.0–36.0)
MCV: 95.7 fL (ref 80.0–100.0)
Platelets: 280 10*3/uL (ref 150–400)
RBC: 3.93 MIL/uL (ref 3.87–5.11)
RDW: 13.5 % (ref 11.5–15.5)
WBC: 4.8 10*3/uL (ref 4.0–10.5)
nRBC: 0 % (ref 0.0–0.2)

## 2019-10-12 LAB — URINALYSIS, ROUTINE W REFLEX MICROSCOPIC
Bilirubin Urine: NEGATIVE
Glucose, UA: NEGATIVE mg/dL
Hgb urine dipstick: NEGATIVE
Ketones, ur: NEGATIVE mg/dL
Leukocytes,Ua: NEGATIVE
Nitrite: NEGATIVE
Protein, ur: NEGATIVE mg/dL
Specific Gravity, Urine: 1.017 (ref 1.005–1.030)
pH: 6 (ref 5.0–8.0)

## 2019-10-12 LAB — PROTIME-INR
INR: 1 (ref 0.8–1.2)
Prothrombin Time: 13.5 seconds (ref 11.4–15.2)

## 2019-10-12 LAB — COMPREHENSIVE METABOLIC PANEL
ALT: 14 U/L (ref 0–44)
AST: 18 U/L (ref 15–41)
Albumin: 3.9 g/dL (ref 3.5–5.0)
Alkaline Phosphatase: 60 U/L (ref 38–126)
Anion gap: 10 (ref 5–15)
BUN: 28 mg/dL — ABNORMAL HIGH (ref 8–23)
CO2: 27 mmol/L (ref 22–32)
Calcium: 9.7 mg/dL (ref 8.9–10.3)
Chloride: 102 mmol/L (ref 98–111)
Creatinine, Ser: 1.13 mg/dL — ABNORMAL HIGH (ref 0.44–1.00)
GFR calc Af Amer: 59 mL/min — ABNORMAL LOW (ref >60)
GFR calc non Af Amer: 51 mL/min — ABNORMAL LOW (ref >60)
Glucose, Bld: 92 mg/dL (ref 70–99)
Potassium: 4.1 mmol/L (ref 3.5–5.1)
Sodium: 139 mmol/L (ref 135–145)
Total Bilirubin: 1 mg/dL (ref 0.3–1.2)
Total Protein: 7.4 g/dL (ref 6.5–8.1)

## 2019-10-12 LAB — SURGICAL PCR SCREEN
MRSA, PCR: NEGATIVE
Staphylococcus aureus: NEGATIVE

## 2019-10-12 LAB — ABO/RH: ABO/RH(D): O POS

## 2019-10-12 NOTE — Patient Instructions (Addendum)
DUE TO COVID-19 ONLY ONE VISITOR IS ALLOWED TO COME WITH YOU AND STAY IN THE WAITING ROOM ONLY DURING PRE OP AND PROCEDURE DAY OF SURGERY. THE 1 VISITOR MAY VISIT WITH YOU AFTER SURGERY IN YOUR PRIVATE ROOM DURING VISITING HOURS ONLY!  YOU NEED TO HAVE A COVID 19 TEST ON 10-09-19, THIS TEST MUST BE DONE BEFORE SURGERY, COME  [address], [place] , [address].  [facility]) ONCE YOUR COVID TEST IS COMPLETED, PLEASE BEGIN THE QUARANTINE INSTRUCTIONS AS OUTLINED IN YOUR HANDOUT.                Cassi Jenne Pippen  10/12/2019   Your procedure is scheduled on: 10-14-19   Report to [facility] Main  Entrance    Report to Admitting at  6:00AM     Call this number if you have problems the morning of surgery [phone]    Remember:NO SOLID FOOD AFTER MIDNIGHT THE NIGHT PRIOR TO SURGERY. NOTHING BY MOUTH EXCEPT CLEAR LIQUIDS UNTIL 5:30 AM . PLEASE FINISH ENSURE DRINK PER SURGEON ORDER  WHICH NEEDS TO BE COMPLETED AT 5:30 AM .   CLEAR LIQUID DIET   Foods Allowed                                                                     Foods Excluded  Coffee and tea, regular and decaf                             liquids that you cannot  Plain Jell-O any favor except red or purple                                           see through such as: Fruit ices (not with fruit pulp)                                     milk, soups, orange juice  Iced Popsicles                                    All solid food Carbonated beverages, regular and diet                                    Cranberry, grape and apple juices Sports drinks like Gatorade Lightly seasoned clear broth or consume(fat free) Sugar, honey syrup   _____________________________________________________________________      Take these medicines the morning of surgery with A SIP OF WATER: ATENOLOL, PANTOPRAZOLE (PROTONIX). YOU MAY ALSO USE YOU NASAL SPRAY   BRUSH YOUR TEETH MORNING OF SURGERY AND RINSE YOUR MOUTH  OUT, NO CHEWING GUM CANDY OR MINTS.                                You may not have any  metal on your body including hair pins and              piercings    Do not wear jewelry, make-up, lotions, powders or perfumes, deodorant             Do not wear nail polish on your fingernails.  Do not shave  48 hours prior to surgery.              Do not bring valuables to the hospital.  IS NOT             RESPONSIBLE   FOR VALUABLES.  Contacts, dentures or bridgework may not be worn into surgery.  Leave suitcase in the car. After surgery it may be brought to your room.     Special Instructions: N/A              Please read over the following fact sheets you were given: _____________________________________________________________________             [facility] - Preparing for Surgery Before surgery, you can play an important role.  Because skin is not sterile, your skin needs to be as free of germs as possible.  You can reduce the number of germs on your skin by washing with CHG (chlorahexidine gluconate) soap before surgery.  CHG is an antiseptic cleaner which kills germs and bonds with the skin to continue killing germs even after washing. Please DO NOT use if you have an allergy to CHG or antibacterial soaps.  If your skin becomes reddened/irritated stop using the CHG and inform your nurse when you arrive at Short Stay. Do not shave (including legs and underarms) for at least 48 hours prior to the first CHG shower.  You may shave your face/neck. Please follow these instructions carefully:  1.  Shower with CHG Soap the night before surgery and the  morning of Surgery.  2.  If you choose to wash your hair, wash your hair first as usual with your  normal  shampoo.  3.  After you shampoo, rinse your hair and body thoroughly to remove the  shampoo.                           4.  Use CHG as you would any other liquid soap.  You can apply chg directly  to the skin and wash                        Gently with a scrungie or clean washcloth.  5.  Apply the CHG Soap to your body ONLY FROM THE NECK DOWN.   Do not use on face/ open                           Wound or open sores. Avoid contact with eyes, ears mouth and genitals (private parts).                       Wash face,  Genitals (private parts) with your normal soap.             6.  Wash thoroughly, paying special attention to the area where your surgery  will be performed.  7.  Thoroughly rinse your body with warm water from the neck down.  8.  DO NOT shower/wash with your normal soap after using and rinsing off  the  CHG Soap.                9.  Pat yourself dry with a clean towel.            10.  Wear clean pajamas.            11.  Place clean sheets on your bed the night of your first shower and do not  sleep with pets. Day of Surgery : Do not apply any lotions/deodorants the morning of surgery.  Please wear clean clothes to the hospital/surgery center.  FAILURE TO FOLLOW THESE INSTRUCTIONS MAY RESULT IN THE CANCELLATION OF YOUR SURGERY PATIENT SIGNATURE_________________________________  NURSE SIGNATURE__________________________________  ________________________________________________________________________   Rogelia Mire  An incentive spirometer is a tool that can help keep your lungs clear and active. This tool measures how well you are filling your lungs with each breath. Taking long deep breaths may help reverse or decrease the chance of developing breathing (pulmonary) problems (especially infection) following:  A long period of time when you are unable to move or be active. BEFORE THE PROCEDURE   If the spirometer includes an indicator to show your best effort, your nurse or respiratory therapist will set it to a desired goal.  If possible, sit up straight or lean slightly forward. Try not to slouch.  Hold the incentive spirometer in an upright position. INSTRUCTIONS FOR USE  1. Sit on the edge of  your bed if possible, or sit up as far as you can in bed or on a chair. 2. Hold the incentive spirometer in an upright position. 3. Breathe out normally. 4. Place the mouthpiece in your mouth and seal your lips tightly around it. 5. Breathe in slowly and as deeply as possible, raising the piston or the ball toward the top of the column. 6. Hold your breath for 3-5 seconds or for as long as possible. Allow the piston or ball to fall to the bottom of the column. 7. Remove the mouthpiece from your mouth and breathe out normally. 8. Rest for a few seconds and repeat Steps 1 through 7 at least 10 times every 1-2 hours when you are awake. Take your time and take a few normal breaths between deep breaths. 9. The spirometer may include an indicator to show your best effort. Use the indicator as a goal to work toward during each repetition. 10. After each set of 10 deep breaths, practice coughing to be sure your lungs are clear. If you have an incision (the cut made at the time of surgery), support your incision when coughing by placing a pillow or rolled up towels firmly against it. Once you are able to get out of bed, walk around indoors and cough well. You may stop using the incentive spirometer when instructed by your caregiver.  RISKS AND COMPLICATIONS  Take your time so you do not get dizzy or light-headed.  If you are in pain, you may need to take or ask for pain medication before doing incentive spirometry. It is harder to take a deep breath if you are having pain. AFTER USE  Rest and breathe slowly and easily.  It can be helpful to keep track of a log of your progress. Your caregiver can provide you with a simple table to help with this. If you are using the spirometer at home, follow these instructions: SEEK MEDICAL CARE IF:   You are having difficultly using the spirometer.  You have trouble using the spirometer as often as  instructed.  Your pain medication is not giving enough relief  while using the spirometer.  You develop fever of 100.5 F (38.1 C) or higher. SEEK IMMEDIATE MEDICAL CARE IF:   You cough up bloody sputum that had not been present before.  You develop fever of 102 F (38.9 C) or greater.  You develop worsening pain at or near the incision site. MAKE SURE YOU:   Understand these instructions.  Will watch your condition.  Will get help right away if you are not doing well or get worse. Document Released: 11/19/2006 Document Revised: 10/01/2011 Document Reviewed: 01/20/2007 ExitCare Patient Information 2014 ExitCare, [place].   ________________________________________________________________________  WHAT IS A BLOOD TRANSFUSION? Blood Transfusion Information  A transfusion is the replacement of blood or some of its parts. Blood is made up of multiple cells which provide different functions.  Red blood cells carry oxygen and are used for blood loss replacement.  White blood cells fight against infection.  Platelets control bleeding.  Plasma helps clot blood.  Other blood products are available for specialized needs, such as hemophilia or other clotting disorders. BEFORE THE TRANSFUSION  Who gives blood for transfusions?   Healthy volunteers who are fully evaluated to make sure their blood is safe. This is blood bank blood. Transfusion therapy is the safest it has ever been in the practice of medicine. Before blood is taken from a donor, a complete history is taken to make sure that person has no history of diseases nor engages in risky social behavior (examples are intravenous drug use or sexual activity with multiple partners). The donor's travel history is screened to minimize risk of transmitting infections, such as malaria. The donated blood is tested for signs of infectious diseases, such as HIV and hepatitis. The blood is then tested to be sure it is compatible with you in order to minimize the chance of a transfusion reaction. If you or  a relative donates blood, this is often done in anticipation of surgery and is not appropriate for emergency situations. It takes many days to process the donated blood. RISKS AND COMPLICATIONS Although transfusion therapy is very safe and saves many lives, the main dangers of transfusion include:   Getting an infectious disease.  Developing a transfusion reaction. This is an allergic reaction to something in the blood you were given. Every precaution is taken to prevent this. The decision to have a blood transfusion has been considered carefully by your caregiver before blood is given. Blood is not given unless the benefits outweigh the risks. AFTER THE TRANSFUSION  Right after receiving a blood transfusion, you will usually feel much better and more energetic. This is especially true if your red blood cells have gotten low (anemic). The transfusion raises the level of the red blood cells which carry oxygen, and this usually causes an energy increase.  The nurse administering the transfusion will monitor you carefully for complications. HOME CARE INSTRUCTIONS  No special instructions are needed after a transfusion. You may find your energy is better. Speak with your caregiver about any limitations on activity for underlying diseases you may have. SEEK MEDICAL CARE IF:   Your condition is not improving after your transfusion.  You develop redness or irritation at the intravenous (IV) site. SEEK IMMEDIATE MEDICAL CARE IF:  Any of the following symptoms occur over the next 12 hours:  Shaking chills.  You have a temperature by mouth above 102 F (38.9 C), not controlled by medicine.  Chest, back, or muscle pain.  People around you feel you are not acting correctly or are confused.  Shortness of breath or difficulty breathing.  Dizziness and fainting.  You get a rash or develop hives.  You have a decrease in urine output.  Your urine turns a dark color or changes to pink, red, or  brown. Any of the following symptoms occur over the next 10 days:  You have a temperature by mouth above 102 F (38.9 C), not controlled by medicine.  Shortness of breath.  Weakness after normal activity.  The white part of the eye turns yellow (jaundice).  You have a decrease in the amount of urine or are urinating less often.  Your urine turns a dark color or changes to pink, red, or brown. Document Released: 07/06/2000 Document Revised: 10/01/2011 Document Reviewed: 02/23/2008 [facility] Patient Information 2014 [place].  _______________________________________________________________________

## 2019-10-12 NOTE — H&P (Signed)
TOTAL HIP ADMISSION H&P  Patient is admitted for right total hip arthroplasty.  Subjective:  Chief Complaint: right hip pain  HPI: Christina Ray, 64 y.o. female, has a history of pain and functional disability in the right hip(s) due to arthritis and patient has failed non-surgical conservative treatments for greater than 12 weeks to include NSAID's and/or analgesics, flexibility and strengthening excercises, use of assistive devices, weight reduction as appropriate and activity modification.  Onset of symptoms was gradual starting 3 years ago with gradually worsening course since that time.The patient noted no past surgery on the right hip(s).  Patient currently rates pain in the right hip at 10 out of 10 with activity. Patient has night pain, worsening of pain with activity and weight bearing, pain that interfers with activities of daily living and pain with passive range of motion. Patient has evidence of subchondral cysts, subchondral sclerosis, periarticular osteophytes and joint space narrowing by imaging studies. This condition presents safety issues increasing the risk of falls.   There is no current active infection.  Patient Active Problem List   Diagnosis Date Noted  . Acute viral syndrome 07/31/2018  . Otitis media 07/30/2018  . Nasal congestion 11/13/2017  . Shortness of breath 11/12/2017  . Chest pain with moderate risk for cardiac etiology 11/12/2017  . Other abnormal glucose 06/24/2017  . Palpitations 06/24/2017  . Healthcare maintenance 06/24/2017  . Essential hypertension 04/29/2017  . Anxiety state 04/29/2017  . Seasonal allergies 04/29/2017  . GERD (gastroesophageal reflux disease) 04/29/2017   Past Medical History:  Diagnosis Date  . Allergy   . Anxiety   . Arthritis   . Asthma   . Chronic back pain   . Complication of anesthesia   . Diverticulitis   . GERD (gastroesophageal reflux disease)   . Hyperlipidemia   . Hypertension   . PONV (postoperative nausea  and vomiting)   . Vertigo     Past Surgical History:  Procedure Laterality Date  . ABDOMINAL HYSTERECTOMY    . Arthroscopic knee surgery Right    X3  . HAND SURGERY Right     Current Outpatient Medications  Medication Sig Dispense Refill Last Dose  . atenolol (TENORMIN) 50 MG tablet TAKE 1 TABLET BY MOUTH EVERY DAY (Patient taking differently: Take 50 mg by mouth daily. ) 90 tablet 2   . atorvastatin (LIPITOR) 20 MG tablet Take 20 mg by mouth daily.     . Calcium-Magnesium-Zinc (CAL-MAG-ZINC PO) Take 1 tablet by mouth 2 (two) times a week.     . Cyanocobalamin (VITAMIN B-12) 2500 MCG SUBL Take 2,500 mcg by mouth 2 (two) times a week.     . fluticasone (FLONASE) 50 MCG/ACT nasal spray Place 2 sprays into both nostrils daily. (Patient taking differently: Place 2 sprays into both nostrils daily as needed (allergies.). ) 16 g 6   . hydrochlorothiazide (HYDRODIURIL) 50 MG tablet Take 50 mg by mouth daily.     Marland Kitchen lisinopril (ZESTRIL) 40 MG tablet Take 40 mg by mouth daily.     Marland Kitchen loratadine (CLARITIN) 10 MG tablet Take 10 mg by mouth daily.     . mineral oil light external liquid Apply 1 application topically 2 (two) times daily as needed (inner ear itching.).     Marland Kitchen montelukast (SINGULAIR) 10 MG tablet TAKE 1 TABLET BY MOUTH EVERYDAY AT BEDTIME (Patient taking differently: Take 10 mg by mouth daily. ) 90 tablet 1   . Multiple Vitamin (MULTIVITAMIN WITH MINERALS) TABS tablet Take 1  tablet by mouth daily.     . pantoprazole (PROTONIX) 40 MG tablet Take 40 mg by mouth daily.     . polyethylene glycol-electrolytes (NULYTELY/GOLYTELY) 420 g solution Take 4,000 mLs by mouth as directed. 4000 mL 0   . SIMPLY SALINE NA Place 1 spray into the nose 4 (four) times daily as needed (congestion.).     Marland Kitchen Vitamin D, Ergocalciferol, (DRISDOL) 1.25 MG (50000 UNIT) CAPS capsule Take 50,000 Units by mouth every Monday.      No current facility-administered medications for this visit.   No Known Allergies  Social  History   Tobacco Use  . Smoking status: Former Smoker    Types: Cigarettes    Quit date: 1989    Years since quitting: 32.2  . Smokeless tobacco: Never Used  Substance Use Topics  . Alcohol use: No    Family History  Problem Relation Age of Onset  . Hypertension Other   . Cancer Other   . Congestive Heart Failure Mother 37  . Cardiomyopathy Father        He has an ICD, but no comment of MI.  (Is in his 55s     Review of Systems  Constitutional: Negative.   HENT: Negative.   Eyes: Negative.   Respiratory: Negative.   Cardiovascular: Negative.   Gastrointestinal: Negative.   Endocrine: Negative.   Genitourinary: Negative.   Musculoskeletal: Positive for arthralgias, back pain and myalgias.  Skin: Negative.   Allergic/Immunologic: Negative.   Neurological: Negative.   Hematological: Negative.   Psychiatric/Behavioral: Negative.     Objective:  Physical Exam  Vitals reviewed. Constitutional: She is oriented to person, place, and time. She appears well-developed and well-nourished.  HENT:  Head: Normocephalic and atraumatic.  Eyes: Pupils are equal, round, and reactive to light. Conjunctivae and EOM are normal.  Cardiovascular: Normal rate, regular rhythm and intact distal pulses.  Respiratory: Effort normal. No respiratory distress.  GI: She exhibits no distension.  Genitourinary:    Genitourinary Comments: deferred   Musculoskeletal:     Cervical back: Normal range of motion and neck supple.     Right hip: Bony tenderness present. Decreased range of motion.  Neurological: She is alert and oriented to person, place, and time. She has normal reflexes.  Skin: Skin is warm and dry.  Psychiatric: She has a normal mood and affect. Her behavior is normal. Judgment and thought content normal.    Vital signs in last 24 hours: @VSRANGES @  Labs:   Estimated body mass index is 39.46 kg/m as calculated from the following:   Height as of an earlier encounter on  10/12/19: 5\' 10"  (1.778 m).   Weight as of an earlier encounter on 10/12/19: 124.7 kg.   Imaging Review Plain radiographs demonstrate severe degenerative joint disease of the right hip(s). The bone quality appears to be adequate for age and reported activity level.      Assessment/Plan:  End stage arthritis, right hip(s)  The patient history, physical examination, clinical judgement of the provider and imaging studies are consistent with end stage degenerative joint disease of the right hip(s) and total hip arthroplasty is deemed medically necessary. The treatment options including medical management, injection therapy, arthroscopy and arthroplasty were discussed at length. The risks and benefits of total hip arthroplasty were presented and reviewed. The risks due to aseptic loosening, infection, stiffness, dislocation/subluxation,  thromboembolic complications and other imponderables were discussed.  The patient acknowledged the explanation, agreed to proceed with the plan and consent was  signed. Patient is being admitted for inpatient treatment for surgery, pain control, PT, OT, prophylactic antibiotics, VTE prophylaxis, progressive ambulation and ADL's and discharge planning.The patient is planning to be discharged home with HEP    Patient's anticipated LOS is less than 2 midnights, meeting these requirements: - Younger than 39 - Lives within 1 hour of care - Has a competent adult at home to recover with post-op recover - NO history of  - Chronic pain requiring opiods  - Diabetes  - Coronary Artery Disease  - Heart failure  - Heart attack  - Stroke  - DVT/VTE  - Cardiac arrhythmia  - Respiratory Failure/COPD  - Renal failure  - Anemia  - Advanced Liver disease

## 2019-10-12 NOTE — H&P (View-Only) (Signed)
TOTAL HIP ADMISSION H&P  Patient is admitted for right total hip arthroplasty.  Subjective:  Chief Complaint: right hip pain  HPI: Christina Ray, 64 y.o. female, has a history of pain and functional disability in the right hip(s) due to arthritis and patient has failed non-surgical conservative treatments for greater than 12 weeks to include NSAID's and/or analgesics, flexibility and strengthening excercises, use of assistive devices, weight reduction as appropriate and activity modification.  Onset of symptoms was gradual starting 3 years ago with gradually worsening course since that time.The patient noted no past surgery on the right hip(s).  Patient currently rates pain in the right hip at 10 out of 10 with activity. Patient has night pain, worsening of pain with activity and weight bearing, pain that interfers with activities of daily living and pain with passive range of motion. Patient has evidence of subchondral cysts, subchondral sclerosis, periarticular osteophytes and joint space narrowing by imaging studies. This condition presents safety issues increasing the risk of falls.   There is no current active infection.  Patient Active Problem List   Diagnosis Date Noted  . Acute viral syndrome 07/31/2018  . Otitis media 07/30/2018  . Nasal congestion 11/13/2017  . Shortness of breath 11/12/2017  . Chest pain with moderate risk for cardiac etiology 11/12/2017  . Other abnormal glucose 06/24/2017  . Palpitations 06/24/2017  . Healthcare maintenance 06/24/2017  . Essential hypertension 04/29/2017  . Anxiety state 04/29/2017  . Seasonal allergies 04/29/2017  . GERD (gastroesophageal reflux disease) 04/29/2017   Past Medical History:  Diagnosis Date  . Allergy   . Anxiety   . Arthritis   . Asthma   . Chronic back pain   . Complication of anesthesia   . Diverticulitis   . GERD (gastroesophageal reflux disease)   . Hyperlipidemia   . Hypertension   . PONV (postoperative nausea  and vomiting)   . Vertigo     Past Surgical History:  Procedure Laterality Date  . ABDOMINAL HYSTERECTOMY    . Arthroscopic knee surgery Right    X3  . HAND SURGERY Right     Current Outpatient Medications  Medication Sig Dispense Refill Last Dose  . atenolol (TENORMIN) 50 MG tablet TAKE 1 TABLET BY MOUTH EVERY DAY (Patient taking differently: Take 50 mg by mouth daily. ) 90 tablet 2   . atorvastatin (LIPITOR) 20 MG tablet Take 20 mg by mouth daily.     . Calcium-Magnesium-Zinc (CAL-MAG-ZINC PO) Take 1 tablet by mouth 2 (two) times a week.     . Cyanocobalamin (VITAMIN B-12) 2500 MCG SUBL Take 2,500 mcg by mouth 2 (two) times a week.     . fluticasone (FLONASE) 50 MCG/ACT nasal spray Place 2 sprays into both nostrils daily. (Patient taking differently: Place 2 sprays into both nostrils daily as needed (allergies.). ) 16 g 6   . hydrochlorothiazide (HYDRODIURIL) 50 MG tablet Take 50 mg by mouth daily.     Marland Kitchen lisinopril (ZESTRIL) 40 MG tablet Take 40 mg by mouth daily.     Marland Kitchen loratadine (CLARITIN) 10 MG tablet Take 10 mg by mouth daily.     . mineral oil light external liquid Apply 1 application topically 2 (two) times daily as needed (inner ear itching.).     Marland Kitchen montelukast (SINGULAIR) 10 MG tablet TAKE 1 TABLET BY MOUTH EVERYDAY AT BEDTIME (Patient taking differently: Take 10 mg by mouth daily. ) 90 tablet 1   . Multiple Vitamin (MULTIVITAMIN WITH MINERALS) TABS tablet Take 1  tablet by mouth daily.     . pantoprazole (PROTONIX) 40 MG tablet Take 40 mg by mouth daily.     . polyethylene glycol-electrolytes (NULYTELY/GOLYTELY) 420 g solution Take 4,000 mLs by mouth as directed. 4000 mL 0   . SIMPLY SALINE NA Place 1 spray into the nose 4 (four) times daily as needed (congestion.).     Marland Kitchen Vitamin D, Ergocalciferol, (DRISDOL) 1.25 MG (50000 UNIT) CAPS capsule Take 50,000 Units by mouth every Monday.      No current facility-administered medications for this visit.   No Known Allergies  Social  History   Tobacco Use  . Smoking status: Former Smoker    Types: Cigarettes    Quit date: 1989    Years since quitting: 32.2  . Smokeless tobacco: Never Used  Substance Use Topics  . Alcohol use: No    Family History  Problem Relation Age of Onset  . Hypertension Other   . Cancer Other   . Congestive Heart Failure Mother 37  . Cardiomyopathy Father        He has an ICD, but no comment of MI.  (Is in his 55s     Review of Systems  Constitutional: Negative.   HENT: Negative.   Eyes: Negative.   Respiratory: Negative.   Cardiovascular: Negative.   Gastrointestinal: Negative.   Endocrine: Negative.   Genitourinary: Negative.   Musculoskeletal: Positive for arthralgias, back pain and myalgias.  Skin: Negative.   Allergic/Immunologic: Negative.   Neurological: Negative.   Hematological: Negative.   Psychiatric/Behavioral: Negative.     Objective:  Physical Exam  Vitals reviewed. Constitutional: She is oriented to person, place, and time. She appears well-developed and well-nourished.  HENT:  Head: Normocephalic and atraumatic.  Eyes: Pupils are equal, round, and reactive to light. Conjunctivae and EOM are normal.  Cardiovascular: Normal rate, regular rhythm and intact distal pulses.  Respiratory: Effort normal. No respiratory distress.  GI: She exhibits no distension.  Genitourinary:    Genitourinary Comments: deferred   Musculoskeletal:     Cervical back: Normal range of motion and neck supple.     Right hip: Bony tenderness present. Decreased range of motion.  Neurological: She is alert and oriented to person, place, and time. She has normal reflexes.  Skin: Skin is warm and dry.  Psychiatric: She has a normal mood and affect. Her behavior is normal. Judgment and thought content normal.    Vital signs in last 24 hours: @VSRANGES @  Labs:   Estimated body mass index is 39.46 kg/m as calculated from the following:   Height as of an earlier encounter on  10/12/19: 5\' 10"  (1.778 m).   Weight as of an earlier encounter on 10/12/19: 124.7 kg.   Imaging Review Plain radiographs demonstrate severe degenerative joint disease of the right hip(s). The bone quality appears to be adequate for age and reported activity level.      Assessment/Plan:  End stage arthritis, right hip(s)  The patient history, physical examination, clinical judgement of the provider and imaging studies are consistent with end stage degenerative joint disease of the right hip(s) and total hip arthroplasty is deemed medically necessary. The treatment options including medical management, injection therapy, arthroscopy and arthroplasty were discussed at length. The risks and benefits of total hip arthroplasty were presented and reviewed. The risks due to aseptic loosening, infection, stiffness, dislocation/subluxation,  thromboembolic complications and other imponderables were discussed.  The patient acknowledged the explanation, agreed to proceed with the plan and consent was  signed. Patient is being admitted for inpatient treatment for surgery, pain control, PT, OT, prophylactic antibiotics, VTE prophylaxis, progressive ambulation and ADL's and discharge planning.The patient is planning to be discharged home with HEP    Patient's anticipated LOS is less than 2 midnights, meeting these requirements: - Younger than 65 - Lives within 1 hour of care - Has a competent adult at home to recover with post-op recover - NO history of  - Chronic pain requiring opiods  - Diabetes  - Coronary Artery Disease  - Heart failure  - Heart attack  - Stroke  - DVT/VTE  - Cardiac arrhythmia  - Respiratory Failure/COPD  - Renal failure  - Anemia  - Advanced Liver disease     

## 2019-10-12 NOTE — Progress Notes (Signed)
PCP - Azzie Roup, FNP Cardiologist - Dr. Ronnie Derby  Chest x-ray -  EKG - 08-31-19 (Chart everywhere) Stress Test -  ECHO - 11-21-17  Cardiac Cath -   Sleep Study -  CPAP -   Fasting Blood Sugar -  Checks Blood Sugar _____ times a day  Blood Thinner Instructions: Aspirin Instructions: Last Dose:  Anesthesia review:   Patient denies shortness of breath, fever, cough and chest pain at PAT appointment   Patient verbalized understanding of instructions that were given to them at the PAT appointment. Patient was also instructed that they will need to review over the PAT instructions again at home before surgery.

## 2019-10-13 MED ORDER — DEXTROSE 5 % IV SOLN
3.0000 g | INTRAVENOUS | Status: AC
  Administered 2019-10-14: 3 g via INTRAVENOUS
  Filled 2019-10-13: qty 3

## 2019-10-13 NOTE — Progress Notes (Signed)
Anesthesia Chart Review   Case: [id number] Date/Time: 10/14/19 0815   Procedure: TOTAL HIP ARTHROPLASTY ANTERIOR APPROACH (Right Hip)   Anesthesia type: Spinal   Pre-op diagnosis: Degeneratice joint disease right hip   Location: [facility] / [facility]   Surgeons: Samson Frederic, MD      DISCUSSION:64 y.o. former smoker (quit 07/24/87) with h/o PONV, HTN, GERD, asthma, HLD, vertigo, right hip DJD scheduled for above procedure 10/14/19 with Dr. Samson Frederic.   Pt seen by cardiology 08/31/19 for evaluation of palpitations.  Event monitor worn, mildly symptomatic PVCs, burden 3%.  No syncope.  Stress test 08/2219, low risk study, LVEF 82%.   Anticipate pt can proceed with planned procedure barring acute status change.   VS: There were no vitals taken for this visit.  PROVIDERS: Maudie Flakes, FNP is PCP   Montel Clock, MD is Cardiologist  LABS: Labs reviewed: Acceptable for surgery. (all labs ordered are listed, but only abnormal results are displayed)  Labs Reviewed  COMPREHENSIVE METABOLIC PANEL - Abnormal; Notable for the following components:      Result Value   BUN 28 (*)    Creatinine, Ser 1.13 (*)    GFR calc non Af Amer 51 (*)    GFR calc Af Amer 59 (*)    All other components within normal limits  SURGICAL PCR SCREEN  CBC  PROTIME-INR  URINALYSIS, ROUTINE W REFLEX MICROSCOPIC  TYPE AND SCREEN     IMAGES:   EKG: 08/31/19 (tracing requested) normal sinus rhythm with PVCs   CV: Stress Test 09/14/19 IMPRESSION:  - Moderate-sized slight intensity inferior and mild to moderate intensity apical fixed defect more likely secondary to GI interference and apical thinning and doubtful for small scar. No evidence of ischemia. Prognostically this is a low risk scan. - Left ventricular ejection fraction of 82%. Normal LV size. LV end-diastolic volume 95 mL, end-systolic volume 17 mL. - Normal left ventricular systolic wall motion. Past Medical History:  Diagnosis Date  .  Allergy   . Anxiety   . Arthritis   . Asthma   . Chronic back pain   . Complication of anesthesia   . Diverticulitis   . GERD (gastroesophageal reflux disease)   . Hyperlipidemia   . Hypertension   . PONV (postoperative nausea and vomiting)   . Vertigo     Past Surgical History:  Procedure Laterality Date  . ABDOMINAL HYSTERECTOMY    . Arthroscopic knee surgery Right    X3  . HAND SURGERY Right     MEDICATIONS: . atenolol (TENORMIN) 50 MG tablet  . atorvastatin (LIPITOR) 20 MG tablet  . Calcium-Magnesium-Zinc (CAL-MAG-ZINC PO)  . Cyanocobalamin (VITAMIN B-12) 2500 MCG SUBL  . fluticasone (FLONASE) 50 MCG/ACT nasal spray  . hydrochlorothiazide (HYDRODIURIL) 50 MG tablet  . lisinopril (ZESTRIL) 40 MG tablet  . loratadine (CLARITIN) 10 MG tablet  . mineral oil light external liquid  . montelukast (SINGULAIR) 10 MG tablet  . Multiple Vitamin (MULTIVITAMIN WITH MINERALS) TABS tablet  . pantoprazole (PROTONIX) 40 MG tablet  . polyethylene glycol-electrolytes (NULYTELY/GOLYTELY) 420 g solution  . SIMPLY SALINE NA  . Turmeric 500 MG TABS  . Vitamin D, Ergocalciferol, (DRISDOL) 1.25 MG (50000 UNIT) CAPS capsule   No current facility-administered medications for this encounter.   Melene Muller ON 10/14/2019] ceFAZolin (ANCEF) 3 g in dextrose 5 % 50 mL IVPB   Janey Genta [facility] Pre-Surgical Testing [phone] 10/13/19  10:34 AM

## 2019-10-14 ENCOUNTER — Encounter (HOSPITAL_COMMUNITY)
Admission: RE | Disposition: A | Discharge status: Home / Self Care | Payer: Self-pay | Source: Home / Self Care | Attending: Orthopedic Surgery

## 2019-10-14 ENCOUNTER — Ambulatory Visit (HOSPITAL_COMMUNITY): Payer: Medicare Other

## 2019-10-14 ENCOUNTER — Observation Stay (HOSPITAL_COMMUNITY): Payer: Medicare Other

## 2019-10-14 ENCOUNTER — Other Ambulatory Visit: Payer: Self-pay

## 2019-10-14 ENCOUNTER — Ambulatory Visit (HOSPITAL_COMMUNITY): Payer: Medicare Other | Admitting: Certified Registered Nurse Anesthetist

## 2019-10-14 ENCOUNTER — Observation Stay (HOSPITAL_COMMUNITY)
Admission: RE | Admit: 2019-10-14 | Discharge: 2019-10-15 | Disposition: A | Discharge status: Home / Self Care | Payer: Medicare Other | Attending: Orthopedic Surgery | Admitting: Orthopedic Surgery

## 2019-10-14 ENCOUNTER — Ambulatory Visit (HOSPITAL_COMMUNITY): Payer: Medicare Other | Admitting: Physician Assistant

## 2019-10-14 ENCOUNTER — Encounter (HOSPITAL_COMMUNITY): Payer: Self-pay | Admitting: Orthopedic Surgery

## 2019-10-14 DIAGNOSIS — M1711 Unilateral primary osteoarthritis, right knee: Secondary | ICD-10-CM | POA: Insufficient documentation

## 2019-10-14 DIAGNOSIS — Z87891 Personal history of nicotine dependence: Secondary | ICD-10-CM | POA: Insufficient documentation

## 2019-10-14 DIAGNOSIS — Z09 Encounter for follow-up examination after completed treatment for conditions other than malignant neoplasm: Secondary | ICD-10-CM

## 2019-10-14 DIAGNOSIS — K219 Gastro-esophageal reflux disease without esophagitis: Secondary | ICD-10-CM | POA: Insufficient documentation

## 2019-10-14 DIAGNOSIS — Z20822 Contact with and (suspected) exposure to covid-19: Secondary | ICD-10-CM | POA: Insufficient documentation

## 2019-10-14 DIAGNOSIS — E785 Hyperlipidemia, unspecified: Secondary | ICD-10-CM | POA: Insufficient documentation

## 2019-10-14 DIAGNOSIS — Z01812 Encounter for preprocedural laboratory examination: Secondary | ICD-10-CM | POA: Insufficient documentation

## 2019-10-14 DIAGNOSIS — Z419 Encounter for procedure for purposes other than remedying health state, unspecified: Secondary | ICD-10-CM

## 2019-10-14 DIAGNOSIS — M1611 Unilateral primary osteoarthritis, right hip: Principal | ICD-10-CM | POA: Insufficient documentation

## 2019-10-14 DIAGNOSIS — I10 Essential (primary) hypertension: Secondary | ICD-10-CM | POA: Insufficient documentation

## 2019-10-14 DIAGNOSIS — Z79899 Other long term (current) drug therapy: Secondary | ICD-10-CM | POA: Insufficient documentation

## 2019-10-14 DIAGNOSIS — Z6839 Body mass index (BMI) 39.0-39.9, adult: Secondary | ICD-10-CM | POA: Insufficient documentation

## 2019-10-14 HISTORY — PX: TOTAL HIP ARTHROPLASTY: SHX124

## 2019-10-14 LAB — TYPE AND SCREEN
ABO/RH(D): O POS
Antibody Screen: NEGATIVE

## 2019-10-14 SURGERY — ARTHROPLASTY, HIP, TOTAL, ANTERIOR APPROACH
Anesthesia: Spinal | Site: Hip | Laterality: Right

## 2019-10-14 MED ORDER — SODIUM CHLORIDE 0.9 % IV SOLN: INTRAVENOUS | Status: DC

## 2019-10-14 MED ORDER — PROPOFOL 500 MG/50ML IV EMUL
INTRAVENOUS | Status: DC | PRN
  Administered 2019-10-14: 50 mg via INTRAVENOUS

## 2019-10-14 MED ORDER — CHLORHEXIDINE GLUCONATE 4 % EX LIQD: 60.0000 mL | Freq: Once | CUTANEOUS | Status: DC

## 2019-10-14 MED ORDER — DEXAMETHASONE SODIUM PHOSPHATE 10 MG/ML IJ SOLN
INTRAMUSCULAR | Status: DC | PRN
  Administered 2019-10-14: 10 mg via INTRAVENOUS

## 2019-10-14 MED ORDER — BUPIVACAINE HCL 0.25 % IJ SOLN
INTRAMUSCULAR | Status: AC
  Filled 2019-10-14: qty 1

## 2019-10-14 MED ORDER — METHOCARBAMOL 500 MG IVPB - SIMPLE MED
500.0000 mg | Freq: Four times a day (QID) | INTRAVENOUS | Status: DC | PRN
  Filled 2019-10-14: qty 50

## 2019-10-14 MED ORDER — HYDROCHLOROTHIAZIDE 25 MG PO TABS
50.0000 mg | ORAL_TABLET | Freq: Every day | ORAL | Status: DC
  Administered 2019-10-14: 50 mg via ORAL
  Filled 2019-10-14 (×2): qty 2

## 2019-10-14 MED ORDER — TRANEXAMIC ACID-NACL 1000-0.7 MG/100ML-% IV SOLN
1000.0000 mg | INTRAVENOUS | Status: AC
  Administered 2019-10-14: 1000 mg via INTRAVENOUS

## 2019-10-14 MED ORDER — CEFAZOLIN SODIUM-DEXTROSE 2-4 GM/100ML-% IV SOLN
2.0000 g | Freq: Four times a day (QID) | INTRAVENOUS | Status: AC
  Administered 2019-10-14 (×2): 2 g via INTRAVENOUS
  Filled 2019-10-14 (×2): qty 100

## 2019-10-14 MED ORDER — WATER FOR IRRIGATION, STERILE IR SOLN
Status: DC | PRN
  Administered 2019-10-14: 2000 mL

## 2019-10-14 MED ORDER — METOCLOPRAMIDE HCL 5 MG PO TABS: 5.0000 mg | ORAL_TABLET | Freq: Three times a day (TID) | ORAL | Status: DC | PRN

## 2019-10-14 MED ORDER — SODIUM CHLORIDE 0.9 % IR SOLN
Status: DC | PRN
  Administered 2019-10-14: 1000 mL

## 2019-10-14 MED ORDER — EPHEDRINE SULFATE-NACL 50-0.9 MG/10ML-% IV SOSY
PREFILLED_SYRINGE | INTRAVENOUS | Status: DC | PRN
  Administered 2019-10-14 (×3): 10 mg via INTRAVENOUS

## 2019-10-14 MED ORDER — HYDROMORPHONE HCL 1 MG/ML IJ SOLN: 0.2500 mg | INTRAMUSCULAR | Status: DC | PRN

## 2019-10-14 MED ORDER — ASPIRIN 81 MG PO CHEW
81.0000 mg | CHEWABLE_TABLET | Freq: Two times a day (BID) | ORAL | Status: DC
  Administered 2019-10-14 – 2019-10-15 (×2): 81 mg via ORAL
  Filled 2019-10-14 (×2): qty 1

## 2019-10-14 MED ORDER — ONDANSETRON HCL 4 MG/2ML IJ SOLN: 4.0000 mg | Freq: Four times a day (QID) | INTRAMUSCULAR | Status: DC | PRN

## 2019-10-14 MED ORDER — PANTOPRAZOLE SODIUM 40 MG PO TBEC
40.0000 mg | DELAYED_RELEASE_TABLET | Freq: Every day | ORAL | Status: DC
  Administered 2019-10-15: 40 mg via ORAL
  Filled 2019-10-14: qty 1

## 2019-10-14 MED ORDER — PHENOL 1.4 % MT LIQD: 1.0000 | OROMUCOSAL | Status: DC | PRN

## 2019-10-14 MED ORDER — PHENYLEPHRINE HCL (PRESSORS) 10 MG/ML IV SOLN
INTRAVENOUS | Status: AC
  Filled 2019-10-14: qty 1

## 2019-10-14 MED ORDER — POVIDONE-IODINE 10 % EX SWAB
2.0000 "application " | Freq: Once | CUTANEOUS | Status: AC
  Administered 2019-10-14: 2 via TOPICAL

## 2019-10-14 MED ORDER — MEPERIDINE HCL 50 MG/ML IJ SOLN: 6.2500 mg | INTRAMUSCULAR | Status: DC | PRN

## 2019-10-14 MED ORDER — KETOROLAC TROMETHAMINE 30 MG/ML IJ SOLN
INTRAMUSCULAR | Status: DC | PRN
  Administered 2019-10-14: 30 mg

## 2019-10-14 MED ORDER — METOCLOPRAMIDE HCL 5 MG/ML IJ SOLN: 5.0000 mg | Freq: Three times a day (TID) | INTRAMUSCULAR | Status: DC | PRN

## 2019-10-14 MED ORDER — SODIUM CHLORIDE 0.9 % IR SOLN
Status: DC | PRN
  Administered 2019-10-14: 3000 mL

## 2019-10-14 MED ORDER — POLYETHYLENE GLYCOL 3350 17 G PO PACK: 17.0000 g | PACK | Freq: Every day | ORAL | Status: DC | PRN

## 2019-10-14 MED ORDER — GLYCOPYRROLATE PF 0.2 MG/ML IJ SOSY
PREFILLED_SYRINGE | INTRAMUSCULAR | Status: AC
  Filled 2019-10-14: qty 1

## 2019-10-14 MED ORDER — SODIUM CHLORIDE (PF) 0.9 % IJ SOLN
INTRAMUSCULAR | Status: AC
  Filled 2019-10-14: qty 50

## 2019-10-14 MED ORDER — LIDOCAINE 2% (20 MG/ML) 5 ML SYRINGE
INTRAMUSCULAR | Status: AC
  Filled 2019-10-14: qty 5

## 2019-10-14 MED ORDER — MIDAZOLAM HCL 2 MG/2ML IJ SOLN
INTRAMUSCULAR | Status: DC | PRN
  Administered 2019-10-14: 2 mg via INTRAVENOUS

## 2019-10-14 MED ORDER — PHENYLEPHRINE HCL-NACL 10-0.9 MG/250ML-% IV SOLN
INTRAVENOUS | Status: DC | PRN
  Administered 2019-10-14: 50 ug/min via INTRAVENOUS

## 2019-10-14 MED ORDER — FLUTICASONE PROPIONATE 50 MCG/ACT NA SUSP
2.0000 | Freq: Every day | NASAL | Status: DC | PRN
  Filled 2019-10-14: qty 16

## 2019-10-14 MED ORDER — SENNA 8.6 MG PO TABS
1.0000 | ORAL_TABLET | Freq: Two times a day (BID) | ORAL | Status: DC
  Administered 2019-10-14 – 2019-10-15 (×2): 8.6 mg via ORAL
  Filled 2019-10-14 (×2): qty 1

## 2019-10-14 MED ORDER — ISOPROPYL ALCOHOL 70 % SOLN
Status: AC
  Filled 2019-10-14: qty 480

## 2019-10-14 MED ORDER — KETOROLAC TROMETHAMINE 30 MG/ML IJ SOLN
INTRAMUSCULAR | Status: AC
  Filled 2019-10-14: qty 1

## 2019-10-14 MED ORDER — ACETAMINOPHEN 325 MG PO TABS: 325.0000 mg | ORAL_TABLET | Freq: Four times a day (QID) | ORAL | Status: DC | PRN

## 2019-10-14 MED ORDER — EPHEDRINE 5 MG/ML INJ
INTRAVENOUS | Status: AC
  Filled 2019-10-14: qty 10

## 2019-10-14 MED ORDER — SODIUM CHLORIDE (PF) 0.9 % IJ SOLN
INTRAMUSCULAR | Status: DC | PRN
  Administered 2019-10-14: 50 mL

## 2019-10-14 MED ORDER — ACETAMINOPHEN 10 MG/ML IV SOLN
1000.0000 mg | INTRAVENOUS | Status: AC
  Administered 2019-10-14: 1000 mg via INTRAVENOUS

## 2019-10-14 MED ORDER — ATENOLOL 50 MG PO TABS
50.0000 mg | ORAL_TABLET | Freq: Every day | ORAL | Status: DC
  Administered 2019-10-15: 50 mg via ORAL
  Filled 2019-10-14: qty 1

## 2019-10-14 MED ORDER — MORPHINE SULFATE (PF) 2 MG/ML IV SOLN: 0.5000 mg | INTRAVENOUS | Status: DC | PRN

## 2019-10-14 MED ORDER — ALUM & MAG HYDROXIDE-SIMETH 200-200-20 MG/5ML PO SUSP: 30.0000 mL | ORAL | Status: DC | PRN

## 2019-10-14 MED ORDER — HYDROCODONE-ACETAMINOPHEN 5-325 MG PO TABS
1.0000 | ORAL_TABLET | ORAL | Status: DC | PRN
  Administered 2019-10-14: 1 via ORAL
  Administered 2019-10-15 (×3): 2 via ORAL
  Filled 2019-10-14: qty 2
  Filled 2019-10-14: qty 1
  Filled 2019-10-14 (×2): qty 2

## 2019-10-14 MED ORDER — LISINOPRIL 20 MG PO TABS
40.0000 mg | ORAL_TABLET | Freq: Every day | ORAL | Status: DC
  Administered 2019-10-14: 40 mg via ORAL
  Filled 2019-10-14 (×2): qty 2

## 2019-10-14 MED ORDER — EPHEDRINE SULFATE 50 MG/ML IJ SOLN: 10.0000 mg | Freq: Once | INTRAMUSCULAR | Status: AC

## 2019-10-14 MED ORDER — FENTANYL CITRATE (PF) 100 MCG/2ML IJ SOLN
INTRAMUSCULAR | Status: AC
  Filled 2019-10-14: qty 2

## 2019-10-14 MED ORDER — METHOCARBAMOL 500 MG PO TABS
500.0000 mg | ORAL_TABLET | Freq: Four times a day (QID) | ORAL | Status: DC | PRN
  Administered 2019-10-14 – 2019-10-15 (×2): 500 mg via ORAL
  Filled 2019-10-14 (×2): qty 1

## 2019-10-14 MED ORDER — GLYCOPYRROLATE 0.2 MG/ML IJ SOLN
INTRAMUSCULAR | Status: DC | PRN
  Administered 2019-10-14: .2 mg via INTRAVENOUS

## 2019-10-14 MED ORDER — BUPIVACAINE HCL (PF) 0.25 % IJ SOLN
INTRAMUSCULAR | Status: DC | PRN
  Administered 2019-10-14: 30 mL

## 2019-10-14 MED ORDER — POVIDONE-IODINE 10 % EX SWAB: 2.0000 "application " | Freq: Once | CUTANEOUS | Status: DC

## 2019-10-14 MED ORDER — BUPIVACAINE IN DEXTROSE 0.75-8.25 % IT SOLN
INTRATHECAL | Status: DC | PRN
  Administered 2019-10-14: 2 mL via INTRATHECAL

## 2019-10-14 MED ORDER — ISOPROPYL ALCOHOL 70 % SOLN
Status: DC | PRN
  Administered 2019-10-14: 1 via TOPICAL

## 2019-10-14 MED ORDER — HYDROCODONE-ACETAMINOPHEN 7.5-325 MG PO TABS
1.0000 | ORAL_TABLET | ORAL | Status: DC | PRN
  Administered 2019-10-14: 2 via ORAL
  Filled 2019-10-14: qty 2

## 2019-10-14 MED ORDER — PROPOFOL 1000 MG/100ML IV EMUL
INTRAVENOUS | Status: AC
  Filled 2019-10-14: qty 100

## 2019-10-14 MED ORDER — KETOROLAC TROMETHAMINE 30 MG/ML IJ SOLN: 30.0000 mg | Freq: Once | INTRAMUSCULAR | Status: DC | PRN

## 2019-10-14 MED ORDER — EPHEDRINE SULFATE 50 MG/ML IJ SOLN
INTRAMUSCULAR | Status: AC
  Administered 2019-10-14: 10 mg via INTRAVENOUS
  Filled 2019-10-14: qty 1

## 2019-10-14 MED ORDER — MIDAZOLAM HCL 2 MG/2ML IJ SOLN
INTRAMUSCULAR | Status: AC
  Filled 2019-10-14: qty 2

## 2019-10-14 MED ORDER — ONDANSETRON HCL 4 MG PO TABS: 4.0000 mg | ORAL_TABLET | Freq: Four times a day (QID) | ORAL | Status: DC | PRN

## 2019-10-14 MED ORDER — TRANEXAMIC ACID-NACL 1000-0.7 MG/100ML-% IV SOLN
INTRAVENOUS | Status: AC
  Filled 2019-10-14: qty 100

## 2019-10-14 MED ORDER — ACETAMINOPHEN 10 MG/ML IV SOLN
INTRAVENOUS | Status: AC
  Filled 2019-10-14: qty 100

## 2019-10-14 MED ORDER — PHENYLEPHRINE 40 MCG/ML (10ML) SYRINGE FOR IV PUSH (FOR BLOOD PRESSURE SUPPORT)
PREFILLED_SYRINGE | INTRAVENOUS | Status: DC | PRN
  Administered 2019-10-14 (×2): 80 ug via INTRAVENOUS

## 2019-10-14 MED ORDER — CELECOXIB 200 MG PO CAPS
200.0000 mg | ORAL_CAPSULE | Freq: Two times a day (BID) | ORAL | Status: DC
  Administered 2019-10-14 – 2019-10-15 (×2): 200 mg via ORAL
  Filled 2019-10-14 (×2): qty 1

## 2019-10-14 MED ORDER — MENTHOL 3 MG MT LOZG
1.0000 | LOZENGE | OROMUCOSAL | Status: DC | PRN
  Administered 2019-10-15: 3 mg via ORAL
  Filled 2019-10-14: qty 9

## 2019-10-14 MED ORDER — LORATADINE 10 MG PO TABS
10.0000 mg | ORAL_TABLET | Freq: Every day | ORAL | Status: DC
  Administered 2019-10-14 – 2019-10-15 (×2): 10 mg via ORAL
  Filled 2019-10-14 (×2): qty 1

## 2019-10-14 MED ORDER — LACTATED RINGERS IV SOLN: INTRAVENOUS | Status: DC

## 2019-10-14 MED ORDER — ATORVASTATIN CALCIUM 20 MG PO TABS
20.0000 mg | ORAL_TABLET | Freq: Every day | ORAL | Status: DC
  Administered 2019-10-14 – 2019-10-15 (×2): 20 mg via ORAL
  Filled 2019-10-14 (×2): qty 1

## 2019-10-14 MED ORDER — MONTELUKAST SODIUM 10 MG PO TABS
10.0000 mg | ORAL_TABLET | Freq: Every day | ORAL | Status: DC
  Administered 2019-10-14: 10 mg via ORAL
  Filled 2019-10-14: qty 1

## 2019-10-14 MED ORDER — PROMETHAZINE HCL 25 MG/ML IJ SOLN: 6.2500 mg | INTRAMUSCULAR | Status: DC | PRN

## 2019-10-14 MED ORDER — DIPHENHYDRAMINE HCL 12.5 MG/5ML PO ELIX: 12.5000 mg | ORAL_SOLUTION | ORAL | Status: DC | PRN

## 2019-10-14 MED ORDER — DEXAMETHASONE SODIUM PHOSPHATE 10 MG/ML IJ SOLN
10.0000 mg | Freq: Once | INTRAMUSCULAR | Status: AC
  Administered 2019-10-15: 10 mg via INTRAVENOUS
  Filled 2019-10-14: qty 1

## 2019-10-14 MED ORDER — DOCUSATE SODIUM 100 MG PO CAPS
100.0000 mg | ORAL_CAPSULE | Freq: Two times a day (BID) | ORAL | Status: DC
  Administered 2019-10-14 – 2019-10-15 (×2): 100 mg via ORAL
  Filled 2019-10-14 (×2): qty 1

## 2019-10-14 MED ORDER — ONDANSETRON HCL 4 MG/2ML IJ SOLN
INTRAMUSCULAR | Status: DC | PRN
  Administered 2019-10-14: 4 mg via INTRAVENOUS

## 2019-10-14 SURGICAL SUPPLY — 60 items
ADH SKN CLS APL DERMABOND .7 (GAUZE/BANDAGES/DRESSINGS) ×1
APL PRP STRL LF DISP 70% ISPRP (MISCELLANEOUS) ×1
BAG DECANTER FOR FLEXI CONT (MISCELLANEOUS) IMPLANT
BAG SPEC THK2 15X12 ZIP CLS (MISCELLANEOUS)
BAG ZIPLOCK 12X15 (MISCELLANEOUS) IMPLANT
BLADE SURG SZ10 CARB STEEL (BLADE) IMPLANT
CHLORAPREP W/TINT 26 (MISCELLANEOUS) ×2 IMPLANT
COVER PERINEAL POST (MISCELLANEOUS) ×2 IMPLANT
COVER SURGICAL LIGHT HANDLE (MISCELLANEOUS) ×2 IMPLANT
COVER WAND RF STERILE (DRAPES) IMPLANT
CUP ACET PNNCL SECTR W/GRIP 56 (Hips) IMPLANT
DECANTER SPIKE VIAL GLASS SM (MISCELLANEOUS) ×2 IMPLANT
DERMABOND ADVANCED (GAUZE/BANDAGES/DRESSINGS) ×1
DERMABOND ADVANCED .7 DNX12 (GAUZE/BANDAGES/DRESSINGS) ×2 IMPLANT
DRAPE IMP U-DRAPE 54X76 (DRAPES) ×2 IMPLANT
DRAPE SHEET LG 3/4 BI-LAMINATE (DRAPES) ×6 IMPLANT
DRAPE STERI IOBAN 125X83 (DRAPES) IMPLANT
DRAPE U-SHAPE 47X51 STRL (DRAPES) ×4 IMPLANT
DRSG AQUACEL AG ADV 3.5X10 (GAUZE/BANDAGES/DRESSINGS) ×2 IMPLANT
ELECT REM PT RETURN 15FT ADLT (MISCELLANEOUS) ×2 IMPLANT
GAUZE SPONGE 4X4 12PLY STRL (GAUZE/BANDAGES/DRESSINGS) ×2 IMPLANT
GLOVE BIO SURGEON STRL SZ8.5 (GLOVE) ×4 IMPLANT
GLOVE BIOGEL PI IND STRL 8.5 (GLOVE) ×1 IMPLANT
GLOVE BIOGEL PI INDICATOR 8.5 (GLOVE) ×1
GOWN SPEC L3 XXLG W/TWL (GOWN DISPOSABLE) ×2 IMPLANT
HANDPIECE INTERPULSE COAX TIP (DISPOSABLE) ×2
HEAD CERAMIC 36 PLUS5 (Hips) ×1 IMPLANT
HOLDER FOLEY CATH W/STRAP (MISCELLANEOUS) ×2 IMPLANT
HOOD PEEL AWAY FLYTE STAYCOOL (MISCELLANEOUS) ×8 IMPLANT
JET LAVAGE IRRISEPT WOUND (IRRIGATION / IRRIGATOR) ×2
KIT TURNOVER KIT A (KITS) IMPLANT
LAVAGE JET IRRISEPT WOUND (IRRIGATION / IRRIGATOR) ×1 IMPLANT
LINER NEUTRAL 52MMX36MMX56N (Liner) ×1 IMPLANT
MANIFOLD NEPTUNE II (INSTRUMENTS) ×2 IMPLANT
MARKER SKIN DUAL TIP RULER LAB (MISCELLANEOUS) ×2 IMPLANT
NDL SAFETY ECLIPSE 18X1.5 (NEEDLE) ×1 IMPLANT
NDL SPNL 18GX3.5 QUINCKE PK (NEEDLE) ×1 IMPLANT
NEEDLE HYPO 18GX1.5 SHARP (NEEDLE) ×2
NEEDLE SPNL 18GX3.5 QUINCKE PK (NEEDLE) ×2 IMPLANT
PACK ANTERIOR HIP CUSTOM (KITS) ×2 IMPLANT
PENCIL SMOKE EVACUATOR (MISCELLANEOUS) ×1 IMPLANT
PINN SECTOR W/GRIP ACE CUP 56 (Hips) ×2 IMPLANT
SAW OSC TIP CART 19.5X105X1.3 (SAW) ×2 IMPLANT
SEALER BIPOLAR AQUA 6.0 (INSTRUMENTS) ×2 IMPLANT
SET HNDPC FAN SPRY TIP SCT (DISPOSABLE) ×1 IMPLANT
STEM TRI LOC BPS SZ3 W GRIPTON IMPLANT
SUT ETHIBOND NAB CT1 #1 30IN (SUTURE) ×4 IMPLANT
SUT MNCRL AB 3-0 PS2 18 (SUTURE) ×2 IMPLANT
SUT MNCRL AB 4-0 PS2 18 (SUTURE) ×2 IMPLANT
SUT MON AB 2-0 CT1 36 (SUTURE) ×5 IMPLANT
SUT STRATAFIX PDO 1 14 VIOLET (SUTURE) ×2
SUT STRATFX PDO 1 14 VIOLET (SUTURE) ×1
SUT VIC AB 2-0 CT1 27 (SUTURE) ×2
SUT VIC AB 2-0 CT1 TAPERPNT 27 (SUTURE) ×1 IMPLANT
SUTURE STRATFX PDO 1 14 VIOLET (SUTURE) ×1 IMPLANT
SYR 3ML LL SCALE MARK (SYRINGE) ×2 IMPLANT
TRAY FOLEY MTR SLVR 14FR STAT (SET/KITS/TRAYS/PACK) ×1 IMPLANT
TRI LOC BPS SZ 3 W GRIPTON ×2 IMPLANT
WATER STERILE IRR 1000ML POUR (IV SOLUTION) ×3 IMPLANT
YANKAUER SUCT BULB TIP 10FT TU (MISCELLANEOUS) ×2 IMPLANT

## 2019-10-14 NOTE — Transfer of Care (Signed)
Immediate Anesthesia Transfer of Care Note  Patient: Christina Ray  Procedure(s) Performed: Procedure(s): TOTAL HIP ARTHROPLASTY ANTERIOR APPROACH (Right)  Patient Location: PACU  Anesthesia Type:Spinal  Level of Consciousness: awake, alert  and oriented  Airway & Oxygen Therapy: Patient Spontanous Breathing  Post-op Assessment: Report given to RN and Post -op Vital signs reviewed and stable  Post vital signs: Reviewed and stable  Last Vitals:  Vitals:   10/14/19 0643  BP: 131/75  Pulse: 72  Resp: 18  Temp: 36.7 C  SpO2: 100%    Complications: No apparent anesthesia complications

## 2019-10-14 NOTE — Anesthesia Preprocedure Evaluation (Signed)
Anesthesia Evaluation  Patient identified by MRN, date of birth, ID band Patient awake    Reviewed: Allergy & Precautions, NPO status , Patient's Chart, lab work & pertinent test results  History of Anesthesia Complications (+) PONV and history of anesthetic complications  Airway Mallampati: II       Dental no notable dental hx. (+) Teeth Intact   Pulmonary former smoker,    Pulmonary exam normal breath sounds clear to auscultation       Cardiovascular hypertension, Pt. on medications and Pt. on home beta blockers Normal cardiovascular exam Rhythm:Regular Rate:Normal     Neuro/Psych    GI/Hepatic Neg liver ROS, GERD  Medicated and Controlled,  Endo/Other  Morbid obesity  Renal/GU negative Renal ROS  negative genitourinary   Musculoskeletal   Abdominal (+) + obese,   Peds  Hematology negative hematology ROS (+)   Anesthesia Other Findings   Reproductive/Obstetrics                             Anesthesia Physical Anesthesia Plan  ASA: III  Anesthesia Plan: Spinal   Post-op Pain Management:    Induction:   PONV Risk Score and Plan:   Airway Management Planned: Natural Airway and Nasal Cannula  Additional Equipment: None  Intra-op Plan:   Post-operative Plan:   Informed Consent: I have reviewed the patients History and Physical, chart, labs and discussed the procedure including the risks, benefits and alternatives for the proposed anesthesia with the patient or authorized representative who has indicated his/her understanding and acceptance.       Plan Discussed with: CRNA  Anesthesia Plan Comments:         Anesthesia Quick Evaluation

## 2019-10-14 NOTE — Anesthesia Postprocedure Evaluation (Signed)
Anesthesia Post Note  Patient: Christina Ray  Procedure(s) Performed: TOTAL HIP ARTHROPLASTY ANTERIOR APPROACH (Right Hip)     Patient location during evaluation: PACU Anesthesia Type: Spinal Level of consciousness: awake Pain management: pain level controlled Vital Signs Assessment: post-procedure vital signs reviewed and stable Respiratory status: spontaneous breathing Cardiovascular status: bradycardic Postop Assessment: patient able to bend at knees, no apparent nausea or vomiting, no headache, no backache and spinal receding Anesthetic complications: no    Last Vitals:  Vitals:   10/14/19 1300 10/14/19 1315  BP:    Pulse: (!) 42   Resp:    Temp:  (!) 36.4 C  SpO2:      Last Pain:  Vitals:   10/14/19 1300  TempSrc:   PainSc: 0-No pain   Pain Goal:    LLE Motor Response: Purposeful movement (10/14/19 1245) LLE Sensation: Decreased (10/14/19 1245) RLE Motor Response: Purposeful movement (10/14/19 1300) RLE Sensation: Decreased (10/14/19 1245) L Sensory Level: L3-Anterior knee, lower leg (10/14/19 1245) R Sensory Level: L3-Anterior knee, lower leg (10/14/19 1245) Epidural/Spinal Function Cutaneous sensation: Able to Discern Pressure (10/14/19 1245), Patient able to flex knees: Yes (10/14/19 1245)  Caren Macadam

## 2019-10-14 NOTE — Interval H&P Note (Signed)
History and Physical Interval Note:  10/14/2019 8:31 AM  Christina Ray  has presented today for surgery, with the diagnosis of Degeneratice joint disease right hip.  The various methods of treatment have been discussed with the patient and family. After consideration of risks, benefits and other options for treatment, the patient has consented to  Procedure(s): TOTAL HIP ARTHROPLASTY ANTERIOR APPROACH (Right) as a surgical intervention.  The patient's history has been reviewed, patient examined, no change in status, stable for surgery.  I have reviewed the patient's chart and labs.  Questions were answered to the patient's satisfaction.     Iline Oven [name]

## 2019-10-14 NOTE — Discharge Instructions (Signed)
°Dr. Gizel Riedlinger °Joint Replacement Specialist °[facility] °[address] °[place], [place] [address] °[phone] ° ° °TOTAL HIP REPLACEMENT POSTOPERATIVE DIRECTIONS ° ° ° °Hip Rehabilitation, Guidelines Following Surgery  ° °WEIGHT BEARING °Weight bearing as tolerated with assist device (walker, cane, etc) as directed, use it as long as suggested by your surgeon or therapist, typically at least 4-6 weeks. ° °The results of a hip operation are greatly improved after range of motion and muscle strengthening exercises. Follow all safety measures which are given to protect your hip. If any of these exercises cause increased pain or swelling in your joint, decrease the amount until you are comfortable again. Then slowly increase the exercises. Call your caregiver if you have problems or questions.  ° °HOME CARE INSTRUCTIONS  °Most of the following instructions are designed to prevent the dislocation of your new hip.  °Remove items at home which could result in a fall. This includes throw rugs or furniture in walking pathways.  °Continue medications as instructed at time of discharge. °· You may have some home medications which will be placed on hold until you complete the course of blood thinner medication. °· You may start showering once you are discharged home. Do not remove your dressing. °Do not put on socks or shoes without following the instructions of your caregivers.   °Sit on chairs with arms. Use the chair arms to help push yourself up when arising.  °Arrange for the use of a toilet seat elevator so you are not sitting low.  °· Walk with walker as instructed.  °You may resume a sexual relationship in one month or when given the OK by your caregiver.  °Use walker as long as suggested by your caregivers.  °You may put full weight on your legs and walk as much as is comfortable. °Avoid periods of inactivity such as sitting longer than an hour when not asleep. This helps prevent  blood clots.  °You may return to work once you are cleared by your surgeon.  °Do not drive a car for 6 weeks or until released by your surgeon.  °Do not drive while taking narcotics.  °Wear elastic stockings for two weeks following surgery during the day but you may remove then at night.  °Make sure you keep all of your appointments after your operation with all of your doctors and caregivers. You should call the office at the above phone number and make an appointment for approximately two weeks after the date of your surgery. °Please pick up a stool softener and laxative for home use as long as you are requiring pain medications. °· ICE to the affected hip every three hours for 30 minutes at a time and then as needed for pain and swelling. Continue to use ice on the hip for pain and swelling from surgery. You may notice swelling that will progress down to the foot and ankle.  This is normal after surgery.  Elevate the leg when you are not up walking on it.   °It is important for you to complete the blood thinner medication as prescribed by your doctor. °· Continue to use the breathing machine which will help keep your temperature down.  It is common for your temperature to cycle up and down following surgery, especially at night when you are not up moving around and exerting yourself.  The breathing machine keeps your lungs expanded and your temperature down. ° °RANGE OF MOTION AND STRENGTHENING EXERCISES  °These exercises are   designed to help you keep full movement of your hip joint. Follow your caregiver's or physical therapist's instructions. Perform all exercises about fifteen times, three times per day or as directed. Exercise both hips, even if you have had only one joint replacement. These exercises can be done on a training (exercise) mat, on the floor, on a table or on a bed. Use whatever works the best and is most comfortable for you. Use music or television while you are exercising so that the exercises  are a pleasant break in your day. This will make your life better with the exercises acting as a break in routine you can look forward to.  °Lying on your back, slowly slide your foot toward your buttocks, raising your knee up off the floor. Then slowly slide your foot back down until your leg is straight again.  °Lying on your back spread your legs as far apart as you can without causing discomfort.  °Lying on your side, raise your upper leg and foot straight up from the floor as far as is comfortable. Slowly lower the leg and repeat.  °Lying on your back, tighten up the muscle in the front of your thigh (quadriceps muscles). You can do this by keeping your leg straight and trying to raise your heel off the floor. This helps strengthen the largest muscle supporting your knee.  °Lying on your back, tighten up the muscles of your buttocks both with the legs straight and with the knee bent at a comfortable angle while keeping your heel on the floor.  ° °SKILLED REHAB INSTRUCTIONS: °If the patient is transferred to a skilled rehab facility following release from the hospital, a list of the current medications will be sent to the facility for the patient to continue.  When discharged from the skilled rehab facility, please have the facility set up the patient's Home Health Physical Therapy prior to being released. Also, the skilled facility will be responsible for providing the patient with their medications at time of release from the facility to include their pain medication and their blood thinner medication. If the patient is still at the rehab facility at time of the two week follow up appointment, the skilled rehab facility will also need to assist the patient in arranging follow up appointment in our office and any transportation needs. ° °MAKE SURE YOU:  °Understand these instructions.  °Will watch your condition.  °Will get help right away if you are not doing well or get worse. ° °Pick up stool softner and  laxative for home use following surgery while on pain medications. °Do not remove your dressing. °The dressing is waterproof--it is OK to take showers. °Continue to use ice for pain and swelling after surgery. °Do not use any lotions or creams on the incision until instructed by your surgeon. °Total Hip Protocol. ° ° °

## 2019-10-14 NOTE — Op Note (Signed)
OPERATIVE REPORT  SURGEON: Samson Frederic, MD   ASSISTANT: Skip Mayer, PA-C.  PREOPERATIVE DIAGNOSIS: Right hip arthritis.   POSTOPERATIVE DIAGNOSIS: Right hip arthritis.   PROCEDURE: Right total hip arthroplasty, anterior approach.   IMPLANTS: DePuy Tri Lock stem, size 3, hi offset. DePuy Pinnacle Cup, size 56 mm. DePuy Altrx liner, size 36 by 56 mm, neutral. DePuy Biolox ceramic head ball, size 36 + 5 mm.  ANESTHESIA:  Spinal  ESTIMATED BLOOD LOSS:-200 mL    ANTIBIOTICS: 3 g Ancef.  DRAINS: None.  COMPLICATIONS: None.   CONDITION: PACU - hemodynamically stable.   BRIEF CLINICAL NOTE: Christina Ray is a 64 y.o. female with a long-standing history of Right hip arthritis. After failing conservative management, the patient was indicated for total hip arthroplasty. The risks, benefits, and alternatives to the procedure were explained, and the patient elected to proceed.  PROCEDURE IN DETAIL: Surgical site was marked by myself in the pre-op holding area. Once inside the operating room, spinal anesthesia was obtained, and a foley catheter was inserted. The patient was then positioned on the Hana table.  All bony prominences were well padded.  The hip was prepped and draped in the normal sterile surgical fashion.  A time-out was called verifying side and site of surgery. The patient received IV antibiotics within 60 minutes of beginning the procedure.   The direct anterior approach to the hip was performed through the Hueter interval.  Lateral femoral circumflex vessels were treated with the Auqumantys. The anterior capsule was exposed and an inverted T capsulotomy was made. The femoral neck cut was made to the level of the templated cut.  A corkscrew was placed into the head and the head was removed.  The femoral head was found to have eburnated bone. The head was passed to the back table and was measured.   Acetabular exposure was achieved, and the pulvinar and labrum  were excised. Sequential reaming of the acetabulum was then performed up to a size 55 mm reamer. A 56 mm cup was then opened and impacted into place at approximately 40 degrees of abduction and 20 degrees of anteversion. The final polyethylene liner was impacted into place and acetabular osteophytes were removed.    I then gained femoral exposure taking care to protect the abductors and greater trochanter.  This was performed using standard external rotation, extension, and adduction.  The capsule was peeled off the inner aspect of the greater trochanter, taking care to preserve the short external rotators. A cookie cutter was used to enter the femoral canal, and then the femoral canal finder was placed.  Sequential broaching was performed up to a size 3.  Calcar planer was used on the femoral neck remnant.  I placed a hi offset neck and a trial head ball.  The hip was reduced.  Leg lengths and offset were checked fluoroscopically.  The hip was dislocated and trial components were removed.  The final implants were placed, and the hip was reduced.  Fluoroscopy was used to confirm component position and leg lengths.  At 90 degrees of external rotation and full extension, the hip was stable to an anterior directed force.   The wound was copiously irrigated with Irrisept solution and normal saline using pule lavage.  Marcaine solution was injected into the periarticular soft tissue.  The wound was closed in layers using #1 Stratafix for the fascia, 2-0 Vicryl for the subcutaneous fat, 2-0 Monocryl for the deep dermal layer, 3-0 running Monocryl subcuticular stitch, and  Dermabond for the skin.  Once the glue was fully dried, an Aquacell Ag dressing was applied.  The patient was transported to the recovery room in stable condition.  Sponge, needle, and instrument counts were correct at the end of the case x2.  The patient tolerated the procedure well and there were no known complications.  Please note that a  surgical assistant was a medical necessity for this procedure to perform it in a safe and expeditious manner. Assistant was necessary to provide appropriate retraction of vital neurovascular structures, to prevent femoral fracture, and to allow for anatomic placement of the prosthesis.

## 2019-10-14 NOTE — Evaluation (Signed)
Physical Therapy Evaluation Patient Details Name: Christina Ray MRN: [id number] DOB: [birth year] Today's Date: 10/14/2019   History of Present Illness  Patient is 64 y.o. female s/p Rt THA anterior approach on 10/14/19 with PMH significant for vertigo, HTN, HLD, GERD, OA, anxiety.    Clinical Impression  Christina Ray is a 64 y.o. female POD 0 s/p Rt THA. Patient reports independence with use of SPC for mobility at baseline. Patient is now limited by functional impairments (see PT problem list below) and requires min assist for transfers and gait with RW. Patient was able to ambulate 60 feet with RW and min assist. Patient instructed in exercise to facilitate strengthening and circulation. Patient will benefit from continued skilled PT interventions to address impairments and progress towards PLOF. Acute PT will follow to progress mobility and stair training in preparation for safe discharge home.     Follow Up Recommendations Home health PT;Follow surgeon's recommendation for DC plan and follow-up therapies(pt would like HHPT and a home aid  to assist her at home)    Equipment Recommendations  Rolling walker with 5" wheels;3in1 (PT)    Recommendations for Other Services       Precautions / Restrictions Precautions Precautions: Fall Restrictions Weight Bearing Restrictions: No      Mobility  Bed Mobility Overal bed mobility: Needs Assistance Bed Mobility: Supine to Sit     Supine to sit: Min assist;HOB elevated     General bed mobility comments: cues for use of bed rail, light assist to help with scooting to EOB, pt initiated LE movement without assist.  Transfers Overall transfer level: Needs assistance Equipment used: Rolling walker (2 wheeled) Transfers: Sit to/from Stand Sit to Stand: Min assist;From elevated surface         General transfer comment: cues for safe hand placement/technique, light assist to initiate power up from elevated EOB. pt steady with  rising.  Ambulation/Gait Ambulation/Gait assistance: Min assist;Min guard Gait Distance (Feet): 60 Feet Assistive device: Rolling walker (2 wheeled) Gait Pattern/deviations: Step-to pattern;Decreased stride length;Decreased step length - right;Decreased weight shift to right;Wide base of support Gait velocity: decreased   General Gait Details: cues for safe proximity and step pattern with RW. min assist for walker management and to steady.  Stairs       Wheelchair Mobility    Modified Rankin (Stroke Patients Only)       Balance Overall balance assessment: Needs assistance Sitting-balance support: Feet supported Sitting balance-Leahy Scale: Good     Standing balance support: During functional activity;Bilateral upper extremity supported Standing balance-Leahy Scale: Fair              Pertinent Vitals/Pain Pain Assessment: 0-10 Pain Score: 8  Pain Location: Rt hip Pain Descriptors / Indicators: Aching Pain Intervention(s): Limited activity within patient's tolerance;Monitored during session;Repositioned;Ice applied    Home Living Family/patient expects to be discharged to:: Private residence Living Arrangements: Children Available Help at Discharge: Available PRN/intermittently Type of Home: House Home Access: Stairs to enter Entrance Stairs-Rails: Right Entrance Stairs-Number of Steps: 5 Home Layout: One level Home Equipment: Cane - single point      Prior Function Level of Independence: Independent with assistive device(s)         Comments: pt has been usign SPC to mobilize     Hand Dominance   Dominant Hand: Right    Extremity/Trunk Assessment   Upper Extremity Assessment Upper Extremity Assessment: Overall WFL for tasks assessed    Lower Extremity Assessment Lower Extremity Assessment:  Overall WFL for tasks assessed;RLE deficits/detail RLE Deficits / Details: good quad activation RLE Sensation: WNL RLE Coordination: WNL    Cervical /  Trunk Assessment Cervical / Trunk Assessment: Normal  Communication   Communication: No difficulties  Cognition Arousal/Alertness: Awake/alert Behavior During Therapy: WFL for tasks assessed/performed Overall Cognitive Status: Within Functional Limits for tasks assessed                 General Comments      Exercises Total Joint Exercises Ankle Circles/Pumps: AROM;Both;15 reps;Seated Quad Sets: AROM;Right;5 reps;Seated   Assessment/Plan    PT Assessment Patient needs continued PT services  PT Problem List Decreased strength;Decreased range of motion;Decreased activity tolerance;Decreased balance;Decreased mobility;Decreased knowledge of use of DME;Obesity       PT Treatment Interventions DME instruction;Gait training;Stair training;Functional mobility training;Therapeutic activities;Therapeutic exercise;Balance training;Patient/family education    PT Goals (Current goals can be found in the Care Plan section)  Acute Rehab PT Goals Patient Stated Goal: get walking with less pain PT Goal Formulation: With patient Time For Goal Achievement: 10/21/19 Potential to Achieve Goals: Good    Frequency 7X/week    AM-PAC PT "6 Clicks" Mobility  Outcome Measure Help needed turning from your back to your side while in a flat bed without using bedrails?: A Little Help needed moving from lying on your back to sitting on the side of a flat bed without using bedrails?: A Little Help needed moving to and from a bed to a chair (including a wheelchair)?: A Little Help needed standing up from a chair using your arms (e.g., wheelchair or bedside chair)?: A Little Help needed to walk in hospital room?: A Little Help needed climbing 3-5 steps with a railing? : A Little 6 Click Score: 18    End of Session Equipment Utilized During Treatment: Gait belt Activity Tolerance: Patient tolerated treatment well Patient left: in chair;with call bell/phone within reach;with chair alarm set Nurse  Communication: Mobility status PT Visit Diagnosis: Muscle weakness (generalized) (M62.81);Difficulty in walking, not elsewhere classified (R26.2)    Time: 1761-6073 PT Time Calculation (min) (ACUTE ONLY): 26 min   Charges:   PT Evaluation $PT Eval Low Complexity: 1 Low PT Treatments $Gait Training: 8-22 mins       Verner Mould, DPT Physical Therapist with [facility] [phone]  10/14/2019 3:53 PM

## 2019-10-14 NOTE — Anesthesia Procedure Notes (Signed)
Spinal  Patient location during procedure: OR Start time: 10/14/2019 8:43 AM End time: 10/14/2019 8:46 AM Staffing Performed: anesthesiologist  Anesthesiologist: Leilani Able, MD Preanesthetic Checklist Completed: patient identified, IV checked, site marked, risks and benefits discussed, surgical consent, monitors and equipment checked, pre-op evaluation and timeout performed Spinal Block Patient position: sitting Prep: DuraPrep and site prepped and draped Patient monitoring: continuous pulse ox and blood pressure Approach: midline Location: L3-4 Injection technique: single-shot Needle Needle type: Pencan  Needle gauge: 24 G Needle length: 10 cm Needle insertion depth: 6 cm Assessment Sensory level: T4

## 2019-10-15 ENCOUNTER — Encounter: Payer: Self-pay | Admitting: *Deleted

## 2019-10-15 DIAGNOSIS — M1611 Unilateral primary osteoarthritis, right hip: Secondary | ICD-10-CM | POA: Diagnosis not present

## 2019-10-15 LAB — BASIC METABOLIC PANEL
Anion gap: 8 (ref 5–15)
Anion gap: 12 (ref 5–15)
BUN: 25 mg/dL — ABNORMAL HIGH (ref 8–23)
BUN: 25 mg/dL — ABNORMAL HIGH (ref 8–23)
CO2: 25 mmol/L (ref 22–32)
CO2: 22 mmol/L (ref 22–32)
Calcium: 8.3 mg/dL — ABNORMAL LOW (ref 8.9–10.3)
Calcium: 8.7 mg/dL — ABNORMAL LOW (ref 8.9–10.3)
Chloride: 102 mmol/L (ref 98–111)
Chloride: 104 mmol/L (ref 98–111)
Creatinine, Ser: 1.29 mg/dL — ABNORMAL HIGH (ref 0.44–1.00)
Creatinine, Ser: 1.17 mg/dL — ABNORMAL HIGH (ref 0.44–1.00)
GFR calc Af Amer: 50 mL/min — ABNORMAL LOW (ref >60)
GFR calc Af Amer: 57 mL/min — ABNORMAL LOW (ref >60)
GFR calc non Af Amer: 43 mL/min — ABNORMAL LOW (ref >60)
GFR calc non Af Amer: 49 mL/min — ABNORMAL LOW (ref >60)
Glucose, Bld: 145 mg/dL — ABNORMAL HIGH (ref 70–99)
Glucose, Bld: 121 mg/dL — ABNORMAL HIGH (ref 70–99)
Potassium: 3.7 mmol/L (ref 3.5–5.1)
Potassium: 4.1 mmol/L (ref 3.5–5.1)
Sodium: 135 mmol/L (ref 135–145)
Sodium: 138 mmol/L (ref 135–145)

## 2019-10-15 LAB — CBC
HCT: 30.2 % — ABNORMAL LOW (ref 36.0–46.0)
Hemoglobin: 9.8 g/dL — ABNORMAL LOW (ref 12.0–15.0)
MCH: 30.9 pg (ref 26.0–34.0)
MCHC: 32.5 g/dL (ref 30.0–36.0)
MCV: 95.3 fL (ref 80.0–100.0)
Platelets: 239 10*3/uL (ref 150–400)
RBC: 3.17 MIL/uL — ABNORMAL LOW (ref 3.87–5.11)
RDW: 13.2 % (ref 11.5–15.5)
WBC: 10.3 10*3/uL (ref 4.0–10.5)
nRBC: 0 % (ref 0.0–0.2)

## 2019-10-15 MED ORDER — DOCUSATE SODIUM 100 MG PO CAPS: 100.0000 mg | ORAL_CAPSULE | Freq: Two times a day (BID) | ORAL | 0 refills | Status: AC

## 2019-10-15 MED ORDER — HYDROCODONE-ACETAMINOPHEN 5-325 MG PO TABS: 1.0000 | ORAL_TABLET | ORAL | 0 refills | Status: DC | PRN

## 2019-10-15 MED ORDER — ASPIRIN 81 MG PO CHEW: 81.0000 mg | CHEWABLE_TABLET | Freq: Two times a day (BID) | ORAL | 1 refills | Status: AC

## 2019-10-15 MED ORDER — ONDANSETRON HCL 4 MG PO TABS: 4.0000 mg | ORAL_TABLET | Freq: Four times a day (QID) | ORAL | 0 refills | Status: DC | PRN

## 2019-10-15 MED ORDER — SENNA 8.6 MG PO TABS: 2.0000 | ORAL_TABLET | Freq: Every day | ORAL | 0 refills | Status: DC

## 2019-10-15 MED ORDER — CELECOXIB 200 MG PO CAPS: 200.0000 mg | ORAL_CAPSULE | Freq: Every day | ORAL | 2 refills | Status: DC

## 2019-10-15 NOTE — Plan of Care (Signed)
  Problem: Education: Goal: Knowledge of General Education information will improve Description: Including pain rating scale, medication(s)/side effects and non-pharmacologic comfort measures Outcome: Progressing   Problem: Activity: Goal: Risk for activity intolerance will decrease Outcome: Progressing   Problem: Pain Managment: Goal: General experience of comfort will improve Outcome: Progressing   

## 2019-10-15 NOTE — Progress Notes (Signed)
Physical Therapy Treatment Patient Details Name: Christina Ray MRN: [id number] DOB: [birth year] Today's Date: 10/15/2019    History of Present Illness Patient is 64 y.o. female s/p Rt THA anterior approach on 10/14/19 with PMH significant for vertigo, HTN, HLD, GERD, OA, anxiety.    PT Comments    Pt very motivated and progressing well with mobility.  Pt concerned regarding dc home 2* very limited assist available.  Pt attempted use of reg RW but unable to step completely into same - wide RW used with pt able to step into and achieve more erect posture and progress to reciprocal gait.   Follow Up Recommendations  Home health PT;Follow surgeon's recommendation for DC plan and follow-up therapies     Equipment Recommendations  Rolling walker with 5" wheels;3in1 (PT)(WIDE RW - hip width limits use of reg size RW)    Recommendations for Other Services       Precautions / Restrictions Precautions Precautions: Fall Restrictions Weight Bearing Restrictions: No    Mobility  Bed Mobility Overal bed mobility: Needs Assistance Bed Mobility: Supine to Sit;Sit to Supine     Supine to sit: Min assist Sit to supine: Min assist   General bed mobility comments: cues for sequence, use of L LE to self assist and use of gait belt to assist R LE  Transfers Overall transfer level: Needs assistance Equipment used: Rolling walker (2 wheeled) Transfers: Sit to/from Stand Sit to Stand: Min guard;From elevated surface         General transfer comment: cues for safe hand placement/technique, light assist to initiate power up from elevated EOB. pt steady with rising.  Ambulation/Gait Ambulation/Gait assistance: Min guard Gait Distance (Feet): 125 Feet Assistive device: Rolling walker (2 wheeled) Gait Pattern/deviations: Step-to pattern;Step-through pattern;Decreased step length - right;Decreased step length - left;Shuffle;Trunk flexed Gait velocity: decreased   General Gait Details: cues  for posture, position from RW and initial sequence   Stairs             Wheelchair Mobility    Modified Rankin (Stroke Patients Only)       Balance Overall balance assessment: Needs assistance Sitting-balance support: Feet supported Sitting balance-Leahy Scale: Good     Standing balance support: During functional activity;Bilateral upper extremity supported Standing balance-Leahy Scale: Fair                              Cognition Arousal/Alertness: Awake/alert Behavior During Therapy: WFL for tasks assessed/performed Overall Cognitive Status: Within Functional Limits for tasks assessed                                        Exercises Total Joint Exercises Ankle Circles/Pumps: AROM;Both;15 reps;Seated Quad Sets: AROM;Right;10 reps;Supine Heel Slides: AAROM;Right;20 reps;Supine Hip ABduction/ADduction: AAROM;Right;15 reps;Supine Long Arc Quad: AROM;Right;10 reps;Seated    General Comments        Pertinent Vitals/Pain Pain Assessment: 0-10 Pain Score: 6  Pain Location: Rt hip Pain Descriptors / Indicators: Aching;Sore Pain Intervention(s): Limited activity within patient's tolerance;Monitored during session;Premedicated before session;Ice applied    Home Living                      Prior Function            PT Goals (current goals can now be found in the care plan section) Acute Rehab  PT Goals Patient Stated Goal: get walking with less pain PT Goal Formulation: With patient Time For Goal Achievement: 10/21/19 Potential to Achieve Goals: Good Progress towards PT goals: Progressing toward goals    Frequency    7X/week      PT Plan Current plan remains appropriate    Co-evaluation              AM-PAC PT "6 Clicks" Mobility   Outcome Measure  Help needed turning from your back to your side while in a flat bed without using bedrails?: A Little Help needed moving from lying on your back to sitting on  the side of a flat bed without using bedrails?: A Little Help needed moving to and from a bed to a chair (including a wheelchair)?: A Little Help needed standing up from a chair using your arms (e.g., wheelchair or bedside chair)?: A Little Help needed to walk in hospital room?: A Little Help needed climbing 3-5 steps with a railing? : A Little 6 Click Score: 18    End of Session Equipment Utilized During Treatment: Gait belt Activity Tolerance: Patient tolerated treatment well Patient left: in bed;with call bell/phone within reach;with bed alarm set Nurse Communication: Mobility status PT Visit Diagnosis: Muscle weakness (generalized) (M62.81);Difficulty in walking, not elsewhere classified (R26.2)     Time: 3419-3790 PT Time Calculation (min) (ACUTE ONLY): 49 min  Charges:  $Gait Training: 8-22 mins $Therapeutic Exercise: 8-22 mins $Therapeutic Activity: 8-22 mins                     Debe Coder PT Acute Rehabilitation Services Pager [phone] Office [phone]    Lizza Huffaker 10/15/2019, 12:51 PM

## 2019-10-15 NOTE — Progress Notes (Signed)
Physical Therapy Treatment Patient Details Name: Christina Ray MRN: [id number] DOB: August 17, 1955 Today's Date: 10/15/2019    History of Present Illness Patient is 64 y.o. female s/p Rt THA anterior approach on 10/14/19 with PMH significant for vertigo, HTN, HLD, GERD, OA, anxiety.    PT Comments    Pt progressing well with mobility.  Pt reviewed car transfers, stairs and HEP written program.  Dtr present.   Follow Up Recommendations  Home health PT;Follow surgeon's recommendation for DC plan and follow-up therapies     Equipment Recommendations  Rolling walker with 5" wheels;3in1 (PT)    Recommendations for Other Services       Precautions / Restrictions Precautions Precautions: Fall Restrictions Weight Bearing Restrictions: No    Mobility  Bed Mobility Overal bed mobility: Needs Assistance Bed Mobility: Supine to Sit;Sit to Supine     Supine to sit: Supervision Sit to supine: Supervision   General bed mobility comments: cues for sequence, use of L LE to self assist and use of gait belt to assist R LE  Transfers Overall transfer level: Needs assistance Equipment used: Rolling walker (2 wheeled) Transfers: Sit to/from Stand Sit to Stand: Supervision         General transfer comment: cues for use of UEs to self assist  Ambulation/Gait Ambulation/Gait assistance: Supervision Gait Distance (Feet): 120 Feet Assistive device: Rolling walker (2 wheeled) Gait Pattern/deviations: Step-to pattern;Step-through pattern;Decreased step length - right;Decreased step length - left;Shuffle;Trunk flexed Gait velocity: decreased   General Gait Details: cues for posture, position from RW and initial sequence   Stairs Stairs: Yes Stairs assistance: Min guard Stair Management: One rail Right;Step to pattern;Forwards;With cane Number of Stairs: 5 General stair comments: cues for sequence and foot/cane placement    Wheelchair Mobility    Modified Rankin (Stroke Patients  Only)       Balance Overall balance assessment: Needs assistance Sitting-balance support: Feet supported Sitting balance-Leahy Scale: Good     Standing balance support: During functional activity;Bilateral upper extremity supported Standing balance-Leahy Scale: Fair                              Cognition Arousal/Alertness: Awake/alert Behavior During Therapy: WFL for tasks assessed/performed Overall Cognitive Status: Within Functional Limits for tasks assessed                                        Exercises      General Comments        Pertinent Vitals/Pain Pain Assessment: 0-10 Pain Score: 5  Pain Location: Rt hip Pain Descriptors / Indicators: Aching;Sore Pain Intervention(s): Limited activity within patient's tolerance;Monitored during session;Premedicated before session    Home Living                      Prior Function            PT Goals (current goals can now be found in the care plan section) Acute Rehab PT Goals Patient Stated Goal: get walking with less pain PT Goal Formulation: With patient Time For Goal Achievement: 10/21/19 Potential to Achieve Goals: Good Progress towards PT goals: Progressing toward goals    Frequency    7X/week      PT Plan Current plan remains appropriate    Co-evaluation  AM-PAC PT "6 Clicks" Mobility   Outcome Measure  Help needed turning from your back to your side while in a flat bed without using bedrails?: A Little Help needed moving from lying on your back to sitting on the side of a flat bed without using bedrails?: None Help needed moving to and from a bed to a chair (including a wheelchair)?: A Little Help needed standing up from a chair using your arms (e.g., wheelchair or bedside chair)?: A Little Help needed to walk in hospital room?: A Little Help needed climbing 3-5 steps with a railing? : A Little 6 Click Score: 19    End of Session  Equipment Utilized During Treatment: Gait belt Activity Tolerance: Patient tolerated treatment well Patient left: in bed;with call bell/phone within reach;with bed alarm set Nurse Communication: Mobility status PT Visit Diagnosis: Muscle weakness (generalized) (M62.81);Difficulty in walking, not elsewhere classified (R26.2)     Time: 2761-8485 PT Time Calculation (min) (ACUTE ONLY): 28 min  Charges:  $Gait Training: 8-22 mins $Therapeutic Activity: 8-22 mins                     Mauro Kaufmann PT Acute Rehabilitation Services Pager [phone] Office [phone]    [facility] 10/15/2019, 4:37 PM

## 2019-10-15 NOTE — Discharge Summary (Addendum)
Physician Discharge Summary  Patient ID: Christina Ray MRN: [id number] DOB/AGE: [birth year] 64 y.o.  Admit date: 10/14/2019 Discharge date: 10/15/2019  Admission Diagnoses:  Osteoarthritis of right hip  Discharge Diagnoses:  Principal Problem:   Osteoarthritis of right hip   Past Medical History:  Diagnosis Date  . Allergy   . Anxiety   . Arthritis   . Asthma   . Chronic back pain   . Complication of anesthesia   . Diverticulitis   . GERD (gastroesophageal reflux disease)   . Hyperlipidemia   . Hypertension   . PONV (postoperative nausea and vomiting)   . Vertigo     Surgeries: Procedure(s): TOTAL HIP ARTHROPLASTY ANTERIOR APPROACH on 10/14/2019   Consultants (if any):   Discharged Condition: Improved  Hospital Course: Christina Ray is an 64 y.o. female who was admitted 10/14/2019 with a diagnosis of Osteoarthritis of right hip and went to the operating room on 10/14/2019 and underwent the above named procedures.    She was given perioperative antibiotics:  Anti-infectives (From admission, onward)   Start     Dose/Rate Route Frequency Ordered Stop   10/14/19 1500  ceFAZolin (ANCEF) IVPB 2g/100 mL premix     2 g 200 mL/hr over 30 Minutes Intravenous Every 6 hours 10/14/19 1346 10/14/19 2143   10/14/19 0600  ceFAZolin (ANCEF) 3 g in dextrose 5 % 50 mL IVPB     3 g 100 mL/hr over 30 Minutes Intravenous 30 min pre-op 10/13/19 0751 10/14/19 0841    .  She was given sequential compression devices, early ambulation, and ASA for DVT prophylaxis.  She benefited maximally from the hospital stay and there were no complications.    Recent vital signs:  Vitals:   10/15/19 0949 10/15/19 1348  BP: (!) 120/59 115/70  Pulse: (!) 57 62  Resp: 16 16  Temp: 98.6 F (37 C) 98.1 F (36.7 C)  SpO2: 100% 100%    Recent laboratory studies:  Lab Results  Component Value Date   HGB 9.8 (L) 10/15/2019   HGB 12.1 10/12/2019   HGB 13.3 06/21/2017   Lab Results  Component  Value Date   WBC 10.3 10/15/2019   PLT 239 10/15/2019   Lab Results  Component Value Date   INR 1.0 10/12/2019   Lab Results  Component Value Date   NA 138 10/15/2019   K 4.1 10/15/2019   CL 104 10/15/2019   CO2 22 10/15/2019   BUN 25 (H) 10/15/2019   CREATININE 1.17 (H) 10/15/2019   GLUCOSE 121 (H) 10/15/2019    Discharge Medications:   Allergies as of 10/15/2019   No Known Allergies     Medication List    TAKE these medications   aspirin 81 MG chewable tablet Chew 1 tablet (81 mg total) by mouth 2 (two) times daily.   atenolol 50 MG tablet Commonly known as: TENORMIN TAKE 1 TABLET BY MOUTH EVERY DAY   atorvastatin 20 MG tablet Commonly known as: LIPITOR Take 20 mg by mouth daily.   CAL-MAG-ZINC PO Take 1 tablet by mouth 2 (two) times a week.   celecoxib 200 MG capsule Commonly known as: CELEBREX Take 1 capsule (200 mg total) by mouth daily.   docusate sodium 100 MG capsule Commonly known as: COLACE Take 1 capsule (100 mg total) by mouth 2 (two) times daily.   fluticasone 50 MCG/ACT nasal spray Commonly known as: FLONASE Place 2 sprays into both nostrils daily. What changed:   when to take this  reasons to take this   hydrochlorothiazide 50 MG tablet Commonly known as: HYDRODIURIL Take 50 mg by mouth daily.   HYDROcodone-acetaminophen 5-325 MG tablet Commonly known as: NORCO/VICODIN Take 1 tablet by mouth every 4 (four) hours as needed for moderate pain (pain score 4-6).   lisinopril 40 MG tablet Commonly known as: ZESTRIL Take 40 mg by mouth daily.   loratadine 10 MG tablet Commonly known as: CLARITIN Take 10 mg by mouth daily.   mineral oil light external liquid Apply 1 application topically 2 (two) times daily as needed (inner ear itching.).   montelukast 10 MG tablet Commonly known as: SINGULAIR TAKE 1 TABLET BY MOUTH EVERYDAY AT BEDTIME What changed:   how much to take  how to take this  when to take this  additional  instructions   multivitamin with minerals Tabs tablet Take 1 tablet by mouth daily.   ondansetron 4 MG tablet Commonly known as: ZOFRAN Take 1 tablet (4 mg total) by mouth every 6 (six) hours as needed for nausea.   pantoprazole 40 MG tablet Commonly known as: PROTONIX Take 40 mg by mouth daily.   polyethylene glycol-electrolytes 420 g solution Commonly known as: NuLYTELY Take 4,000 mLs by mouth as directed.   senna 8.6 MG Tabs tablet Commonly known as: SENOKOT Take 2 tablets (17.2 mg total) by mouth at bedtime.   SIMPLY SALINE NA Place 1 spray into the nose 4 (four) times daily as needed (congestion.).   Turmeric 500 MG Tabs Take 500 mg by mouth 2 (two) times a week.   Vitamin B-12 2500 MCG Subl Take 2,500 mcg by mouth 2 (two) times a week.   Vitamin D (Ergocalciferol) 1.25 MG (50000 UNIT) Caps capsule Commonly known as: DRISDOL Take 50,000 Units by mouth every Monday.       Diagnostic Studies: DG Pelvis Portable  Result Date: 10/14/2019 CLINICAL DATA:  Status post right hip replacement EXAM: PORTABLE PELVIS 1-2 VIEWS COMPARISON:  Intraoperative films from earlier in the same day. FINDINGS: Right hip replacement is noted in satisfactory position. No acute bony or soft tissue abnormality is noted. IMPRESSION: Status post right hip replacement. Electronically Signed   By: Alcide Clever M.D.   On: 10/14/2019 12:25   DG C-Arm 1-60 Min-No Report  Result Date: 10/14/2019 Fluoroscopy was utilized by the requesting physician.  No radiographic interpretation.   DG HIP OPERATIVE UNILAT W OR W/O PELVIS RIGHT  Result Date: 10/14/2019 CLINICAL DATA:  Right total hip replacement. EXAM: OPERATIVE RIGHT HIP (WITH PELVIS IF PERFORMED) TECHNIQUE: Fluoroscopic spot image(s) were submitted for interpretation post-operatively. COMPARISON:  None. FINDINGS: Eight fluoroscopic spot views of the pelvis and right hip provided during right hip arthroplasty. Normal hardware alignment. Total  fluoroscopy time 27 seconds. IMPRESSION: Intraoperative fluoroscopy during right hip arthroplasty. Electronically Signed   By: Narda Rutherford M.D.   On: 10/14/2019 11:06    Disposition: Discharge disposition: 01-Home or Self Care       Discharge Instructions    Call MD / Call 911   Complete by: As directed    If you experience chest pain or shortness of breath, CALL 911 and be transported to the hospital emergency room.  If you develope a fever above 101 F, pus (white drainage) or increased drainage or redness at the wound, or calf pain, call your surgeon's office.   Constipation Prevention   Complete by: As directed    Drink plenty of fluids.  Prune juice may be helpful.  You may use  a stool softener, such as Colace (over the counter) 100 mg twice a day.  Use MiraLax (over the counter) for constipation as needed.   Diet - low sodium heart healthy   Complete by: As directed    Driving restrictions   Complete by: As directed    No driving for 6 weeks   Increase activity slowly as tolerated   Complete by: As directed    Lifting restrictions   Complete by: As directed    No lifting for 6 weeks   TED hose   Complete by: As directed    Use stockings (TED hose) for 2 weeks on both leg(s).  You may remove them at night for sleeping.      Follow-up Information    Embree Brawley, Aaron Edelman, MD. Schedule an appointment as soon as possible for a visit in 2 weeks.   Specialty: Orthopedic Surgery Why: For wound re-check Contact information: [address] [place] [address] [phone]            Signed: Hilton Cork Margarita Croke 10/23/2019, 9:51 AM

## 2019-10-15 NOTE — Progress Notes (Signed)
    Subjective:  Patient reports pain as mild to moderate.  Denies N/V/CP/SOB. No c/o  Objective:   VITALS:   Vitals:   10/14/19 2243 10/15/19 0149 10/15/19 0623 10/15/19 0949  BP: (!) 108/50 96/70 (!) 107/47 (!) 120/59  Pulse: (!) 52 71 (!) 52 (!) 57  Resp: 16 17 16 16   Temp: 98.6 F (37 C) 98.3 F (36.8 C) 99 F (37.2 C) 98.6 F (37 C)  TempSrc: Oral Oral Oral Oral  SpO2: 99% 100% 100% 100%  Weight:      Height:      (+) void   NAD ABD soft Sensation intact distally Intact pulses distally Dorsiflexion/Plantar flexion intact Incision: dressing C/D/I Compartment soft   Lab Results  Component Value Date   WBC 10.3 10/15/2019   HGB 9.8 (L) 10/15/2019   HCT 30.2 (L) 10/15/2019   MCV 95.3 10/15/2019   PLT 239 10/15/2019   BMET    Component Value Date/Time   NA 135 10/15/2019 0300   NA 140 06/21/2017 1430   K 3.7 10/15/2019 0300   CL 102 10/15/2019 0300   CO2 25 10/15/2019 0300   GLUCOSE 145 (H) 10/15/2019 0300   BUN 25 (H) 10/15/2019 0300   BUN 17 06/21/2017 1430   CREATININE 1.29 (H) 10/15/2019 0300   CALCIUM 8.3 (L) 10/15/2019 0300   GFRNONAA 43 (L) 10/15/2019 0300   GFRAA 50 (L) 10/15/2019 0300     Assessment/Plan: 1 Day Post-Op   Principal Problem:   Osteoarthritis of right knee Active Problems:   Osteoarthritis of right hip   WBAT with walker DVT ppx: Aspirin, SCDs, TEDS PO pain control PT/OT CKD IIIA: slight elevation in Cr, encourage fluids, recheck BMP at 1300 Dispo: likely d/c home with HEP later today pending BMP   10/17/2019 Donnivan Villena 10/15/2019, 9:59 AM   10/17/2019, MD [phone] [facility] is now [facility]  [place] [address], [address], [place], [place] Phone: [phone] [email or web address] Facebook  [phone]

## 2019-10-26 DIAGNOSIS — Z96641 Presence of right artificial hip joint: Secondary | ICD-10-CM | POA: Insufficient documentation

## 2019-12-07 ENCOUNTER — Other Ambulatory Visit (HOSPITAL_COMMUNITY)
Admission: RE | Admit: 2019-12-07 | Discharge: 2019-12-07 | Disposition: A | Discharge status: Home / Self Care | Payer: Medicare Other | Source: Ambulatory Visit | Attending: Orthopedic Surgery | Admitting: Orthopedic Surgery

## 2019-12-07 DIAGNOSIS — Z20822 Contact with and (suspected) exposure to covid-19: Secondary | ICD-10-CM | POA: Diagnosis not present

## 2019-12-07 DIAGNOSIS — Z01812 Encounter for preprocedural laboratory examination: Secondary | ICD-10-CM | POA: Insufficient documentation

## 2019-12-07 LAB — SARS CORONAVIRUS 2 (TAT 6-24 HRS): SARS Coronavirus 2: NEGATIVE

## 2019-12-07 NOTE — Patient Instructions (Addendum)
DUE TO COVID-19 ONLY ONE VISITOR IS ALLOWED TO COME WITH YOU AND STAY IN THE WAITING ROOM ONLY DURING PRE OP AND PROCEDURE DAY OF SURGERY. THE 1 VISITOR MAY VISIT WITH YOU AFTER SURGERY IN YOUR PRIVATE ROOM DURING VISITING HOURS ONLY!  YOU NEED TO HAVE A COVID 19 TEST ON: 11/07/19, THIS TEST MUST BE DONE BEFORE SURGERY, COME  [address], [place] , [address].  [facility]) ONCE YOUR COVID TEST IS COMPLETED, PLEASE BEGIN THE QUARANTINE INSTRUCTIONS AS OUTLINED IN YOUR HANDOUT.                Christina Ray     Your procedure is scheduled on: 12/10/19   Report to [facility] Main  Entrance   Report to admitting at: 9:00 AM     Call this number if you have problems the morning of surgery [phone]    Remember: Do not eat solid food :After Midnight. Clear liquids from midnight until 8:00 am.    CLEAR LIQUID DIET   Foods Allowed                                                                     Foods Excluded  Coffee and tea, regular and decaf                             liquids that you cannot  Plain Jell-O any favor except red or purple                                           see through such as: Fruit ices (not with fruit pulp)                                     milk, soups, orange juice  Iced Popsicles                                    All solid food Carbonated beverages, regular and diet                                    Cranberry, grape and apple juices Sports drinks like Gatorade Lightly seasoned clear broth or consume(fat free) Sugar, honey syrup  Sample Menu Breakfast                                Lunch                                     Supper Cranberry juice                    Beef broth  Chicken broth Jell-O                                     Grape juice                           Apple juice Coffee or tea                        Jell-O                                      Popsicle                                                 Coffee or tea                        Coffee or tea  _____________________________________________________________________  BRUSH YOUR TEETH MORNING OF SURGERY AND RINSE YOUR MOUTH OUT, NO CHEWING GUM CANDY OR MINTS.     Take these medicines the morning of surgery with A SIP OF WATER: atenolol,loratadine,pantoprazole.Use flonase as usual.                                 You may not have any metal on your body including hair pins and              piercings  Do not wear jewelry, make-up, lotions, powders or perfumes, deodorant             Do not wear nail polish on your fingernails.  Do not shave  48 hours prior to surgery.              Do not bring valuables to the hospital. [facility] IS NOT             RESPONSIBLE   FOR VALUABLES.  Contacts, dentures or bridgework may not be worn into surgery.  Leave suitcase in the car. After surgery it may be brought to your room.     Patients discharged the day of surgery will not be allowed to drive home. IF YOU ARE HAVING SURGERY AND GOING HOME THE SAME DAY, YOU MUST HAVE AN ADULT TO DRIVE YOU HOME AND BE WITH YOU FOR 24 HOURS. YOU MAY GO HOME BY TAXI OR UBER OR ORTHERWISE, BUT AN ADULT MUST ACCOMPANY YOU HOME AND STAY WITH YOU FOR 24 HOURS.  Name and phone number of your driver:  Special Instructions: N/A              Please read over the following fact sheets you were given: _____________________________________________________________________             [facility] - Preparing for Surgery Before surgery, you can play an important role.  Because skin is not sterile, your skin needs to be as free of germs as possible.  You can reduce the number of germs on your skin by washing with CHG (chlorahexidine gluconate) soap before surgery.  CHG is an antiseptic cleaner which kills germs and bonds with the skin to  continue killing germs even after washing. Please DO NOT use if you have an allergy to CHG or antibacterial  soaps.  If your skin becomes reddened/irritated stop using the CHG and inform your nurse when you arrive at Short Stay. Do not shave (including legs and underarms) for at least 48 hours prior to the first CHG shower.  You may shave your face/neck. Please follow these instructions carefully:  1.  Shower with CHG Soap the night before surgery and the  morning of Surgery.  2.  If you choose to wash your hair, wash your hair first as usual with your  normal  shampoo.  3.  After you shampoo, rinse your hair and body thoroughly to remove the  shampoo.                           4.  Use CHG as you would any other liquid soap.  You can apply chg directly  to the skin and wash                       Gently with a scrungie or clean washcloth.  5.  Apply the CHG Soap to your body ONLY FROM THE NECK DOWN.   Do not use on face/ open                           Wound or open sores. Avoid contact with eyes, ears mouth and genitals (private parts).                       Wash face,  Genitals (private parts) with your normal soap.             6.  Wash thoroughly, paying special attention to the area where your surgery  will be performed.  7.  Thoroughly rinse your body with warm water from the neck down.  8.  DO NOT shower/wash with your normal soap after using and rinsing off  the CHG Soap.                9.  Pat yourself dry with a clean towel.            10.  Wear clean pajamas.            11.  Place clean sheets on your bed the night of your first shower and do not  sleep with pets. Day of Surgery : Do not apply any lotions/deodorants the morning of surgery.  Please wear clean clothes to the hospital/surgery center.  FAILURE TO FOLLOW THESE INSTRUCTIONS MAY RESULT IN THE CANCELLATION OF YOUR SURGERY PATIENT SIGNATURE_________________________________  NURSE SIGNATURE__________________________________  ________________________________________________________________________

## 2019-12-07 NOTE — Progress Notes (Signed)
Please place orders for the upcomming surgery.Pt. PST appointment is 12/08/19.Thank you.

## 2019-12-08 ENCOUNTER — Encounter (HOSPITAL_COMMUNITY)
Admission: RE | Admit: 2019-12-08 | Discharge: 2019-12-08 | Disposition: A | Discharge status: Home / Self Care | Payer: Medicare Other | Source: Ambulatory Visit | Attending: Orthopedic Surgery | Admitting: Orthopedic Surgery

## 2019-12-08 ENCOUNTER — Encounter (HOSPITAL_COMMUNITY): Payer: Self-pay

## 2019-12-08 ENCOUNTER — Other Ambulatory Visit: Payer: Self-pay

## 2019-12-08 DIAGNOSIS — Z01812 Encounter for preprocedural laboratory examination: Secondary | ICD-10-CM | POA: Diagnosis present

## 2019-12-08 LAB — CBC
HCT: 33.1 % — ABNORMAL LOW (ref 36.0–46.0)
Hemoglobin: 10.5 g/dL — ABNORMAL LOW (ref 12.0–15.0)
MCH: 30.8 pg (ref 26.0–34.0)
MCHC: 31.7 g/dL (ref 30.0–36.0)
MCV: 97.1 fL (ref 80.0–100.0)
Platelets: 298 10*3/uL (ref 150–400)
RBC: 3.41 MIL/uL — ABNORMAL LOW (ref 3.87–5.11)
RDW: 13.1 % (ref 11.5–15.5)
WBC: 4.4 10*3/uL (ref 4.0–10.5)
nRBC: 0 % (ref 0.0–0.2)

## 2019-12-08 LAB — BASIC METABOLIC PANEL
Anion gap: 9 (ref 5–15)
BUN: 30 mg/dL — ABNORMAL HIGH (ref 8–23)
CO2: 27 mmol/L (ref 22–32)
Calcium: 9 mg/dL (ref 8.9–10.3)
Chloride: 105 mmol/L (ref 98–111)
Creatinine, Ser: 1.01 mg/dL — ABNORMAL HIGH (ref 0.44–1.00)
GFR calc Af Amer: >60 mL/min (ref >60)
GFR calc non Af Amer: 58 mL/min — ABNORMAL LOW (ref >60)
Glucose, Bld: 89 mg/dL (ref 70–99)
Potassium: 3.9 mmol/L (ref 3.5–5.1)
Sodium: 141 mmol/L (ref 135–145)

## 2019-12-08 NOTE — Progress Notes (Signed)
PCP - Azzie Roup.: FNP LOV: 9/23/ Cardiologist - Dr. Royetta Asal  Chest x-ray -  EKG - 08/31/19 CEW Stress Test -  ECHO - 11/21/17 EPIC Cardiac Cath -   Sleep Study -  CPAP -   Fasting Blood Sugar -  Checks Blood Sugar _____ times a day  Blood Thinner Instructions: Aspirin Instructions: Last Dose:  Anesthesia review:   Patient denies shortness of breath, fever, cough and chest pain at PAT appointment   Patient verbalized understanding of instructions that were given to them at the PAT appointment. Patient was also instructed that they will need to review over the PAT instructions again at home before surgery.

## 2019-12-09 NOTE — Anesthesia Preprocedure Evaluation (Addendum)
Anesthesia Evaluation  Patient identified by MRN, date of birth, ID band Patient awake    Reviewed: Allergy & Precautions, NPO status , Patient's Chart, lab work & pertinent test results  History of Anesthesia Complications (+) PONV  Airway Mallampati: I  TM Distance: >3 FB Neck ROM: Full    Dental no notable dental hx. (+) Upper Dentures, Partial Lower   Pulmonary neg pulmonary ROS, shortness of breath, asthma , former smoker,    Pulmonary exam normal breath sounds clear to auscultation       Cardiovascular hypertension, Pt. on medications Normal cardiovascular exam Rhythm:Regular Rate:Normal  2019 Echo Left ventricle: The cavity size was normal. Wall thickness was  increased in a pattern of mild LVH. There was mild focal basal  hypertrophy of the septum. Systolic function was normal. The  estimated ejection fraction was in the range of 60% to 65%. Wall  motion was normal; there were no regional wall motion  abnormalities. Doppler parameters are consistent with abnormal  left ventricular relaxation (grade 1 diastolic dysfunction).    Neuro/Psych Anxiety negative neurological ROS     GI/Hepatic negative GI ROS, Neg liver ROS, GERD  Medicated,  Endo/Other  negative endocrine ROS  Renal/GU negative Renal ROSK+3.9 Cr 1.01     Musculoskeletal negative musculoskeletal ROS (+) Arthritis ,   Abdominal (+) + obese,   Peds  Hematology negative hematology ROS (+) Hgb 10.5   Anesthesia Other Findings   Reproductive/Obstetrics                            Anesthesia Physical Anesthesia Plan  ASA: III  Anesthesia Plan: General   Post-op Pain Management:    Induction: Intravenous  PONV Risk Score and Plan: 4 or greater and Treatment may vary due to age or medical condition, Ondansetron, Dexamethasone and Scopolamine patch - Pre-op  Airway Management Planned: LMA  Additional  Equipment: None  Intra-op Plan:   Post-operative Plan: Extubation in OR  Informed Consent: I have reviewed the patients History and Physical, chart, labs and discussed the procedure including the risks, benefits and alternatives for the proposed anesthesia with the patient or authorized representative who has indicated his/her understanding and acceptance.     Dental advisory given  Plan Discussed with: CRNA  Anesthesia Plan Comments:       Anesthesia Quick Evaluation

## 2019-12-10 ENCOUNTER — Ambulatory Visit (HOSPITAL_COMMUNITY)
Admission: RE | Admit: 2019-12-10 | Discharge: 2019-12-10 | Disposition: A | Discharge status: Home / Self Care | Payer: Medicare Other | Attending: Orthopedic Surgery | Admitting: Orthopedic Surgery

## 2019-12-10 ENCOUNTER — Encounter (HOSPITAL_COMMUNITY): Payer: Self-pay | Admitting: Orthopedic Surgery

## 2019-12-10 ENCOUNTER — Ambulatory Visit (HOSPITAL_COMMUNITY): Payer: Medicare Other | Admitting: Anesthesiology

## 2019-12-10 ENCOUNTER — Encounter (HOSPITAL_COMMUNITY)
Admission: RE | Disposition: A | Discharge status: Home / Self Care | Payer: Self-pay | Source: Home / Self Care | Attending: Orthopedic Surgery

## 2019-12-10 DIAGNOSIS — Z791 Long term (current) use of non-steroidal anti-inflammatories (NSAID): Secondary | ICD-10-CM | POA: Insufficient documentation

## 2019-12-10 DIAGNOSIS — K219 Gastro-esophageal reflux disease without esophagitis: Secondary | ICD-10-CM | POA: Insufficient documentation

## 2019-12-10 DIAGNOSIS — E669 Obesity, unspecified: Secondary | ICD-10-CM | POA: Diagnosis not present

## 2019-12-10 DIAGNOSIS — Z6839 Body mass index (BMI) 39.0-39.9, adult: Secondary | ICD-10-CM | POA: Diagnosis not present

## 2019-12-10 DIAGNOSIS — I10 Essential (primary) hypertension: Secondary | ICD-10-CM | POA: Diagnosis not present

## 2019-12-10 DIAGNOSIS — Z79899 Other long term (current) drug therapy: Secondary | ICD-10-CM | POA: Diagnosis not present

## 2019-12-10 DIAGNOSIS — Z87891 Personal history of nicotine dependence: Secondary | ICD-10-CM | POA: Diagnosis not present

## 2019-12-10 DIAGNOSIS — T8130XA Disruption of wound, unspecified, initial encounter: Secondary | ICD-10-CM | POA: Diagnosis present

## 2019-12-10 DIAGNOSIS — E785 Hyperlipidemia, unspecified: Secondary | ICD-10-CM | POA: Insufficient documentation

## 2019-12-10 DIAGNOSIS — M549 Dorsalgia, unspecified: Secondary | ICD-10-CM | POA: Diagnosis not present

## 2019-12-10 DIAGNOSIS — M1990 Unspecified osteoarthritis, unspecified site: Secondary | ICD-10-CM | POA: Diagnosis not present

## 2019-12-10 DIAGNOSIS — J45909 Unspecified asthma, uncomplicated: Secondary | ICD-10-CM | POA: Diagnosis not present

## 2019-12-10 DIAGNOSIS — X58XXXA Exposure to other specified factors, initial encounter: Secondary | ICD-10-CM | POA: Insufficient documentation

## 2019-12-10 DIAGNOSIS — Z96641 Presence of right artificial hip joint: Secondary | ICD-10-CM | POA: Diagnosis not present

## 2019-12-10 DIAGNOSIS — G8929 Other chronic pain: Secondary | ICD-10-CM | POA: Diagnosis not present

## 2019-12-10 HISTORY — PX: INCISION AND DRAINAGE HIP: SHX1801

## 2019-12-10 LAB — URINALYSIS, ROUTINE W REFLEX MICROSCOPIC
Bilirubin Urine: NEGATIVE
Glucose, UA: NEGATIVE mg/dL
Hgb urine dipstick: NEGATIVE
Ketones, ur: NEGATIVE mg/dL
Leukocytes,Ua: NEGATIVE
Nitrite: NEGATIVE
Protein, ur: NEGATIVE mg/dL
Specific Gravity, Urine: 1.015 (ref 1.005–1.030)
pH: 6 (ref 5.0–8.0)

## 2019-12-10 LAB — COMPREHENSIVE METABOLIC PANEL
ALT: 16 U/L (ref 0–44)
AST: 18 U/L (ref 15–41)
Albumin: 3.6 g/dL (ref 3.5–5.0)
Alkaline Phosphatase: 73 U/L (ref 38–126)
Anion gap: 9 (ref 5–15)
BUN: 26 mg/dL — ABNORMAL HIGH (ref 8–23)
CO2: 25 mmol/L (ref 22–32)
Calcium: 9 mg/dL (ref 8.9–10.3)
Chloride: 103 mmol/L (ref 98–111)
Creatinine, Ser: 0.91 mg/dL (ref 0.44–1.00)
GFR calc Af Amer: >60 mL/min (ref >60)
GFR calc non Af Amer: >60 mL/min (ref >60)
Glucose, Bld: 91 mg/dL (ref 70–99)
Potassium: 3.9 mmol/L (ref 3.5–5.1)
Sodium: 137 mmol/L (ref 135–145)
Total Bilirubin: 0.7 mg/dL (ref 0.3–1.2)
Total Protein: 7.3 g/dL (ref 6.5–8.1)

## 2019-12-10 LAB — TYPE AND SCREEN
ABO/RH(D): O POS
Antibody Screen: NEGATIVE

## 2019-12-10 LAB — CBC
HCT: 33.1 % — ABNORMAL LOW (ref 36.0–46.0)
Hemoglobin: 10.5 g/dL — ABNORMAL LOW (ref 12.0–15.0)
MCH: 30.5 pg (ref 26.0–34.0)
MCHC: 31.7 g/dL (ref 30.0–36.0)
MCV: 96.2 fL (ref 80.0–100.0)
Platelets: 297 10*3/uL (ref 150–400)
RBC: 3.44 MIL/uL — ABNORMAL LOW (ref 3.87–5.11)
RDW: 13 % (ref 11.5–15.5)
WBC: 4.9 10*3/uL (ref 4.0–10.5)
nRBC: 0 % (ref 0.0–0.2)

## 2019-12-10 LAB — PROTIME-INR
INR: 1.1 (ref 0.8–1.2)
Prothrombin Time: 13.8 seconds (ref 11.4–15.2)

## 2019-12-10 SURGERY — IRRIGATION AND DEBRIDEMENT HIP
Anesthesia: General | Site: Hip | Laterality: Right

## 2019-12-10 MED ORDER — CEFAZOLIN SODIUM-DEXTROSE 2-4 GM/100ML-% IV SOLN
INTRAVENOUS | Status: AC
  Filled 2019-12-10: qty 100

## 2019-12-10 MED ORDER — CEFADROXIL 500 MG PO CAPS: 500.0000 mg | ORAL_CAPSULE | Freq: Two times a day (BID) | ORAL | 0 refills | Status: DC

## 2019-12-10 MED ORDER — HYDROMORPHONE HCL 1 MG/ML IJ SOLN
0.2500 mg | INTRAMUSCULAR | Status: DC | PRN
  Administered 2019-12-10 (×2): 0.5 mg via INTRAVENOUS

## 2019-12-10 MED ORDER — SODIUM CHLORIDE 0.9 % IR SOLN
Status: DC | PRN
  Administered 2019-12-10: 1000 mL

## 2019-12-10 MED ORDER — ISOPROPYL ALCOHOL 70 % SOLN
Status: DC | PRN
  Administered 2019-12-10: 1 via TOPICAL

## 2019-12-10 MED ORDER — SODIUM CHLORIDE (PF) 0.9 % IJ SOLN
INTRAMUSCULAR | Status: AC
  Filled 2019-12-10: qty 50

## 2019-12-10 MED ORDER — OXYCODONE HCL 5 MG/5ML PO SOLN: 5.0000 mg | Freq: Once | ORAL | Status: AC | PRN

## 2019-12-10 MED ORDER — MIDAZOLAM HCL 2 MG/2ML IJ SOLN
INTRAMUSCULAR | Status: DC | PRN
  Administered 2019-12-10: 2 mg via INTRAVENOUS

## 2019-12-10 MED ORDER — CEFAZOLIN SODIUM-DEXTROSE 2-3 GM-%(50ML) IV SOLR
INTRAVENOUS | Status: DC | PRN
Start: 2019-12-10 — End: 2019-12-10
  Administered 2019-12-10: 2 g via INTRAVENOUS

## 2019-12-10 MED ORDER — SCOPOLAMINE 1 MG/3DAYS TD PT72
1.0000 | MEDICATED_PATCH | TRANSDERMAL | Status: DC
  Administered 2019-12-10: 1.5 mg via TRANSDERMAL
  Filled 2019-12-10: qty 1

## 2019-12-10 MED ORDER — VANCOMYCIN HCL 1500 MG/300ML IV SOLN
1500.0000 mg | INTRAVENOUS | Status: AC
  Administered 2019-12-10: 1500 mg via INTRAVENOUS
  Filled 2019-12-10: qty 300

## 2019-12-10 MED ORDER — MIDAZOLAM HCL 2 MG/2ML IJ SOLN
INTRAMUSCULAR | Status: AC
  Filled 2019-12-10: qty 2

## 2019-12-10 MED ORDER — ONDANSETRON HCL 4 MG/2ML IJ SOLN
INTRAMUSCULAR | Status: DC | PRN
  Administered 2019-12-10: 4 mg via INTRAVENOUS

## 2019-12-10 MED ORDER — TRANEXAMIC ACID-NACL 1000-0.7 MG/100ML-% IV SOLN
1000.0000 mg | INTRAVENOUS | Status: AC
  Administered 2019-12-10: 1000 mg via INTRAVENOUS
  Filled 2019-12-10: qty 100

## 2019-12-10 MED ORDER — ACETAMINOPHEN 10 MG/ML IV SOLN
INTRAVENOUS | Status: AC
  Filled 2019-12-10: qty 100

## 2019-12-10 MED ORDER — HYDROMORPHONE HCL 1 MG/ML IJ SOLN
INTRAMUSCULAR | Status: AC
  Filled 2019-12-10: qty 1

## 2019-12-10 MED ORDER — SCOPOLAMINE 1 MG/3DAYS TD PT72
MEDICATED_PATCH | TRANSDERMAL | Status: AC
  Filled 2019-12-10: qty 1

## 2019-12-10 MED ORDER — LIDOCAINE 2% (20 MG/ML) 5 ML SYRINGE
INTRAMUSCULAR | Status: DC | PRN
  Administered 2019-12-10: 100 mg via INTRAVENOUS

## 2019-12-10 MED ORDER — DEXTROSE 5 % IV SOLN
3.0000 g | INTRAVENOUS | Status: DC
  Filled 2019-12-10: qty 3000

## 2019-12-10 MED ORDER — LACTATED RINGERS IV SOLN: INTRAVENOUS | Status: DC

## 2019-12-10 MED ORDER — PROPOFOL 10 MG/ML IV BOLUS
INTRAVENOUS | Status: AC
  Filled 2019-12-10: qty 20

## 2019-12-10 MED ORDER — OXYCODONE HCL 5 MG PO TABS
ORAL_TABLET | ORAL | Status: AC
  Filled 2019-12-10: qty 1

## 2019-12-10 MED ORDER — POVIDONE-IODINE 10 % EX SWAB: 2.0000 "application " | Freq: Once | CUTANEOUS | Status: DC

## 2019-12-10 MED ORDER — DEXAMETHASONE SODIUM PHOSPHATE 10 MG/ML IJ SOLN
INTRAMUSCULAR | Status: DC | PRN
  Administered 2019-12-10: 10 mg via INTRAVENOUS

## 2019-12-10 MED ORDER — IRRISEPT - 450ML BOTTLE WITH 0.05% CHG IN STERILE WATER, USP 99.95% OPTIME
TOPICAL | Status: DC | PRN
  Administered 2019-12-10: 450 mL

## 2019-12-10 MED ORDER — CEFAZOLIN SODIUM-DEXTROSE 2-4 GM/100ML-% IV SOLN: 2.0000 g | Freq: Once | INTRAVENOUS | Status: DC

## 2019-12-10 MED ORDER — STERILE WATER FOR IRRIGATION IR SOLN
Status: DC | PRN
  Administered 2019-12-10: 1000 mL

## 2019-12-10 MED ORDER — KETOROLAC TROMETHAMINE 30 MG/ML IJ SOLN
INTRAMUSCULAR | Status: AC
  Filled 2019-12-10: qty 1

## 2019-12-10 MED ORDER — POVIDONE-IODINE 10 % EX SWAB
2.0000 "application " | Freq: Once | CUTANEOUS | Status: AC
  Administered 2019-12-10: 2 via TOPICAL

## 2019-12-10 MED ORDER — PHENYLEPHRINE 40 MCG/ML (10ML) SYRINGE FOR IV PUSH (FOR BLOOD PRESSURE SUPPORT)
PREFILLED_SYRINGE | INTRAVENOUS | Status: AC
  Filled 2019-12-10: qty 10

## 2019-12-10 MED ORDER — MEPERIDINE HCL 50 MG/ML IJ SOLN: 6.2500 mg | INTRAMUSCULAR | Status: DC | PRN

## 2019-12-10 MED ORDER — BUPIVACAINE HCL (PF) 0.25 % IJ SOLN
INTRAMUSCULAR | Status: AC
  Filled 2019-12-10: qty 30

## 2019-12-10 MED ORDER — GLYCOPYRROLATE PF 0.2 MG/ML IJ SOSY
PREFILLED_SYRINGE | INTRAMUSCULAR | Status: DC | PRN
  Administered 2019-12-10: .2 mg via INTRAVENOUS

## 2019-12-10 MED ORDER — SODIUM CHLORIDE 0.9 % IV SOLN: INTRAVENOUS | Status: DC

## 2019-12-10 MED ORDER — DOXYCYCLINE HYCLATE 100 MG PO CAPS: 100.0000 mg | ORAL_CAPSULE | Freq: Two times a day (BID) | ORAL | 0 refills | Status: DC

## 2019-12-10 MED ORDER — ACETAMINOPHEN 10 MG/ML IV SOLN
1000.0000 mg | Freq: Once | INTRAVENOUS | Status: DC | PRN
  Administered 2019-12-10: 1000 mg via INTRAVENOUS

## 2019-12-10 MED ORDER — PROPOFOL 500 MG/50ML IV EMUL
INTRAVENOUS | Status: DC | PRN
  Administered 2019-12-10: 180 mg via INTRAVENOUS

## 2019-12-10 MED ORDER — SODIUM CHLORIDE (PF) 0.9 % IJ SOLN: INTRAMUSCULAR | Status: DC | PRN

## 2019-12-10 MED ORDER — 0.9 % SODIUM CHLORIDE (POUR BTL) OPTIME
TOPICAL | Status: DC | PRN
  Administered 2019-12-10: 1000 mL

## 2019-12-10 MED ORDER — OXYCODONE HCL 5 MG PO TABS
5.0000 mg | ORAL_TABLET | Freq: Once | ORAL | Status: AC | PRN
  Administered 2019-12-10: 5 mg via ORAL

## 2019-12-10 MED ORDER — EPHEDRINE 5 MG/ML INJ
INTRAVENOUS | Status: AC
  Filled 2019-12-10: qty 10

## 2019-12-10 MED ORDER — EPHEDRINE 5 MG/ML INJ
INTRAVENOUS | Status: AC
  Filled 2019-12-10: qty 20

## 2019-12-10 MED ORDER — FENTANYL CITRATE (PF) 100 MCG/2ML IJ SOLN
INTRAMUSCULAR | Status: DC | PRN
  Administered 2019-12-10 (×2): 50 ug via INTRAVENOUS

## 2019-12-10 MED ORDER — LIDOCAINE 2% (20 MG/ML) 5 ML SYRINGE
INTRAMUSCULAR | Status: AC
  Filled 2019-12-10: qty 5

## 2019-12-10 MED ORDER — ONDANSETRON HCL 4 MG/2ML IJ SOLN: 4.0000 mg | Freq: Once | INTRAMUSCULAR | Status: DC | PRN

## 2019-12-10 MED ORDER — ONDANSETRON HCL 4 MG/2ML IJ SOLN
INTRAMUSCULAR | Status: AC
  Filled 2019-12-10: qty 2

## 2019-12-10 MED ORDER — ONDANSETRON HCL 4 MG/2ML IJ SOLN
INTRAMUSCULAR | Status: AC
  Filled 2019-12-10: qty 4

## 2019-12-10 MED ORDER — BUPIVACAINE HCL (PF) 0.25 % IJ SOLN: INTRAMUSCULAR | Status: DC | PRN

## 2019-12-10 MED ORDER — HYDROCODONE-ACETAMINOPHEN 5-325 MG PO TABS: 1.0000 | ORAL_TABLET | ORAL | 0 refills | Status: DC | PRN

## 2019-12-10 MED ORDER — FENTANYL CITRATE (PF) 100 MCG/2ML IJ SOLN
INTRAMUSCULAR | Status: AC
  Filled 2019-12-10: qty 2

## 2019-12-10 MED ORDER — GLYCOPYRROLATE PF 0.2 MG/ML IJ SOSY
PREFILLED_SYRINGE | INTRAMUSCULAR | Status: AC
  Filled 2019-12-10: qty 2

## 2019-12-10 MED ORDER — KETOROLAC TROMETHAMINE 30 MG/ML IJ SOLN: INTRAMUSCULAR | Status: DC | PRN

## 2019-12-10 MED ORDER — DEXAMETHASONE SODIUM PHOSPHATE 10 MG/ML IJ SOLN
INTRAMUSCULAR | Status: AC
  Filled 2019-12-10: qty 1

## 2019-12-10 SURGICAL SUPPLY — 63 items
APL PRP STRL LF DISP 70% ISPRP (MISCELLANEOUS)
BAG SPEC THK2 15X12 ZIP CLS (MISCELLANEOUS) ×1
BAG ZIPLOCK 12X15 (MISCELLANEOUS) ×2 IMPLANT
CHLORAPREP W/TINT 26 (MISCELLANEOUS) IMPLANT
COVER SURGICAL LIGHT HANDLE (MISCELLANEOUS) ×2 IMPLANT
DRAPE IMP U-DRAPE 54X76 (DRAPES) ×2 IMPLANT
DRAPE ORTHO SPLIT 77X108 STRL (DRAPES)
DRAPE SHEET LG 3/4 BI-LAMINATE (DRAPES) ×6 IMPLANT
DRAPE STERI IOBAN 125X83 (DRAPES) ×2 IMPLANT
DRAPE SURG ORHT 6 SPLT 77X108 (DRAPES) IMPLANT
DRAPE U-SHAPE 47X51 STRL (DRAPES) ×4 IMPLANT
DRESSING PEEL AND PLAC PRVNA20 (GAUZE/BANDAGES/DRESSINGS) IMPLANT
DRESSING PEEL AND PLC PRVNA 13 (GAUZE/BANDAGES/DRESSINGS) IMPLANT
DRESSING PREVENA PLUS CUSTOM (GAUZE/BANDAGES/DRESSINGS) IMPLANT
DRSG AQUACEL AG ADV 3.5X10 (GAUZE/BANDAGES/DRESSINGS) IMPLANT
DRSG PEEL AND PLACE PREVENA 13 (GAUZE/BANDAGES/DRESSINGS)
DRSG PEEL AND PLACE PREVENA 20 (GAUZE/BANDAGES/DRESSINGS)
DRSG PREVENA PLUS CUSTOM (GAUZE/BANDAGES/DRESSINGS)
DRSG TEGADERM 4X4.75 (GAUZE/BANDAGES/DRESSINGS) ×2 IMPLANT
ELECT BLADE TIP CTD 4 INCH (ELECTRODE) ×2 IMPLANT
ELECT REM PT RETURN 15FT ADLT (MISCELLANEOUS) ×2 IMPLANT
EVACUATOR DRAINAGE 10X20 100CC (DRAIN) IMPLANT
EVACUATOR SILICONE 100CC (DRAIN)
GAUZE SPONGE 4X4 12PLY STRL (GAUZE/BANDAGES/DRESSINGS) ×1 IMPLANT
GAUZE XEROFORM 1X8 LF (GAUZE/BANDAGES/DRESSINGS) ×1 IMPLANT
GLOVE BIO SURGEON STRL SZ8.5 (GLOVE) ×6 IMPLANT
GLOVE BIOGEL PI IND STRL 8.5 (GLOVE) ×1 IMPLANT
GLOVE BIOGEL PI INDICATOR 8.5 (GLOVE) ×1
GOWN SPEC L3 XXLG W/TWL (GOWN DISPOSABLE) ×2 IMPLANT
HANDPIECE INTERPULSE COAX TIP (DISPOSABLE) ×2
HOOD PEEL AWAY FLYTE STAYCOOL (MISCELLANEOUS) ×6 IMPLANT
JET LAVAGE IRRISEPT WOUND (IRRIGATION / IRRIGATOR)
KIT DRSG PREVENA PLUS 7DAY 125 (MISCELLANEOUS) ×1 IMPLANT
KIT TURNOVER KIT A (KITS) IMPLANT
LAVAGE JET IRRISEPT WOUND (IRRIGATION / IRRIGATOR) IMPLANT
MANIFOLD NEPTUNE II (INSTRUMENTS) ×2 IMPLANT
MARKER SKIN DUAL TIP RULER LAB (MISCELLANEOUS) ×2 IMPLANT
NDL SAFETY ECLIPSE 18X1.5 (NEEDLE) IMPLANT
NDL SPNL 18GX3.5 QUINCKE PK (NEEDLE) IMPLANT
NEEDLE HYPO 18GX1.5 SHARP (NEEDLE)
NEEDLE SPNL 18GX3.5 QUINCKE PK (NEEDLE) IMPLANT
NS IRRIG 1000ML POUR BTL (IV SOLUTION) ×2 IMPLANT
PACK TOTAL JOINT (CUSTOM PROCEDURE TRAY) IMPLANT
PENCIL SMOKE EVACUATOR (MISCELLANEOUS) IMPLANT
PREVENA RESTOR AXIOFORM 29X28 (GAUZE/BANDAGES/DRESSINGS) ×1 IMPLANT
SET HNDPC FAN SPRY TIP SCT (DISPOSABLE) ×1 IMPLANT
SPONGE DRAIN TRACH 4X4 STRL 2S (GAUZE/BANDAGES/DRESSINGS) ×1 IMPLANT
STAPLER VISISTAT 35W (STAPLE) IMPLANT
SUT ETHILON 2 0 PSLX (SUTURE) ×4 IMPLANT
SUT MNCRL AB 3-0 PS2 18 (SUTURE) IMPLANT
SUT MNCRL AB 4-0 PS2 18 (SUTURE) IMPLANT
SUT MON AB 2-0 CT1 36 (SUTURE) ×2 IMPLANT
SUT STRATAFIX PDO 1 14 VIOLET (SUTURE)
SUT STRATFX PDO 1 14 VIOLET (SUTURE)
SUT VIC AB 2-0 CT1 27 (SUTURE)
SUT VIC AB 2-0 CT1 TAPERPNT 27 (SUTURE) IMPLANT
SUTURE STRATFX PDO 1 14 VIOLET (SUTURE) IMPLANT
SWAB COLLECTION DEVICE MRSA (MISCELLANEOUS) ×2 IMPLANT
SWAB CULTURE ESWAB REG 1ML (MISCELLANEOUS) ×2 IMPLANT
SYR 3ML LL SCALE MARK (SYRINGE) IMPLANT
TRAY FOLEY MTR SLVR 16FR STAT (SET/KITS/TRAYS/PACK) IMPLANT
TRAY PREP A LATEX SAFE STRL (SET/KITS/TRAYS/PACK) ×2 IMPLANT
YANKAUER SUCT BULB TIP 10FT TU (MISCELLANEOUS) ×2 IMPLANT

## 2019-12-10 NOTE — Anesthesia Postprocedure Evaluation (Signed)
Anesthesia Post Note  Patient: Christina Ray  Procedure(s) Performed: IRRIGATION AND DEBRIDEMENT HIP (Right Hip)     Patient location during evaluation: PACU Anesthesia Type: General Level of consciousness: awake and alert Pain management: pain level controlled Vital Signs Assessment: post-procedure vital signs reviewed and stable Respiratory status: spontaneous breathing, nonlabored ventilation, respiratory function stable and patient connected to nasal cannula oxygen Cardiovascular status: blood pressure returned to baseline and stable Postop Assessment: no apparent nausea or vomiting Anesthetic complications: no    Last Vitals:  Vitals:   12/10/19 1430 12/10/19 1445  BP: (!) 141/68 (!) 134/56  Pulse: (!) 41 (!) 41  Resp: 12 12  Temp:    SpO2: 100% 100%    Last Pain:  Vitals:   12/10/19 1445  TempSrc:   PainSc: 2                  Trevor Iha

## 2019-12-10 NOTE — Op Note (Signed)
OPERATIVE REPORT   12/10/2019  12:52 PM  PATIENT:  Christina Ray   SURGEON:  Jonette Pesa, MD  ASSISTANT:  Skip Mayer, PA-C.   PREOPERATIVE DIAGNOSIS: Delayed wound healing right hip/wound dehiscence.  POSTOPERATIVE DIAGNOSIS:  Same.  PROCEDURE: Excisional debridement of skin and subcutaneous tissue right hip. Closure of right hip wound. Application of negative pressure incisional dressing.  ANESTHESIA:   GETA.  ANTIBIOTICS: 1.5 g vancomycin.  IMPLANTS: None.  SPECIMENS: Right hip superficial wound culture.  COMPLICATIONS: None.  DISPOSITION: Stable to PACU.  SURGICAL INDICATIONS:  Christina Ray is a 64 y.o. female who underwent primary right total hip arthroplasty about 6 weeks ago.  She had an area of delayed wound healing and experienced recent breakdown.  She was indicated for debridement and closure.  We discussed the risk, benefits, and alternatives.  PROCEDURE IN DETAIL: Patient was identified in holding area using 2 identifiers.  The surgical site was marked by myself.  She was taken the operating room and general anesthesia was induced.  She was then transferred to [facility] table.  The right hip was prepped and draped in the normal sterile surgical fashion.  Timeout was called, verifying site and site of surgery.  She did receive IV antibiotics within 60 minutes of beginning the procedure.  I began by examining the right hip.  At the distal aspect of the incision she had an area of dehiscence of the skin with underlying necrotic fat.  A couple centimeters proximal to this area there was an area of an eschar.  At the most superior aspect of the wound in the pannicular fold, there was an additional area of delayed healing.  On the lateral skin edge a few centimeters away from the actual incision, there was some skin irritation and breakdown from the use of adhesive tape.  Using a #10 blade, I sharply ellipsed out all of these areas.  The deep tissue was  healed well.  There were no fluid collections.  No purulence.  I did swab the remaining subcutaneous tissue, and this was sent for Gram stain and culture.  I raised a small subcutaneous flaps in order to facilitate the closure.  The wound was then irrigated with irrisept solution followed by 3 L of normal saline using pulse lavage.  Through a separate stab incision in line distally, I placed a 10 mm flat JP drain.  The drain was sewn in distally with 3-0 nylon.  Wound closure then commenced with 2-0 Monocryl for the deep dermal layer, 2-0 nylon vertical mattress suture for the skin.  Xeroform was placed over the superficial irritation on the lateral edge.  Prevena incisional dressing was placed according to manufacturer's instructions and suction was hooked up using the portable unit.  There was no leak.  The patient was then awakened from anesthesia, taken to the PACU in stable condition.  Sponge, needle, and instrument counts were correct at the end of the case x2.  There were no known complications.  POSTOPERATIVE PLAN: Postoperatively, patient will be discharged to home from PACU.  Weight-bear as tolerated right lower extremity.  She will be discharged home with incisional VAC dressing and a JP drain.  Continue oral cephalexin.  Oral doxycycline also prescribed.  Return to the office next Tuesday for removal of the incisional VAC dressing and possibly the JP drain as well.

## 2019-12-10 NOTE — H&P (Signed)
PREOPERATIVE H&P  Chief Complaint: Right hip wound dehiscence  HPI: Christina Ray is a 64 y.o. female who presents for preoperative history and physical with a diagnosis of Right hip wound dehiscence.   She has elected for surgical management.   Past Medical History:  Diagnosis Date  . Allergy   . Anxiety   . Arthritis   . Asthma   . Chronic back pain   . Complication of anesthesia   . Diverticulitis   . GERD (gastroesophageal reflux disease)   . Hyperlipidemia   . Hypertension   . PONV (postoperative nausea and vomiting)   . Vertigo    Past Surgical History:  Procedure Laterality Date  . ABDOMINAL HYSTERECTOMY    . Arthroscopic knee surgery Right    X3  . HAND SURGERY Right   . TOTAL HIP ARTHROPLASTY Right 10/14/2019   Procedure: TOTAL HIP ARTHROPLASTY ANTERIOR APPROACH;  Surgeon: Samson Frederic, MD;  Location: [facility];  Service: Orthopedics;  Laterality: Right;   Social History   Socioeconomic History  . Marital status: Single    Spouse name: Not on file  . Number of children: Not on file  . Years of education: Not on file  . Highest education level: Not on file  Occupational History  . Occupation: retired  Tobacco Use  . Smoking status: Former Smoker    Types: Cigarettes    Quit date: 1989    Years since quitting: 32.4  . Smokeless tobacco: Never Used  Substance and Sexual Activity  . Alcohol use: No  . Drug use: No  . Sexual activity: Yes    Birth control/protection: Surgical  Other Topics Concern  . 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  Is disabled.  She tries exercise.      3 minutes a day for 12 to 20 minutes doing stationary bike.   Social Determinants of Health   Financial Resource Strain:   . Difficulty of Paying Living Expenses:   Food Insecurity:   . Worried About Programme researcher, broadcasting/film/video in the Last Year:   . Barista in the Last  Year:   Transportation Needs:   . Freight forwarder (Medical):   Marland Kitchen Lack of Transportation (Non-Medical):   Physical Activity:   . Days of Exercise per Week:   . Minutes of Exercise per Session:   Stress:   . Feeling of Stress :   Social Connections:   . Frequency of Communication with Friends and Family:   . Frequency of Social Gatherings with Friends and Family:   . Attends Religious Services:   . Active Member of Clubs or Organizations:   . Attends Banker Meetings:   Marland Kitchen Marital Status:    Family History  Problem Relation Age of Onset  . Hypertension Other   . Cancer Other   . Congestive Heart Failure Mother 22  . Cardiomyopathy Father        He has an ICD, but no comment of MI.  (Is in his 22s   No Known Allergies Prior to Admission medications   Medication Sig Start Date End Date Taking? Authorizing Provider  atenolol (TENORMIN) 50 MG tablet TAKE 1 TABLET BY MOUTH EVERY DAY Patient taking differently: Take 50 mg by mouth daily.  04/04/18  Yes Riccio, Angela C, DO  atorvastatin (LIPITOR) 20 MG tablet Take  20 mg by mouth daily. 09/03/19  Yes [provider]  Calcium-Magnesium-Zinc (CAL-MAG-ZINC PO) Take 1 tablet by mouth 2 (two) times a week.   Yes [provider]  cefadroxil (DURICEF) 500 MG capsule Take 1,000 mg by mouth daily.   Yes [provider]  celecoxib (CELEBREX) 200 MG capsule Take 1 capsule (200 mg total) by mouth daily. 10/15/19  Yes Nayab Aten, Aaron Edelman, MD  Cyanocobalamin (VITAMIN B-12) 2500 MCG SUBL Take 2,500 mcg by mouth 2 (two) times a week.   Yes [provider]  docusate sodium (COLACE) 100 MG capsule Take 1 capsule (100 mg total) by mouth 2 (two) times daily. Patient taking differently: Take 100 mg by mouth daily as needed for moderate constipation.  10/15/19  Yes Tom Ragsdale, Aaron Edelman, MD  fluticasone (FLONASE) 50 MCG/ACT nasal spray Place 2 sprays into both nostrils daily. Patient taking differently: Place 2 sprays  into both nostrils daily as needed (allergies.).  10/24/17  Yes Riccio, Angela C, DO  hydrochlorothiazide (HYDRODIURIL) 50 MG tablet Take 50 mg by mouth daily. 09/03/19  Yes [provider]  HYDROcodone-acetaminophen (NORCO/VICODIN) 5-325 MG tablet Take 1 tablet by mouth every 4 (four) hours as needed for moderate pain (pain score 4-6). 10/15/19  Yes Liam Bossman, Aaron Edelman, MD  lisinopril (ZESTRIL) 40 MG tablet Take 40 mg by mouth daily. 08/17/19  Yes [provider]  loratadine (CLARITIN) 10 MG tablet Take 10 mg by mouth daily.   Yes [provider]  mineral oil light external liquid Apply 1 application topically 2 (two) times daily as needed (inner ear itching.).   Yes [provider]  montelukast (SINGULAIR) 10 MG tablet TAKE 1 TABLET BY MOUTH EVERYDAY AT BEDTIME Patient taking differently: Take 10 mg by mouth daily.  09/04/18  Yes Steve Rattler, DO  Multiple Vitamin (MULTIVITAMIN WITH MINERALS) TABS tablet Take 1 tablet by mouth daily.   Yes [provider]  pantoprazole (PROTONIX) 40 MG tablet Take 40 mg by mouth daily. 02/27/19  Yes [provider]  polyethylene glycol-electrolytes (NULYTELY/GOLYTELY) 420 g solution Take 4,000 mLs by mouth as directed. 04/15/19  Yes Milus Banister, MD  SIMPLY SALINE NA Place 1 spray into the nose 4 (four) times daily as needed (congestion.).   Yes [provider]  Turmeric 500 MG TABS Take 500 mg by mouth 2 (two) times a week.   Yes [provider]  Vitamin D, Ergocalciferol, (DRISDOL) 1.25 MG (50000 UNIT) CAPS capsule Take 50,000 Units by mouth every Monday. 09/29/19  Yes [provider]  ondansetron (ZOFRAN) 4 MG tablet Take 1 tablet (4 mg total) by mouth every 6 (six) hours as needed for nausea. Patient not taking: Reported on 12/07/2019 10/15/19   Rod Can, MD  senna (SENOKOT) 8.6 MG TABS tablet Take 2 tablets (17.2 mg total) by mouth at bedtime. Patient not taking: Reported on  12/07/2019 10/15/19   Rod Can, MD     Positive ROS: All other systems have been reviewed and were otherwise negative with the exception of those mentioned in the HPI and as above.  Physical Exam: General: Alert, no acute distress Cardiovascular: No pedal edema Respiratory: No cyanosis, no use of accessory musculature GI: No organomegaly, abdomen is soft and non-tender Skin: No lesions in the area of chief complaint Neurologic: Sensation intact distally Psychiatric: Patient is competent for consent with normal mood and affect Lymphatic: No axillary or cervical lymphadenopathy  MUSCULOSKELETAL: Examination of the right hip reveals that at the distal aspect of the  incision, she has dehiscence of skin with underlying necrotic fatty tissue.  No fluid collections.  She has skin irritation on the lateral aspect of the hip from using tape on her dressings.  Painless logrolling of hip.  Assessment: Right hip wound dehiscence  Plan: Plan for Procedure(s): IRRIGATION AND DEBRIDEMENT HIP, possible head ball and liner exchange  The risks benefits and alternatives were discussed with the patient including but not limited to the risks of nonoperative treatment, versus surgical intervention including infection, bleeding, nerve injury,  blood clots, cardiopulmonary complications, morbidity, mortality, among others, and they were willing to proceed.   Jonette Pesa, MD [phone]   12/10/2019 9:53 AM

## 2019-12-10 NOTE — Transfer of Care (Signed)
Immediate Anesthesia Transfer of Care Note  Patient: Christina Ray  Procedure(s) Performed: Procedure(s): IRRIGATION AND DEBRIDEMENT HIP (Right)  Patient Location: PACU  Anesthesia Type:General  Level of Consciousness: Alert, Awake, Oriented  Airway & Oxygen Therapy: Patient Spontanous Breathing  Post-op Assessment: Report given to RN  Post vital signs: Reviewed and stable  Last Vitals:  Vitals:   12/10/19 0808  BP: 136/77  Pulse: (!) 55  Resp: 18  Temp: 36.7 C  SpO2: 100%    Complications: No apparent anesthesia complications
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
## 2019-12-10 NOTE — Anesthesia Procedure Notes (Signed)
Procedure Name: LMA Insertion Date/Time: 12/10/2019 11:23 AM Performed by: Basilio Cairo, CRNA Pre-anesthesia Checklist: Patient identified, Patient being monitored, Timeout performed, Emergency Drugs available and Suction available Patient Re-evaluated:Patient Re-evaluated prior to induction Oxygen Delivery Method: Circle system utilized Preoxygenation: Pre-oxygenation with 100% oxygen Induction Type: IV induction Ventilation: Mask ventilation without difficulty LMA: LMA inserted and LMA with gastric port inserted LMA Size: 4.0 Tube type: Oral Number of attempts: 1 Placement Confirmation: positive ETCO2 and breath sounds checked- equal and bilateral Tube secured with: Tape Dental Injury: Teeth and Oropharynx as per pre-operative assessment

## 2019-12-15 LAB — AEROBIC/ANAEROBIC CULTURE W GRAM STAIN (SURGICAL/DEEP WOUND)

## 2020-03-09 DIAGNOSIS — Z96649 Presence of unspecified artificial hip joint: Secondary | ICD-10-CM | POA: Insufficient documentation

## 2020-04-19 ENCOUNTER — Other Ambulatory Visit: Payer: Self-pay | Admitting: Family

## 2020-04-19 DIAGNOSIS — Z1231 Encounter for screening mammogram for malignant neoplasm of breast: Secondary | ICD-10-CM

## 2020-04-19 DIAGNOSIS — Z13820 Encounter for screening for osteoporosis: Secondary | ICD-10-CM

## 2020-08-28 DIAGNOSIS — M1990 Unspecified osteoarthritis, unspecified site: Secondary | ICD-10-CM | POA: Insufficient documentation

## 2020-08-28 DIAGNOSIS — K5790 Diverticulosis of intestine, part unspecified, without perforation or abscess without bleeding: Secondary | ICD-10-CM | POA: Insufficient documentation

## 2020-08-28 DIAGNOSIS — E785 Hyperlipidemia, unspecified: Secondary | ICD-10-CM | POA: Insufficient documentation

## 2020-08-28 DIAGNOSIS — J45909 Unspecified asthma, uncomplicated: Secondary | ICD-10-CM | POA: Insufficient documentation

## 2020-08-31 DIAGNOSIS — N1831 Chronic kidney disease, stage 3a: Secondary | ICD-10-CM | POA: Insufficient documentation

## 2020-09-01 ENCOUNTER — Other Ambulatory Visit: Payer: Self-pay | Admitting: Family

## 2020-09-01 DIAGNOSIS — R109 Unspecified abdominal pain: Secondary | ICD-10-CM

## 2020-09-03 ENCOUNTER — Encounter (HOSPITAL_COMMUNITY): Payer: Self-pay | Admitting: Emergency Medicine

## 2020-09-03 ENCOUNTER — Emergency Department (HOSPITAL_COMMUNITY)
Admission: EM | Admit: 2020-09-03 | Discharge: 2020-09-03 | Disposition: A | Discharge status: Home / Self Care | Payer: Medicare HMO | Attending: Emergency Medicine | Admitting: Emergency Medicine

## 2020-09-03 ENCOUNTER — Emergency Department (HOSPITAL_COMMUNITY): Payer: Medicare HMO

## 2020-09-03 ENCOUNTER — Other Ambulatory Visit: Payer: Self-pay

## 2020-09-03 DIAGNOSIS — I129 Hypertensive chronic kidney disease with stage 1 through stage 4 chronic kidney disease, or unspecified chronic kidney disease: Secondary | ICD-10-CM | POA: Diagnosis not present

## 2020-09-03 DIAGNOSIS — M542 Cervicalgia: Secondary | ICD-10-CM | POA: Diagnosis present

## 2020-09-03 DIAGNOSIS — Y92410 Unspecified street and highway as the place of occurrence of the external cause: Secondary | ICD-10-CM | POA: Diagnosis not present

## 2020-09-03 DIAGNOSIS — M62838 Other muscle spasm: Secondary | ICD-10-CM | POA: Diagnosis not present

## 2020-09-03 DIAGNOSIS — M25551 Pain in right hip: Secondary | ICD-10-CM | POA: Insufficient documentation

## 2020-09-03 DIAGNOSIS — J45909 Unspecified asthma, uncomplicated: Secondary | ICD-10-CM | POA: Diagnosis not present

## 2020-09-03 DIAGNOSIS — Z87891 Personal history of nicotine dependence: Secondary | ICD-10-CM | POA: Diagnosis not present

## 2020-09-03 DIAGNOSIS — Z96641 Presence of right artificial hip joint: Secondary | ICD-10-CM | POA: Diagnosis not present

## 2020-09-03 DIAGNOSIS — Z79899 Other long term (current) drug therapy: Secondary | ICD-10-CM | POA: Insufficient documentation

## 2020-09-03 DIAGNOSIS — N189 Chronic kidney disease, unspecified: Secondary | ICD-10-CM | POA: Insufficient documentation

## 2020-09-03 DIAGNOSIS — M25531 Pain in right wrist: Secondary | ICD-10-CM | POA: Insufficient documentation

## 2020-09-03 MED ORDER — ACETAMINOPHEN 325 MG PO TABS
650.0000 mg | ORAL_TABLET | Freq: Once | ORAL | Status: AC
  Administered 2020-09-03: 650 mg via ORAL
  Filled 2020-09-03: qty 2

## 2020-09-03 MED ORDER — LIDOCAINE 5 % EX PTCH
1.0000 | MEDICATED_PATCH | CUTANEOUS | Status: DC
  Administered 2020-09-03: 1 via TRANSDERMAL
  Filled 2020-09-03: qty 1
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
## 2020-09-03 NOTE — ED Notes (Signed)
Pt discharged from this ED in stable condition at this time. All discharge instructions and follow up care reviewed with pt with no further questions at this time. Pt ambulatory with steady gait, clear speech.  
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
## 2020-09-07 ENCOUNTER — Ambulatory Visit
Admission: RE | Admit: 2020-09-07 | Discharge: 2020-09-07 | Disposition: A | Discharge status: Home / Self Care | Payer: Medicare HMO | Source: Ambulatory Visit | Attending: Family | Admitting: Family

## 2020-09-07 ENCOUNTER — Other Ambulatory Visit: Payer: Self-pay | Admitting: Family

## 2020-09-07 DIAGNOSIS — R109 Unspecified abdominal pain: Secondary | ICD-10-CM

## 2020-09-07 DIAGNOSIS — R519 Headache, unspecified: Secondary | ICD-10-CM

## 2020-09-14 DIAGNOSIS — M48061 Spinal stenosis, lumbar region without neurogenic claudication: Secondary | ICD-10-CM | POA: Insufficient documentation

## 2020-09-14 DIAGNOSIS — I700 Atherosclerosis of aorta: Secondary | ICD-10-CM | POA: Insufficient documentation

## 2020-09-17 ENCOUNTER — Other Ambulatory Visit: Payer: Medicare HMO

## 2020-09-26 ENCOUNTER — Encounter (INDEPENDENT_AMBULATORY_CARE_PROVIDER_SITE_OTHER): Payer: Self-pay

## 2020-09-28 ENCOUNTER — Encounter (INDEPENDENT_AMBULATORY_CARE_PROVIDER_SITE_OTHER): Payer: Self-pay

## 2020-10-04 ENCOUNTER — Ambulatory Visit (INDEPENDENT_AMBULATORY_CARE_PROVIDER_SITE_OTHER): Payer: Self-pay | Admitting: Bariatrics

## 2020-10-11 ENCOUNTER — Ambulatory Visit (INDEPENDENT_AMBULATORY_CARE_PROVIDER_SITE_OTHER): Payer: Self-pay | Admitting: Family Medicine

## 2020-10-18 ENCOUNTER — Ambulatory Visit (INDEPENDENT_AMBULATORY_CARE_PROVIDER_SITE_OTHER): Payer: Self-pay | Admitting: Bariatrics

## 2020-10-25 ENCOUNTER — Ambulatory Visit (INDEPENDENT_AMBULATORY_CARE_PROVIDER_SITE_OTHER): Payer: Self-pay | Admitting: Family Medicine

## 2020-11-18 ENCOUNTER — Other Ambulatory Visit: Payer: Self-pay

## 2020-11-18 ENCOUNTER — Emergency Department (HOSPITAL_COMMUNITY): Payer: Medicare HMO

## 2020-11-18 ENCOUNTER — Encounter (HOSPITAL_COMMUNITY): Payer: Self-pay

## 2020-11-18 ENCOUNTER — Emergency Department (HOSPITAL_COMMUNITY)
Admission: EM | Admit: 2020-11-18 | Discharge: 2020-11-18 | Disposition: A | Discharge status: Home / Self Care | Payer: Medicare HMO | Attending: Emergency Medicine | Admitting: Emergency Medicine

## 2020-11-18 DIAGNOSIS — M25562 Pain in left knee: Secondary | ICD-10-CM | POA: Diagnosis not present

## 2020-11-18 DIAGNOSIS — Z79899 Other long term (current) drug therapy: Secondary | ICD-10-CM | POA: Insufficient documentation

## 2020-11-18 DIAGNOSIS — Z87891 Personal history of nicotine dependence: Secondary | ICD-10-CM | POA: Diagnosis not present

## 2020-11-18 DIAGNOSIS — I10 Essential (primary) hypertension: Secondary | ICD-10-CM | POA: Insufficient documentation

## 2020-11-18 DIAGNOSIS — J45909 Unspecified asthma, uncomplicated: Secondary | ICD-10-CM | POA: Diagnosis not present

## 2020-11-18 DIAGNOSIS — Z96641 Presence of right artificial hip joint: Secondary | ICD-10-CM | POA: Diagnosis not present

## 2020-11-18 DIAGNOSIS — M25552 Pain in left hip: Secondary | ICD-10-CM | POA: Insufficient documentation

## 2020-11-18 MED ORDER — TIZANIDINE HCL 2 MG PO TABS: 2.0000 mg | ORAL_TABLET | Freq: Three times a day (TID) | ORAL | 0 refills | Status: DC | PRN

## 2020-11-18 MED ORDER — PREDNISONE 20 MG PO TABS: 40.0000 mg | ORAL_TABLET | Freq: Every day | ORAL | 0 refills | Status: AC

## 2020-11-18 NOTE — ED Provider Notes (Signed)
[facility] Provider Note   CSN: [id number] Arrival date & time: 11/18/20  1218     History Chief Complaint  Patient presents with  . Leg Pain    Christina Ray is a 65 y.o. female.  HPI   Patient with significant medical history of chronic back pain, GERD, hypertension presents to the emergency department with chief complaint of left hip and leg pain.  Patient states this started 2 days ago, states it happened after she was try to get out of the shower, she fell backwards into the tub.  She landed on her left hip and has pain ever since.  She does admit that she hit her head, but she denies losing conscious, is not on anticoagulant, she states she had a slight headache then but this is since resolved, denies change in vision, paresthesias or weakness in the upper or lower extremities.  She describes the pain as a sharp sensation which radiates down her leg, has occasional paresthesias, denies urinary symptoms like retention, incontinence, difficult bowel movements.  Patient states she is able to ambulate but has pain when she does so, pain goes away when she has rest.  Patient has been trying over-the-counter pain medication with narcotics without any significant relief.  Patient denies headaches, fevers, chills, shortness of breath, chest pain, abdominal pain, nausea, vomiting, diarrhea, worsening pedal edema.  Past Medical History:  Diagnosis Date  . Allergy   . Anxiety   . Arthritis   . Asthma   . Chronic back pain   . Complication of anesthesia   . Diverticulitis   . GERD (gastroesophageal reflux disease)   . Hyperlipidemia   . Hypertension   . PONV (postoperative nausea and vomiting)   . Vertigo     Patient Active Problem List   Diagnosis Date Noted  . Osteoarthritis of right knee 10/14/2019  . Osteoarthritis of right hip 10/14/2019  . Acute viral syndrome 07/31/2018  . Otitis media 07/30/2018  . Nasal congestion 11/13/2017  .  Shortness of breath 11/12/2017  . Chest pain with moderate risk for cardiac etiology 11/12/2017  . Other abnormal glucose 06/24/2017  . Palpitations 06/24/2017  . Healthcare maintenance 06/24/2017  . Essential hypertension 04/29/2017  . Anxiety state 04/29/2017  . Seasonal allergies 04/29/2017  . GERD (gastroesophageal reflux disease) 04/29/2017    Past Surgical History:  Procedure Laterality Date  . ABDOMINAL HYSTERECTOMY    . Arthroscopic knee surgery Right    X3  . HAND SURGERY Right   . INCISION AND DRAINAGE HIP Right 12/10/2019   Procedure: IRRIGATION AND DEBRIDEMENT HIP;  Surgeon: Samson Frederic, MD;  Location: [facility];  Service: Orthopedics;  Laterality: Right;  . TOTAL HIP ARTHROPLASTY Right 10/14/2019   Procedure: TOTAL HIP ARTHROPLASTY ANTERIOR APPROACH;  Surgeon: Samson Frederic, MD;  Location: [facility];  Service: Orthopedics;  Laterality: Right;     OB History   No obstetric history on file.     Family History  Problem Relation Age of Onset  . Hypertension Other   . Cancer Other   . Congestive Heart Failure Mother 73  . Cardiomyopathy Father        He has an ICD, but no comment of MI.  (Is in his 3s    Social History   Tobacco Use  . Smoking status: Former Smoker    Types: Cigarettes    Quit date: 1989    Years since quitting: 33.3  . Smokeless tobacco: Never Used  Vaping  Use  . Vaping Use: Never used  Substance Use Topics  . Alcohol use: No  . Drug use: No    Home Medications Prior to Admission medications   Medication Sig Start Date End Date Taking? Authorizing Provider  predniSONE (DELTASONE) 20 MG tablet Take 2 tablets (40 mg total) by mouth daily for 5 days. 11/18/20 11/23/20 Yes Carroll Sage, PA-C  atenolol (TENORMIN) 50 MG tablet TAKE 1 TABLET BY MOUTH EVERY DAY Patient taking differently: Take 50 mg by mouth daily.  04/04/18   Tillman Sers, DO  atorvastatin (LIPITOR) 20 MG tablet Take 20 mg by mouth daily. 09/03/19   [provider]  Calcium-Magnesium-Zinc (CAL-MAG-ZINC PO) Take 1 tablet by mouth 2 (two) times a week.    [provider]  cefadroxil (DURICEF) 500 MG capsule Take 1 capsule (500 mg total) by mouth 2 (two) times daily. 12/10/19   Swinteck, Arlys John, MD  celecoxib (CELEBREX) 200 MG capsule Take 1 capsule (200 mg total) by mouth daily. 10/15/19   Swinteck, Arlys John, MD  Cyanocobalamin (VITAMIN B-12) 2500 MCG SUBL Take 2,500 mcg by mouth 2 (two) times a week.    [provider]  docusate sodium (COLACE) 100 MG capsule Take 1 capsule (100 mg total) by mouth 2 (two) times daily. Patient taking differently: Take 100 mg by mouth daily as needed for moderate constipation.  10/15/19   Swinteck, Arlys John, MD  doxycycline (VIBRAMYCIN) 100 MG capsule Take 1 capsule (100 mg total) by mouth 2 (two) times daily. 12/10/19   Swinteck, Arlys John, MD  fluticasone (FLONASE) 50 MCG/ACT nasal spray Place 2 sprays into both nostrils daily. Patient taking differently: Place 2 sprays into both nostrils daily as needed (allergies.).  10/24/17   Tillman Sers, DO  hydrochlorothiazide (HYDRODIURIL) 50 MG tablet Take 50 mg by mouth daily. 09/03/19   [provider]  HYDROcodone-acetaminophen (NORCO/VICODIN) 5-325 MG tablet Take 1 tablet by mouth every 4 (four) hours as needed for moderate pain (pain score 4-6). 12/10/19   Swinteck, Arlys John, MD  lisinopril (ZESTRIL) 40 MG tablet Take 40 mg by mouth daily. 08/17/19   [provider]  loratadine (CLARITIN) 10 MG tablet Take 10 mg by mouth daily.    [provider]  mineral oil light external liquid Apply 1 application topically 2 (two) times daily as needed (inner ear itching.).    [provider]  montelukast (SINGULAIR) 10 MG tablet TAKE 1 TABLET BY MOUTH EVERYDAY AT BEDTIME Patient taking differently: Take 10 mg by mouth daily.  09/04/18   Tillman Sers, DO  Multiple Vitamin (MULTIVITAMIN WITH MINERALS) TABS tablet Take 1 tablet by mouth daily.     [provider]  ondansetron (ZOFRAN) 4 MG tablet Take 1 tablet (4 mg total) by mouth every 6 (six) hours as needed for nausea. Patient not taking: Reported on 12/07/2019 10/15/19   Samson Frederic, MD  pantoprazole (PROTONIX) 40 MG tablet Take 40 mg by mouth daily. 02/27/19   [provider]  polyethylene glycol-electrolytes (NULYTELY/GOLYTELY) 420 g solution Take 4,000 mLs by mouth as directed. 04/15/19   Rachael Fee, MD  senna (SENOKOT) 8.6 MG TABS tablet Take 2 tablets (17.2 mg total) by mouth at bedtime. Patient not taking: Reported on 12/07/2019 10/15/19   Samson Frederic, MD  SIMPLY SALINE NA Place 1 spray into the nose 4 (four) times daily as needed (congestion.).    [provider]  Turmeric 500 MG TABS Take 500 mg by mouth 2 (two) times  a week.    [provider]  Vitamin D, Ergocalciferol, (DRISDOL) 1.25 MG (50000 UNIT) CAPS capsule Take 50,000 Units by mouth every Monday. 09/29/19   [provider]    Allergies    Patient has no known allergies.  Review of Systems   Review of Systems  Constitutional: Negative for chills and fever.  HENT: Negative for congestion and sore throat.   Respiratory: Negative for shortness of breath.   Cardiovascular: Negative for chest pain.  Gastrointestinal: Negative for abdominal pain.  Genitourinary: Negative for enuresis.  Musculoskeletal: Negative for back pain.       Left hip and leg pain.  Skin: Negative for rash.  Neurological: Negative for headaches.  Hematological: Does not bruise/bleed easily.    Physical Exam Updated Vital Signs BP (!) 161/96 (BP Location: Left Arm)   Pulse 60   Temp 98.4 F (36.9 C) (Oral)   Resp 18   SpO2 100%   Physical Exam Vitals and nursing note reviewed.  Constitutional:      General: She is not in acute distress.    Appearance: She is not ill-appearing.  HENT:     Head: Normocephalic and atraumatic.     Nose: No congestion.  Eyes:     Extraocular  Movements: Extraocular movements intact.     Conjunctiva/sclera: Conjunctivae normal.     Pupils: Pupils are equal, round, and reactive to light.  Cardiovascular:     Rate and Rhythm: Normal rate and regular rhythm.     Pulses: Normal pulses.     Heart sounds: No murmur heard. No friction rub. No gallop.   Pulmonary:     Effort: No respiratory distress.     Breath sounds: No wheezing, rhonchi or rales.  Abdominal:     Palpations: Abdomen is soft.     Tenderness: There is no abdominal tenderness.  Musculoskeletal:     Cervical back: No rigidity.     Right lower leg: No edema.     Left lower leg: No edema.     Comments: Patient spine was palpated it was nontender to palpation, no step-off deformities present.  Patient's lower extremities were visualized there is no internal or external rotation, no leg shortening present, she is notably tender along her trochanter, no deformities present.  She had full range of motion at both hips, knees, ankles, neurovascular fully intact.  Patient positive bilateral straight leg raise.  Skin:    General: Skin is warm and dry.  Neurological:     Mental Status: She is alert.     GCS: GCS eye subscore is 4. GCS verbal subscore is 5. GCS motor subscore is 6.     Cranial Nerves: No facial asymmetry.     Motor: No weakness.     Coordination: Romberg sign negative. Finger-Nose-Finger Test normal.     Comments: Cranial nerves II through XII are grossly intact.  Patient is notably word finding.  Psychiatric:        Mood and Affect: Mood normal.     ED Results / Procedures / Treatments   Labs (all labs ordered are listed, but only abnormal results are displayed) Labs Reviewed - No data to display  EKG None  Radiology DG Knee Complete 4 Views Left  Result Date: 11/18/2020 CLINICAL DATA:  Onset left knee pain yesterday.  No known injury. EXAM: LEFT KNEE - COMPLETE 4+ VIEW COMPARISON:  Plain films left knee 07/01/2014. FINDINGS: No acute bony or  joint abnormality is identified. Advanced for  age tricompartmental osteoarthritis is again seen and is unchanged in appearance. Mild fragmentation of the tibial tuberosity is consistent with old Osgood-Schlatter disease. 1.8 cm calcification projecting posterior to the proximal tibia is likely a loose body in a Baker's cyst and new since the prior examination. IMPRESSION: No acute abnormality. Advanced for age tricompartmental osteoarthritis is similar in appearance to the 2015 exam. 1.8 cm calcification posterior to the proximal tibia is likely a loose body in a Baker's cyst and new since the prior exam. Old Osgood-Schlatter disease. Electronically Signed   By: Drusilla Kannerhomas  Dalessio M.D.   On: 11/18/2020 14:43   DG Hip Unilat With Pelvis 2-3 Views Left  Result Date: 11/18/2020 CLINICAL DATA:  Onset left hip pain yesterday.  No known injury. EXAM: DG HIP (WITH OR WITHOUT PELVIS) 2-3V LEFT COMPARISON:  None. FINDINGS: There is no evidence of hip fracture or dislocation. There is no evidence of arthropathy or other focal bone abnormality. Right hip arthroplasty noted. IMPRESSION: Negative exam. Electronically Signed   By: Drusilla Kannerhomas  Dalessio M.D.   On: 11/18/2020 14:41    Procedures Procedures   Medications Ordered in ED Medications - No data to display  ED Course  I have reviewed the triage vital signs and the nursing notes.  Pertinent labs & imaging results that were available during my care of the patient were reviewed by me and considered in my medical decision making (see chart for details).    MDM Rules/Calculators/A&P                          Initial impression-patient presents with left hip and leg pain.  She is alert, does not appear in acute distress, vital signs reassuring.  Will obtain imaging of her left hip and knee for further evaluation.  Work-up-x-ray of left knee negative for acute findings, shows tricompartment osteoarthritis appears to be stable from x-ray of 2015, 1.8 cm  calcification posterior to the proximal tibia likely loose body and Baker's cyst.  Hip x-ray negative for acute findings.  Rule out-I have low suspicion for spinal fracture or spinal cord abnormality as patient denies urinary incontinency, retention, difficulty with bowel movements, denies saddle paresthesias.  Spine was palpated there is no step-off, crepitus or gross deformities felt, patient had  5/5 strength, full range of motion, neurovascular fully intact in the lower extremities.  Low suspicion for hip or knee fracture as imaging is both negative for acute findings.  Low suspicion for tendon damage as patient has full range of motion in those areas, no  noted  deformities present.  Imaging negative for fractures or dislocation.  Plan-  1.  Hip pain-suspect secondary due to muscular strain, will provide patient with steroids, recommend the counter pain medication, follow-up with PCP for further evaluation.  2.  Knee pain-multifactorial osteoarthritis versus muscular strain, will recommend over-the-counter pain medications, follow-up PCP for further evaluation.  Vital signs have remained stable, no indication for hospital admission.  Patient discussed with attending and they agreed with assessment and plan.  Patient given at home care as well strict return precautions.  Patient verbalized that they understood agreed to said plan.   Final Clinical Impression(s) / ED Diagnoses Final diagnoses:  Left hip pain  Acute pain of left knee    Rx / DC Orders ED Discharge Orders         Ordered    predniSONE (DELTASONE) 20 MG tablet  Daily  11/18/20 1531           Carroll Sage, PA-C 11/18/20 1532    Derwood Kaplan, MD 11/18/20 2107

## 2020-11-18 NOTE — ED Notes (Signed)
Pt called asking for water, unable to get pill down. Pt states she took home Vicodin for pain.

## 2020-11-18 NOTE — ED Triage Notes (Signed)
Pt arrived via walk in, c/o left leg pain, radiating all the way up to hip and down to foot that started yesterday. Denies any trauma to area.

## 2020-11-18 NOTE — Discharge Instructions (Addendum)
You have been seen here for knee and hip pain.  I have started you on steroids please take as prescribed.  Please beware this medication can increase your heart rate, increased water retention, increase your blood sugar and blood pressure this will all resolved 5 days after your last dose.  I recommend taking over-the-counter pain medications like ibuprofen and/or Tylenol every 6 as needed.  Please follow dosage and on the back of bottle.  I also recommend applying heat to the area and stretching out the muscles as this will help decrease stiffness and pain.  I have given you information on exercises please follow.  Please follow-up with your PCP for further evaluation.  Come back to the emergency department if you develop chest pain, shortness of breath, severe abdominal pain, uncontrolled nausea, vomiting, diarrhea.

## 2020-12-27 ENCOUNTER — Ambulatory Visit: Payer: Medicare HMO | Admitting: Gastroenterology

## 2021-02-01 ENCOUNTER — Ambulatory Visit (INDEPENDENT_AMBULATORY_CARE_PROVIDER_SITE_OTHER): Payer: Medicare HMO

## 2021-02-01 ENCOUNTER — Other Ambulatory Visit: Payer: Self-pay

## 2021-02-01 ENCOUNTER — Other Ambulatory Visit: Payer: Self-pay | Admitting: Podiatry

## 2021-02-01 ENCOUNTER — Ambulatory Visit: Payer: Medicare HMO | Admitting: Podiatry

## 2021-02-01 DIAGNOSIS — B351 Tinea unguium: Secondary | ICD-10-CM | POA: Diagnosis not present

## 2021-02-01 DIAGNOSIS — M6740 Ganglion, unspecified site: Secondary | ICD-10-CM | POA: Diagnosis not present

## 2021-02-01 DIAGNOSIS — M67472 Ganglion, left ankle and foot: Secondary | ICD-10-CM | POA: Diagnosis not present

## 2021-02-01 DIAGNOSIS — M67479 Ganglion, unspecified ankle and foot: Secondary | ICD-10-CM

## 2021-02-01 DIAGNOSIS — M79672 Pain in left foot: Secondary | ICD-10-CM

## 2021-02-01 MED ORDER — CICLOPIROX 8 % EX SOLN: Freq: Every day | CUTANEOUS | 0 refills | Status: DC

## 2021-02-07 ENCOUNTER — Encounter: Payer: Self-pay | Admitting: Podiatry

## 2021-02-07 NOTE — Progress Notes (Signed)
Subjective:  Patient ID: Christina Ray, female    DOB: January 17, 1956,  MRN: [id number]  Chief Complaint  Patient presents with   Foot Pain    Left foot pain     65 y.o. female presents with the above complaint.  Patient presents with primary care left ankle ganglion cyst that has been there for a while.  Patient states the main causes some pain whenever something rubs against it.  She has not had it in the past.  It came out of nowhere has progressive gotten worse.  She would like to have it drained.  She does not want to surgically excise it out yet.  She also has secondary complaint of left hallux thickened elongated dystrophic toenail x1.  Patient would like to discuss treatment options for this.  There is a mycotic nature to it.  She has not seen anyone else prior to seeing me.  She has mild pain to both the soft tissue mass and the fungus.  Pain scale is 4 out of 10.   Review of Systems: Negative except as noted in the HPI. Denies N/V/F/Ch.  Past Medical History:  Diagnosis Date   Allergy    Anxiety    Arthritis    Asthma    Chronic back pain    Complication of anesthesia    Diverticulitis    GERD (gastroesophageal reflux disease)    Hyperlipidemia    Hypertension    PONV (postoperative nausea and vomiting)    Vertigo     Current Outpatient Medications:    ciclopirox (PENLAC) 8 % solution, Apply topically at bedtime. Apply over nail and surrounding skin. Apply daily over previous coat. After seven (7) days, may remove with alcohol and continue cycle., Disp: 6.6 mL, Rfl: 0   atenolol (TENORMIN) 50 MG tablet, TAKE 1 TABLET BY MOUTH EVERY DAY, Disp: 90 tablet, Rfl: 2   atorvastatin (LIPITOR) 20 MG tablet, Take 20 mg by mouth daily., Disp: , Rfl:    Calcium-Magnesium-Zinc (CAL-MAG-ZINC PO), Take 1 tablet by mouth 2 (two) times a week., Disp: , Rfl:    cefadroxil (DURICEF) 500 MG capsule, Take 1 capsule (500 mg total) by mouth 2 (two) times daily., Disp: 28 capsule, Rfl: 0    celecoxib (CELEBREX) 200 MG capsule, Take 1 capsule (200 mg total) by mouth daily., Disp: 30 capsule, Rfl: 2   Cyanocobalamin (VITAMIN B-12) 2500 MCG SUBL, Take 2,500 mcg by mouth 2 (two) times a week., Disp: , Rfl:    docusate sodium (COLACE) 100 MG capsule, Take 1 capsule (100 mg total) by mouth 2 (two) times daily. (Patient taking differently: Take 100 mg by mouth daily as needed for moderate constipation. ), Disp: 60 capsule, Rfl: 0   doxycycline (VIBRAMYCIN) 100 MG capsule, Take 1 capsule (100 mg total) by mouth 2 (two) times daily., Disp: 28 capsule, Rfl: 0   fluticasone (FLONASE) 50 MCG/ACT nasal spray, Place 2 sprays into both nostrils daily., Disp: 16 g, Rfl: 6   hydrochlorothiazide (HYDRODIURIL) 25 MG tablet, Take 25 mg by mouth daily., Disp: , Rfl:    HYDROcodone-acetaminophen (NORCO/VICODIN) 5-325 MG tablet, Take 1 tablet by mouth every 4 (four) hours as needed for moderate pain (pain score 4-6)., Disp: 30 tablet, Rfl: 0   hydrOXYzine (ATARAX/VISTARIL) 50 MG tablet, Take 50 mg by mouth 3 (three) times daily., Disp: , Rfl:    lisinopril (ZESTRIL) 40 MG tablet, Take 40 mg by mouth daily., Disp: , Rfl:    loratadine (CLARITIN) 10 MG tablet,  Take 10 mg by mouth daily., Disp: , Rfl:    meloxicam (MOBIC) 15 MG tablet, Take 15 mg by mouth daily., Disp: , Rfl:    mineral oil light external liquid, Apply 1 application topically 2 (two) times daily as needed (inner ear itching.)., Disp: , Rfl:    montelukast (SINGULAIR) 10 MG tablet, TAKE 1 TABLET BY MOUTH EVERYDAY AT BEDTIME, Disp: 90 tablet, Rfl: 1   Multiple Vitamin (MULTIVITAMIN WITH MINERALS) TABS tablet, Take 1 tablet by mouth daily., Disp: , Rfl:    ondansetron (ZOFRAN) 4 MG tablet, Take 1 tablet (4 mg total) by mouth every 6 (six) hours as needed for nausea. (Patient not taking: Reported on 12/07/2019), Disp: 20 tablet, Rfl: 0   pantoprazole (PROTONIX) 40 MG tablet, Take 40 mg by mouth daily., Disp: , Rfl:    polyethylene glycol-electrolytes  (NULYTELY/GOLYTELY) 420 g solution, Take 4,000 mLs by mouth as directed., Disp: 4000 mL, Rfl: 0   senna (SENOKOT) 8.6 MG TABS tablet, Take 2 tablets (17.2 mg total) by mouth at bedtime. (Patient not taking: Reported on 12/07/2019), Disp: 60 tablet, Rfl: 0   SIMPLY SALINE NA, Place 1 spray into the nose 4 (four) times daily as needed (congestion.)., Disp: , Rfl:    tiZANidine (ZANAFLEX) 2 MG tablet, Take 1 tablet (2 mg total) by mouth every 8 (eight) hours as needed for muscle spasms., Disp: 21 tablet, Rfl: 0   traMADol (ULTRAM) 50 MG tablet, Take 50 mg by mouth every 6 (six) hours as needed., Disp: , Rfl:    Turmeric 500 MG TABS, Take 500 mg by mouth 2 (two) times a week., Disp: , Rfl:    Vitamin D, Ergocalciferol, (DRISDOL) 1.25 MG (50000 UNIT) CAPS capsule, Take 50,000 Units by mouth every Monday., Disp: , Rfl:   Social History   Tobacco Use  Smoking Status Former   Types: Cigarettes   Quit date: 1989   Years since quitting: 33.5  Smokeless Tobacco Never    No Known Allergies Objective:  There were no vitals filed for this visit. There is no height or weight on file to calculate BMI. Constitutional Well developed. Well nourished.  Vascular Dorsalis pedis pulses palpable bilaterally. Posterior tibial pulses palpable bilaterally. Capillary refill normal to all digits.  No cyanosis or clubbing noted. Pedal hair growth normal.  Neurologic Normal speech. Oriented to person, place, and time. Epicritic sensation to light touch grossly present bilaterally.  Dermatologic Left ankle ganglion cyst which transilluminates noted.  Fluctuance noted.  No indurated mass noted.  Appears to be multilobulated.  Left hallux thickened elongated dystrophic toenails x1.  Mild pain on palpation.  Mycotic in nature.  Orthopedic: Normal joint ROM without pain or crepitus bilaterally. No visible deformities. No bony tenderness.   Radiographs: None Assessment:   1. Ganglion cyst of foot   2.  Onychomycosis due to dermatophyte   3. Nail fungus    Plan:  Patient was evaluated and treated and all questions answered.  Left ankle ganglion cyst -I explained to the patient the etiology of ganglion cyst versus treatment options were discussed.  I discussed the recurrence rate which is high for ganglion cyst in extensive detail with the patient.  Ultimately I believe patient would benefit from surgical excision however for now we will hold off on the excision and will go more towards conservative approach with drainage.  If there is a recurrence we will discuss surgical excision.  Patient agrees with plan like to proceed with drainage of the cyst  Procedure: Aspiration drainage of ganglion cyst left ankle -Skin was prepped with Betadine followed by one-to-one mixture of half percent Marcaine plain 1% lidocaine plain 3 cc was injected to provide field block.  Once anesthesia was confirmed, a 18-gauge needle was inserted through the skin into the cyst.  Gelatinous material was adequately drained.  About 5 cc of gelatinous material was drained.  The portal of entry was closed with two-point biotic and a Band-Aid.  Left hallux onychomycosis -Educated the patient on the etiology of onychomycosis and various treatment options associated with improving the fungal load.  I explained to the patient that there is 3 treatment options available to treat the onychomycosis including topical, p.o., laser treatment.  Patient has elected to undergo topical application with Penlac.  I instructed her to apply it twice a day for 6 to 8 months.  Patient states understanding will do so religiously.   No follow-ups on file.

## 2021-03-20 DIAGNOSIS — M67472 Ganglion, left ankle and foot: Secondary | ICD-10-CM | POA: Insufficient documentation

## 2021-03-29 ENCOUNTER — Other Ambulatory Visit: Payer: Self-pay

## 2021-03-29 ENCOUNTER — Ambulatory Visit (INDEPENDENT_AMBULATORY_CARE_PROVIDER_SITE_OTHER): Payer: Medicare HMO | Admitting: Podiatry

## 2021-03-29 DIAGNOSIS — Z01818 Encounter for other preprocedural examination: Secondary | ICD-10-CM | POA: Diagnosis not present

## 2021-03-29 DIAGNOSIS — M67479 Ganglion, unspecified ankle and foot: Secondary | ICD-10-CM | POA: Diagnosis not present

## 2021-03-29 DIAGNOSIS — M79672 Pain in left foot: Secondary | ICD-10-CM

## 2021-03-29 DIAGNOSIS — M898X7 Other specified disorders of bone, ankle and foot: Secondary | ICD-10-CM | POA: Diagnosis not present

## 2021-03-29 DIAGNOSIS — M898X9 Other specified disorders of bone, unspecified site: Secondary | ICD-10-CM | POA: Diagnosis not present

## 2021-03-31 ENCOUNTER — Encounter: Payer: Self-pay | Admitting: Podiatry

## 2021-03-31 NOTE — Progress Notes (Signed)
Subjective:  Patient ID: Christina Ray, female    DOB: 1956-01-16,  MRN: [id number]  Chief Complaint  Patient presents with   Foot Pain    Left foot pain     65 y.o. female presents with the above complaint.  Patient presents with a follow-up of left ganglion cyst as well as a new complaint of left midfoot arthritis/cyst.  Patient states that his came back again is more painful.  The aspiration done last time did not help.  She would like to have it surgically excised out.   Review of Systems: Negative except as noted in the HPI. Denies N/V/F/Ch.  Past Medical History:  Diagnosis Date   Allergy    Anxiety    Arthritis    Asthma    Chronic back pain    Complication of anesthesia    Diverticulitis    GERD (gastroesophageal reflux disease)    Hyperlipidemia    Hypertension    PONV (postoperative nausea and vomiting)    Vertigo     Current Outpatient Medications:    atenolol (TENORMIN) 50 MG tablet, TAKE 1 TABLET BY MOUTH EVERY DAY, Disp: 90 tablet, Rfl: 2   atorvastatin (LIPITOR) 20 MG tablet, Take 20 mg by mouth daily., Disp: , Rfl:    Calcium-Magnesium-Zinc (CAL-MAG-ZINC PO), Take 1 tablet by mouth 2 (two) times a week., Disp: , Rfl:    cefadroxil (DURICEF) 500 MG capsule, Take 1 capsule (500 mg total) by mouth 2 (two) times daily., Disp: 28 capsule, Rfl: 0   celecoxib (CELEBREX) 200 MG capsule, Take 1 capsule (200 mg total) by mouth daily., Disp: 30 capsule, Rfl: 2   ciclopirox (PENLAC) 8 % solution, Apply topically at bedtime. Apply over nail and surrounding skin. Apply daily over previous coat. After seven (7) days, may remove with alcohol and continue cycle., Disp: 6.6 mL, Rfl: 0   Cyanocobalamin (VITAMIN B-12) 2500 MCG SUBL, Take 2,500 mcg by mouth 2 (two) times a week., Disp: , Rfl:    docusate sodium (COLACE) 100 MG capsule, Take 1 capsule (100 mg total) by mouth 2 (two) times daily. (Patient taking differently: Take 100 mg by mouth daily as needed for moderate  constipation. ), Disp: 60 capsule, Rfl: 0   doxycycline (VIBRAMYCIN) 100 MG capsule, Take 1 capsule (100 mg total) by mouth 2 (two) times daily., Disp: 28 capsule, Rfl: 0   fluticasone (FLONASE) 50 MCG/ACT nasal spray, Place 2 sprays into both nostrils daily., Disp: 16 g, Rfl: 6   hydrochlorothiazide (HYDRODIURIL) 25 MG tablet, Take 25 mg by mouth daily., Disp: , Rfl:    HYDROcodone-acetaminophen (NORCO/VICODIN) 5-325 MG tablet, Take 1 tablet by mouth every 4 (four) hours as needed for moderate pain (pain score 4-6)., Disp: 30 tablet, Rfl: 0   hydrOXYzine (ATARAX/VISTARIL) 50 MG tablet, Take 50 mg by mouth 3 (three) times daily., Disp: , Rfl:    lisinopril (ZESTRIL) 40 MG tablet, Take 40 mg by mouth daily., Disp: , Rfl:    loratadine (CLARITIN) 10 MG tablet, Take 10 mg by mouth daily., Disp: , Rfl:    meloxicam (MOBIC) 15 MG tablet, Take 15 mg by mouth daily., Disp: , Rfl:    mineral oil light external liquid, Apply 1 application topically 2 (two) times daily as needed (inner ear itching.)., Disp: , Rfl:    montelukast (SINGULAIR) 10 MG tablet, TAKE 1 TABLET BY MOUTH EVERYDAY AT BEDTIME, Disp: 90 tablet, Rfl: 1   Multiple Vitamin (MULTIVITAMIN WITH MINERALS) TABS tablet, Take 1 tablet  by mouth daily., Disp: , Rfl:    ondansetron (ZOFRAN) 4 MG tablet, Take 1 tablet (4 mg total) by mouth every 6 (six) hours as needed for nausea. (Patient not taking: Reported on 12/07/2019), Disp: 20 tablet, Rfl: 0   pantoprazole (PROTONIX) 40 MG tablet, Take 40 mg by mouth daily., Disp: , Rfl:    polyethylene glycol-electrolytes (NULYTELY/GOLYTELY) 420 g solution, Take 4,000 mLs by mouth as directed., Disp: 4000 mL, Rfl: 0   senna (SENOKOT) 8.6 MG TABS tablet, Take 2 tablets (17.2 mg total) by mouth at bedtime. (Patient not taking: Reported on 12/07/2019), Disp: 60 tablet, Rfl: 0   SIMPLY SALINE NA, Place 1 spray into the nose 4 (four) times daily as needed (congestion.)., Disp: , Rfl:    tiZANidine (ZANAFLEX) 2 MG  tablet, Take 1 tablet (2 mg total) by mouth every 8 (eight) hours as needed for muscle spasms., Disp: 21 tablet, Rfl: 0   traMADol (ULTRAM) 50 MG tablet, Take 50 mg by mouth every 6 (six) hours as needed., Disp: , Rfl:    Turmeric 500 MG TABS, Take 500 mg by mouth 2 (two) times a week., Disp: , Rfl:    Vitamin D, Ergocalciferol, (DRISDOL) 1.25 MG (50000 UNIT) CAPS capsule, Take 50,000 Units by mouth every Monday., Disp: , Rfl:   Social History   Tobacco Use  Smoking Status Former   Types: Cigarettes   Quit date: 1989   Years since quitting: 33.7  Smokeless Tobacco Never    No Known Allergies Objective:  There were no vitals filed for this visit. There is no height or weight on file to calculate BMI. Constitutional Well developed. Well nourished.  Vascular Dorsalis pedis pulses palpable bilaterally. Posterior tibial pulses palpable bilaterally. Capillary refill normal to all digits.  No cyanosis or clubbing noted. Pedal hair growth normal.  Neurologic Normal speech. Oriented to person, place, and time. Epicritic sensation to light touch grossly present bilaterally.  Dermatologic Left ankle ganglion cyst which transilluminates noted.  Fluctuance noted.  No indurated mass noted.  Appears to be multilobulated.  Left dorsal midfoot arthritis noted with mild cyst formation.  Pain on palpation to the exostosis.  No pain at the Lisfranc interval.  No extensor or flexor tendinitis noted.  Left hallux thickened elongated dystrophic toenails x1.  Mild pain on palpation.  Mycotic in nature.  Orthopedic: Normal joint ROM without pain or crepitus bilaterally. No visible deformities. No bony tenderness.   Radiographs: None Assessment:   1. Preoperative examination   2. Ganglion cyst of foot   3. Bony exostosis     Plan:  Patient was evaluated and treated and all questions answered.  Left ankle ganglion cyst -Clinically her ganglion cyst has returned.  At this time I discussed with  her that given that this is recurrent segment without any resolve meant and continuing to hurt her I believe she would benefit from surgical excision of the soft tissue mass.  Patient agrees with plan would like to have it removed.  She has failed all conservative treatment options for it.  I discussed my preoperative intraoperative and postoperative plan in extensive detail.  She can be weightbearing as tolerated with a cam boot after the surgery.  Cam boot was dispensed. -Informed surgical risk consent was reviewed and read aloud to the patient.  I reviewed the films.  I have discussed my findings with the patient in great detail.  I have discussed all risks including but not limited to infection, stiffness, scarring, limp,  disability, deformity, damage to blood vessels and nerves, numbness, poor healing, need for braces, arthritis, chronic pain, amputation, death.  All benefits and realistic expectations discussed in great detail.  I have made no promises as to the outcome.  I have provided realistic expectations.  I have offered the patient a 2nd opinion, which they have declined and assured me they preferred to proceed despite the risks   Left dorsal midfoot exostosis -I explained the patient the etiology of exostosis emergency room and options were discussed.  I discussed with her that given that she has failed conservative treatment options including over-the-counter topical medication as well as shoe gear modification I believe she will benefit from exostectomy of the dorsal midfoot arthritis.  Patient agrees and plan like to proceed with the surgery at the same time as removal of ankle cyst. I discussed my preoperative intraoperative and postoperative plan in extensive detail.  She can be weightbearing as tolerated with a cam boot after the surgery.  Cam boot was dispensed. -Informed surgical risk consent was reviewed and read aloud to the patient.  I reviewed the films.  I have discussed my findings  with the patient in great detail.  I have discussed all risks including but not limited to infection, stiffness, scarring, limp, disability, deformity, damage to blood vessels and nerves, numbness, poor healing, need for braces, arthritis, chronic pain, amputation, death.  All benefits and realistic expectations discussed in great detail.  I have made no promises as to the outcome.  I have provided realistic expectations.  I have offered the patient a 2nd opinion, which they have declined and assured me they preferred to proceed despite the risks   Left hallux onychomycosis -Educated the patient on the etiology of onychomycosis and various treatment options associated with improving the fungal load.  I explained to the patient that there is 3 treatment options available to treat the onychomycosis including topical, p.o., laser treatment.  Patient has elected to undergo topical application with Penlac.  I instructed her to apply it twice a day for 6 to 8 months.  Patient states understanding will do so religiously.   No follow-ups on file.

## 2021-04-03 DIAGNOSIS — M898X9 Other specified disorders of bone, unspecified site: Secondary | ICD-10-CM | POA: Insufficient documentation

## 2021-04-04 ENCOUNTER — Telehealth: Payer: Self-pay | Admitting: Urology

## 2021-04-04 NOTE — Telephone Encounter (Signed)
DOS - 04/24/21  TARSAL EXOSTECTOMY LEFT --- 35361 EXCISION SOFT TISSUE LEFT --- 44315   [employer] EFFECTIVE DATE - 07/23/20  PER [facility] WEBSITE FOR CPT CODES 40086 AND 76195 NO PRIOR AUTH IS REQUIRED.

## 2021-04-14 IMAGING — RF DG HIP (WITH PELVIS) OPERATIVE*R*
1 series · 9 of 9 positions shown · non-contrast
Comparison: None.

CLINICAL DATA: Right total hip replacement.

EXAM:
OPERATIVE RIGHT HIP (WITH PELVIS IF PERFORMED)
TECHNIQUE: Fluoroscopic spot image(s) were submitted for interpretation
post-operatively.

[Series 1: run · 9 of 9 slices shown]
[im 1/9]
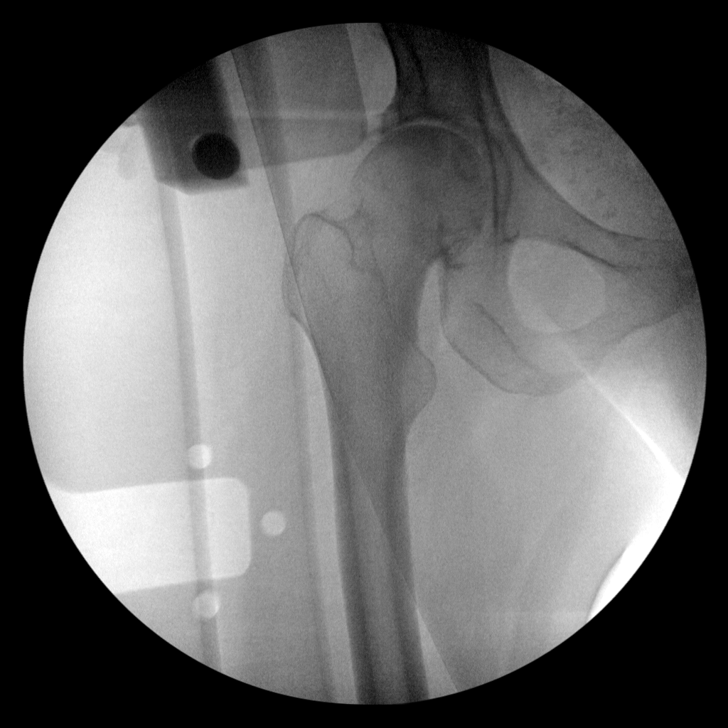
[im 2/9]
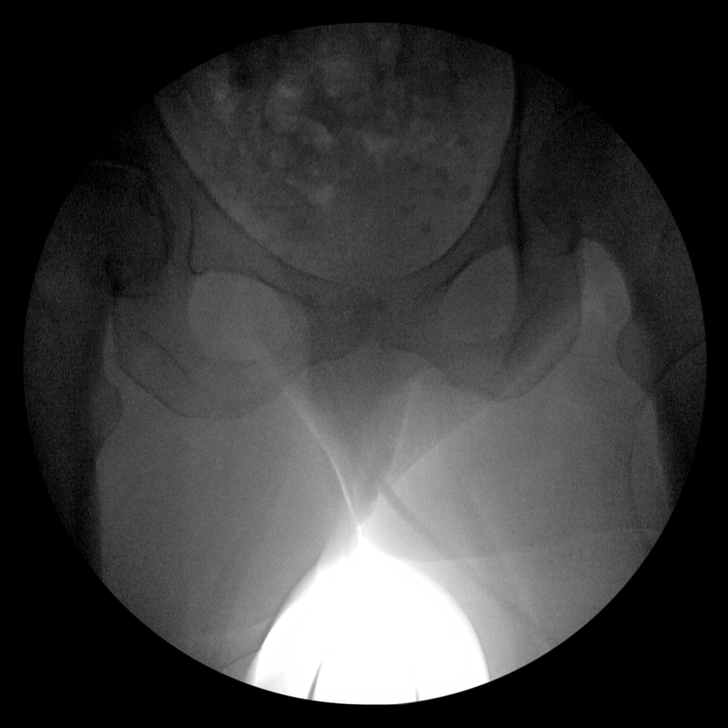
[im 3/9]
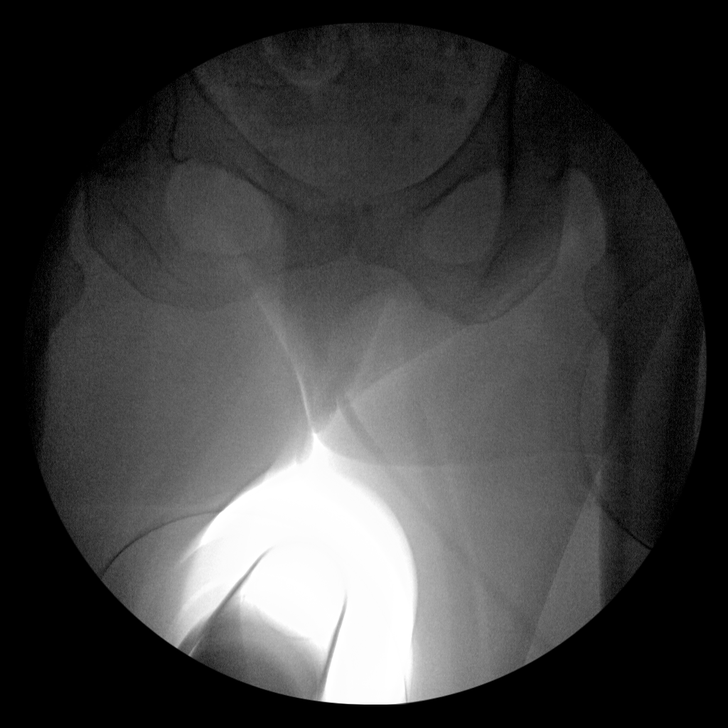
[im 4/9]
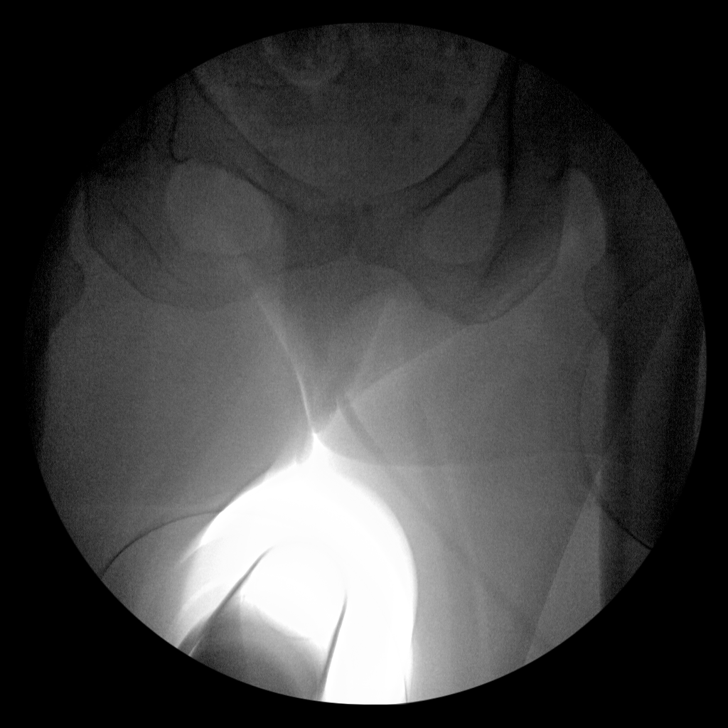
[im 5/9]
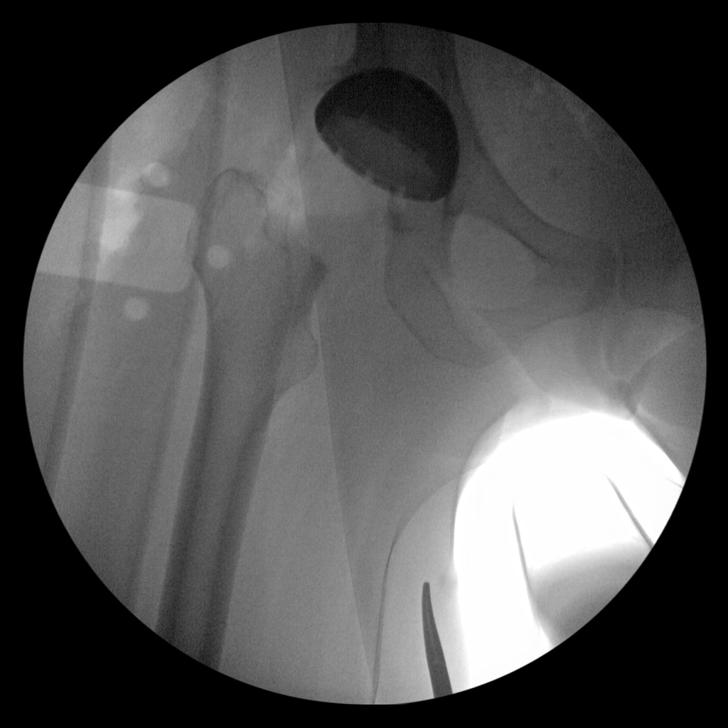
[im 6/9]
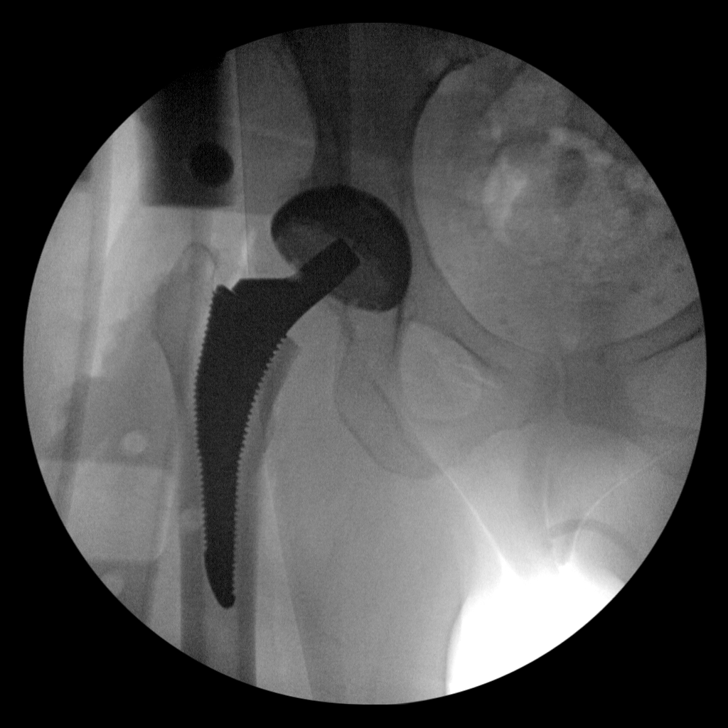
[im 7/9]
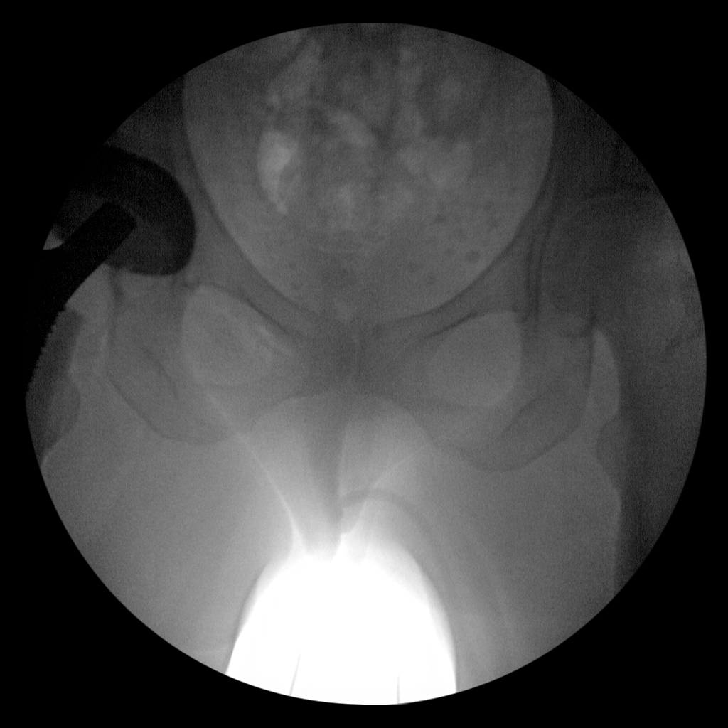
[im 8/9]
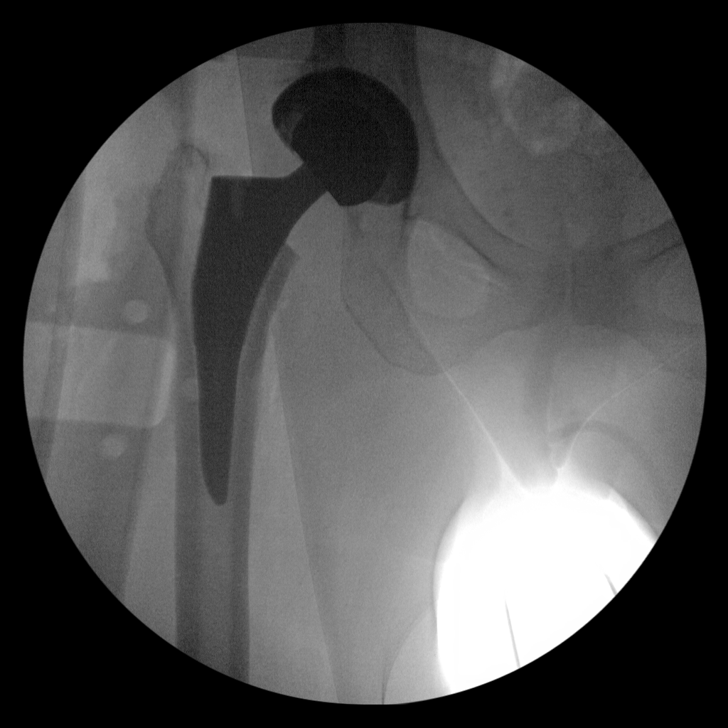
[im 9/9]
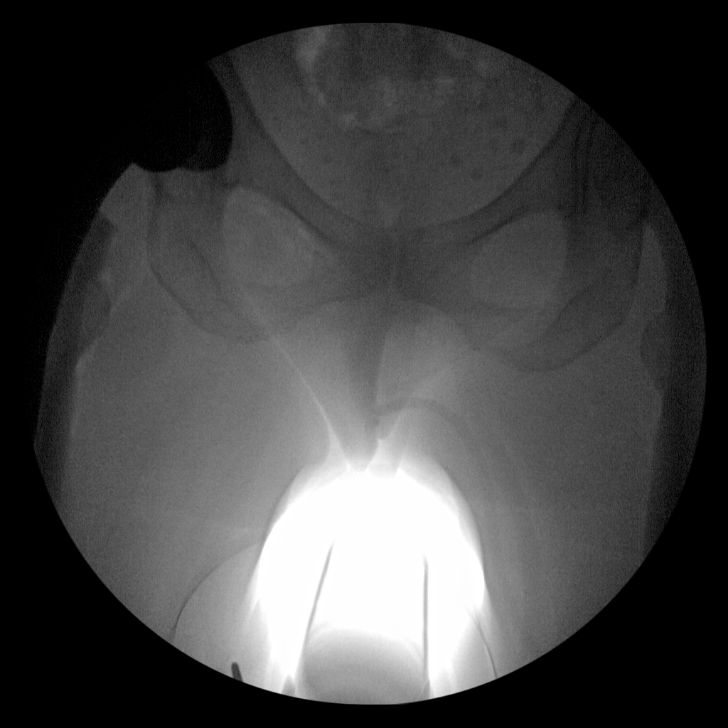

[9 of 9 positions shown; findings below may reference images not displayed]

FINDINGS: Eight fluoroscopic spot views of the pelvis and right hip provided
during right hip arthroplasty. Normal hardware alignment. Total
fluoroscopy time 27 seconds.
IMPRESSION: Intraoperative fluoroscopy during right hip arthroplasty.

## 2021-04-14 IMAGING — DX DG PORTABLE PELVIS
1 series · 1 of 1 positions shown · non-contrast
Comparison: Intraoperative films from earlier in the same day.

CLINICAL DATA: Status post right hip replacement

EXAM:
PORTABLE PELVIS 1-2 VIEWS

[pelvis ap]
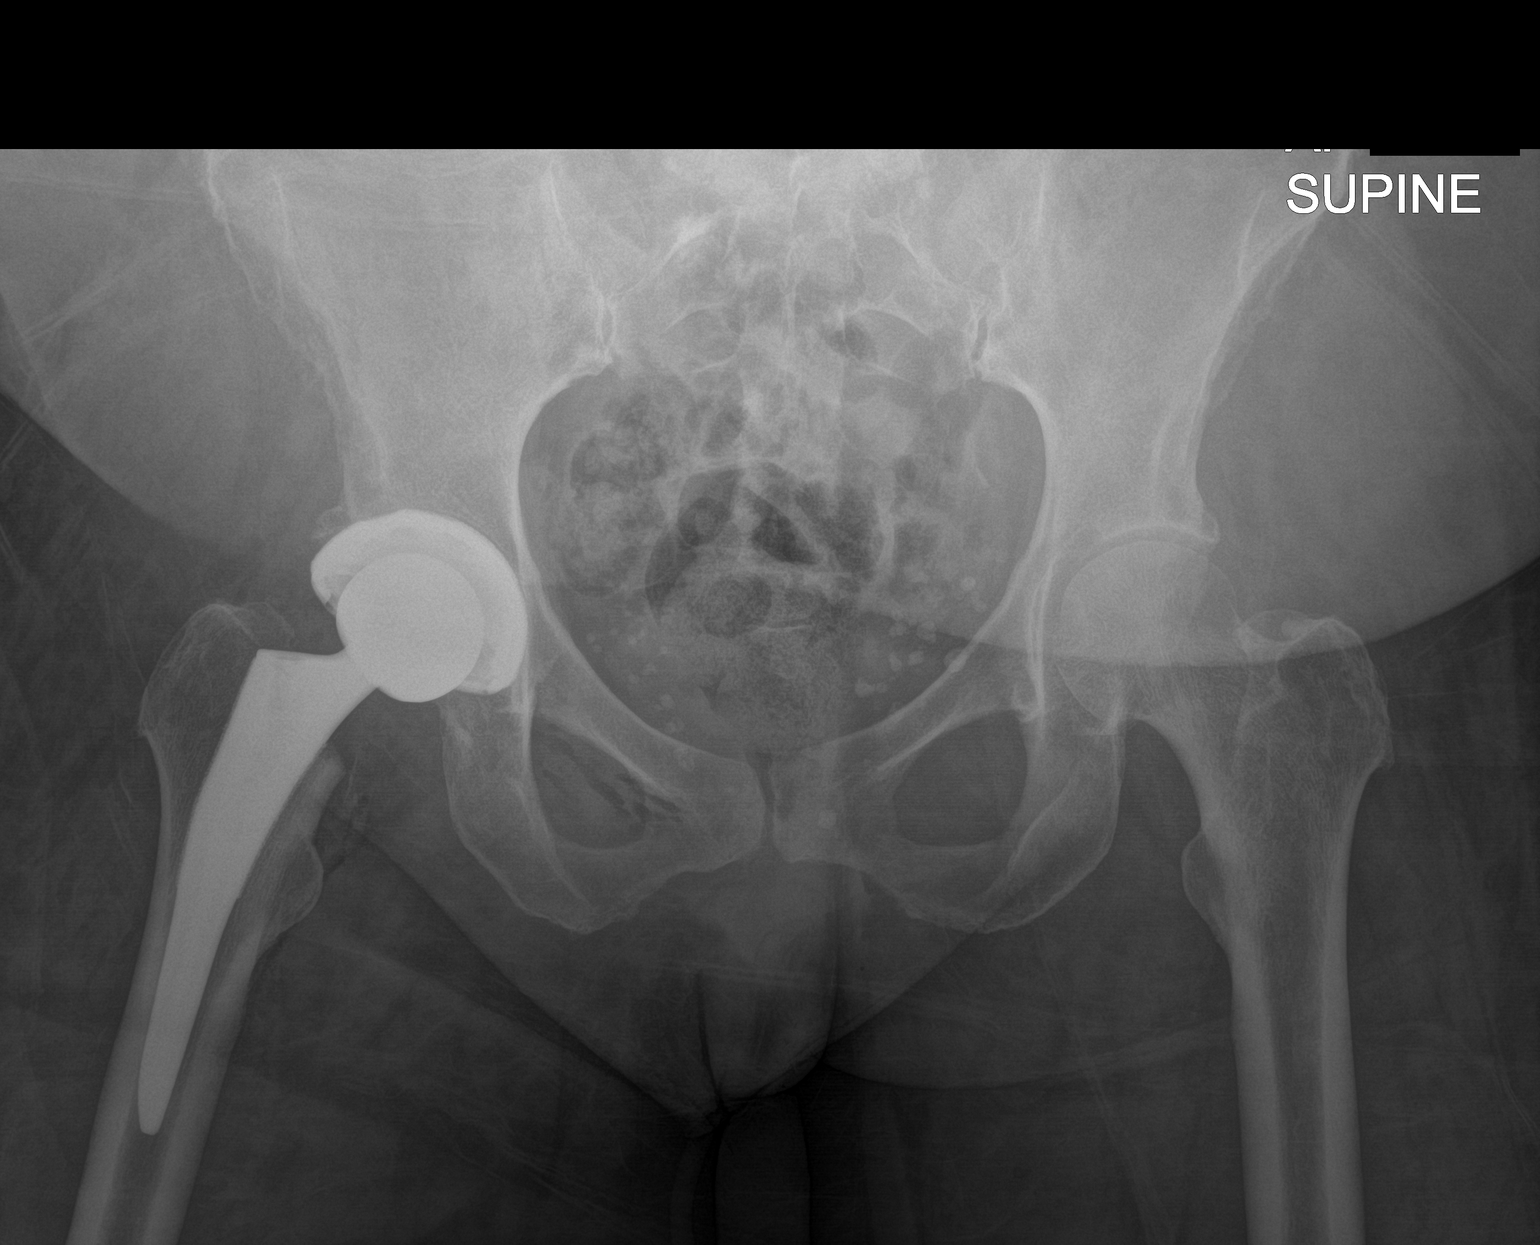

[1 of 1 positions shown; findings below may reference images not displayed]

FINDINGS: Right hip replacement is noted in satisfactory position. No acute
bony or soft tissue abnormality is noted.
IMPRESSION: Status post right hip replacement.

## 2021-04-24 ENCOUNTER — Other Ambulatory Visit: Payer: Self-pay | Admitting: Podiatry

## 2021-04-24 ENCOUNTER — Encounter: Payer: Self-pay | Admitting: Podiatry

## 2021-04-24 DIAGNOSIS — M67472 Ganglion, left ankle and foot: Secondary | ICD-10-CM | POA: Diagnosis not present

## 2021-04-24 MED ORDER — IBUPROFEN 800 MG PO TABS: 800.0000 mg | ORAL_TABLET | Freq: Four times a day (QID) | ORAL | 1 refills | Status: DC | PRN

## 2021-04-24 MED ORDER — OXYCODONE-ACETAMINOPHEN 5-325 MG PO TABS: 1.0000 | ORAL_TABLET | ORAL | 0 refills | Status: DC | PRN

## 2021-04-25 ENCOUNTER — Telehealth: Payer: Self-pay | Admitting: *Deleted

## 2021-04-25 NOTE — Telephone Encounter (Signed)
Patient is calling because her foot is bleeding a little from surgery 1 day ago. Returned call back to patient, explained that a little bleeding and pain is normal after surgery.I went over the after care instructions w/ patient,verbalized understanding.

## 2021-04-28 ENCOUNTER — Encounter: Payer: Self-pay | Admitting: Podiatry

## 2021-05-03 ENCOUNTER — Other Ambulatory Visit: Payer: Self-pay

## 2021-05-03 ENCOUNTER — Ambulatory Visit (INDEPENDENT_AMBULATORY_CARE_PROVIDER_SITE_OTHER): Payer: Medicare HMO | Admitting: Podiatry

## 2021-05-03 DIAGNOSIS — M67479 Ganglion, unspecified ankle and foot: Secondary | ICD-10-CM

## 2021-05-03 DIAGNOSIS — M898X9 Other specified disorders of bone, unspecified site: Secondary | ICD-10-CM

## 2021-05-03 DIAGNOSIS — Z9889 Other specified postprocedural states: Secondary | ICD-10-CM

## 2021-05-03 MED ORDER — CICLOPIROX 8 % EX SOLN: Freq: Every day | CUTANEOUS | 0 refills | Status: DC

## 2021-05-08 NOTE — Progress Notes (Signed)
Subjective:  Patient ID: Christina Ray, female    DOB: [birth year],  MRN: [id number]  Chief Complaint  Patient presents with   Routine Post Op    DOS 10.3.22    DOS: 04/24/2021 Procedure: Excision of ganglion cyst left foot  65 y.o. female returns for post-op check.  Patient states she is doing well.  Occasional numbness and tingling.  She is ambulating with a surgical shoe.  She denies any other acute issues.  No nausea fever chills vomiting.  Pain is controlled  Review of Systems: Negative except as noted in the HPI. Denies N/V/F/Ch.  Past Medical History:  Diagnosis Date   Allergy    Anxiety    Arthritis    Asthma    Chronic back pain    Complication of anesthesia    Diverticulitis    GERD (gastroesophageal reflux disease)    Hyperlipidemia    Hypertension    PONV (postoperative nausea and vomiting)    Vertigo     Current Outpatient Medications:    ciclopirox (PENLAC) 8 % solution, Apply topically at bedtime. Apply over nail and surrounding skin. Apply daily over previous coat. After seven (7) days, may remove with alcohol and continue cycle., Disp: 6.6 mL, Rfl: 0   atenolol (TENORMIN) 50 MG tablet, TAKE 1 TABLET BY MOUTH EVERY DAY, Disp: 90 tablet, Rfl: 2   atorvastatin (LIPITOR) 20 MG tablet, Take 20 mg by mouth daily., Disp: , Rfl:    Calcium-Magnesium-Zinc (CAL-MAG-ZINC PO), Take 1 tablet by mouth 2 (two) times a week., Disp: , Rfl:    cefadroxil (DURICEF) 500 MG capsule, Take 1 capsule (500 mg total) by mouth 2 (two) times daily., Disp: 28 capsule, Rfl: 0   celecoxib (CELEBREX) 200 MG capsule, Take 1 capsule (200 mg total) by mouth daily., Disp: 30 capsule, Rfl: 2   ciclopirox (PENLAC) 8 % solution, Apply topically at bedtime. Apply over nail and surrounding skin. Apply daily over previous coat. After seven (7) days, may remove with alcohol and continue cycle., Disp: 6.6 mL, Rfl: 0   Cyanocobalamin (VITAMIN B-12) 2500 MCG SUBL, Take 2,500 mcg by mouth 2 (two) times a  week., Disp: , Rfl:    docusate sodium (COLACE) 100 MG capsule, Take 1 capsule (100 mg total) by mouth 2 (two) times daily. (Patient taking differently: Take 100 mg by mouth daily as needed for moderate constipation. ), Disp: 60 capsule, Rfl: 0   doxycycline (VIBRAMYCIN) 100 MG capsule, Take 1 capsule (100 mg total) by mouth 2 (two) times daily., Disp: 28 capsule, Rfl: 0   fluticasone (FLONASE) 50 MCG/ACT nasal spray, Place 2 sprays into both nostrils daily., Disp: 16 g, Rfl: 6   hydrochlorothiazide (HYDRODIURIL) 25 MG tablet, Take 25 mg by mouth daily., Disp: , Rfl:    HYDROcodone-acetaminophen (NORCO/VICODIN) 5-325 MG tablet, Take 1 tablet by mouth every 4 (four) hours as needed for moderate pain (pain score 4-6)., Disp: 30 tablet, Rfl: 0   hydrOXYzine (ATARAX/VISTARIL) 50 MG tablet, Take 50 mg by mouth 3 (three) times daily., Disp: , Rfl:    ibuprofen (ADVIL) 800 MG tablet, Take 1 tablet (800 mg total) by mouth every 6 (six) hours as needed., Disp: 60 tablet, Rfl: 1   lisinopril (ZESTRIL) 40 MG tablet, Take 40 mg by mouth daily., Disp: , Rfl:    loratadine (CLARITIN) 10 MG tablet, Take 10 mg by mouth daily., Disp: , Rfl:    meloxicam (MOBIC) 15 MG tablet, Take 15 mg by mouth daily., Disp: ,  Rfl:    mineral oil light external liquid, Apply 1 application topically 2 (two) times daily as needed (inner ear itching.)., Disp: , Rfl:    montelukast (SINGULAIR) 10 MG tablet, TAKE 1 TABLET BY MOUTH EVERYDAY AT BEDTIME, Disp: 90 tablet, Rfl: 1   Multiple Vitamin (MULTIVITAMIN WITH MINERALS) TABS tablet, Take 1 tablet by mouth daily., Disp: , Rfl:    ondansetron (ZOFRAN) 4 MG tablet, Take 1 tablet (4 mg total) by mouth every 6 (six) hours as needed for nausea. (Patient not taking: Reported on 12/07/2019), Disp: 20 tablet, Rfl: 0   oxyCODONE-acetaminophen (PERCOCET) 5-325 MG tablet, Take 1 tablet by mouth every 4 (four) hours as needed for severe pain., Disp: 30 tablet, Rfl: 0   pantoprazole (PROTONIX) 40 MG  tablet, Take 40 mg by mouth daily., Disp: , Rfl:    polyethylene glycol-electrolytes (NULYTELY/GOLYTELY) 420 g solution, Take 4,000 mLs by mouth as directed., Disp: 4000 mL, Rfl: 0   senna (SENOKOT) 8.6 MG TABS tablet, Take 2 tablets (17.2 mg total) by mouth at bedtime. (Patient not taking: Reported on 12/07/2019), Disp: 60 tablet, Rfl: 0   SIMPLY SALINE NA, Place 1 spray into the nose 4 (four) times daily as needed (congestion.)., Disp: , Rfl:    tiZANidine (ZANAFLEX) 2 MG tablet, Take 1 tablet (2 mg total) by mouth every 8 (eight) hours as needed for muscle spasms., Disp: 21 tablet, Rfl: 0   traMADol (ULTRAM) 50 MG tablet, Take 50 mg by mouth every 6 (six) hours as needed., Disp: , Rfl:    Turmeric 500 MG TABS, Take 500 mg by mouth 2 (two) times a week., Disp: , Rfl:    Vitamin D, Ergocalciferol, (DRISDOL) 1.25 MG (50000 UNIT) CAPS capsule, Take 50,000 Units by mouth every Monday., Disp: , Rfl:   Social History   Tobacco Use  Smoking Status Former   Types: Cigarettes   Quit date: 1989   Years since quitting: 33.8  Smokeless Tobacco Never    No Known Allergies Objective:  There were no vitals filed for this visit. There is no height or weight on file to calculate BMI. Constitutional Well developed. Well nourished.  Vascular Foot warm and well perfused. Capillary refill normal to all digits.   Neurologic Normal speech. Oriented to person, place, and time. Epicritic sensation to light touch grossly present bilaterally.  Dermatologic Skin healing well without signs of infection. Skin edges well coapted without signs of infection.  Orthopedic: Tenderness to palpation noted about the surgical site.   Radiographs: None Assessment:   1. Ganglion cyst of foot   2. Bony exostosis   3. Status post foot surgery    Plan:  Patient was evaluated and treated and all questions answered.  S/p foot surgery left -Progressing as expected post-operatively. -XR: None -WB Status: Weightbearing  as tolerated in surgical shoe -Sutures: Intact.  No clinical signs of dehiscence noted.  No complication noted. -Medications: None -Foot redressed.  No follow-ups on file.

## 2021-05-17 ENCOUNTER — Other Ambulatory Visit: Payer: Self-pay

## 2021-05-17 ENCOUNTER — Ambulatory Visit (INDEPENDENT_AMBULATORY_CARE_PROVIDER_SITE_OTHER): Payer: Medicare HMO | Admitting: Podiatry

## 2021-05-17 DIAGNOSIS — M898X9 Other specified disorders of bone, unspecified site: Secondary | ICD-10-CM

## 2021-05-17 DIAGNOSIS — M67479 Ganglion, unspecified ankle and foot: Secondary | ICD-10-CM

## 2021-05-17 DIAGNOSIS — Z9889 Other specified postprocedural states: Secondary | ICD-10-CM

## 2021-05-19 NOTE — Progress Notes (Signed)
Subjective:  Patient ID: Christina Ray, female    DOB: [birth year],  MRN: [id number]  Chief Complaint  Patient presents with   Routine Post Op    DOS 10.3.22    DOS: 04/24/2021 Procedure: Excision of ganglion cyst left foot  65 y.o. female returns for post-op check.  Patient states she is doing well.  Occasional numbness and tingling.  She is ambulating with a surgical shoe.  She denies any other acute issues.  No nausea fever chills vomiting.  Pain is controlled  Review of Systems: Negative except as noted in the HPI. Denies N/V/F/Ch.  Past Medical History:  Diagnosis Date   Allergy    Anxiety    Arthritis    Asthma    Chronic back pain    Complication of anesthesia    Diverticulitis    GERD (gastroesophageal reflux disease)    Hyperlipidemia    Hypertension    PONV (postoperative nausea and vomiting)    Vertigo     Current Outpatient Medications:    atenolol (TENORMIN) 50 MG tablet, TAKE 1 TABLET BY MOUTH EVERY DAY, Disp: 90 tablet, Rfl: 2   atorvastatin (LIPITOR) 20 MG tablet, Take 20 mg by mouth daily., Disp: , Rfl:    Calcium-Magnesium-Zinc (CAL-MAG-ZINC PO), Take 1 tablet by mouth 2 (two) times a week., Disp: , Rfl:    cefadroxil (DURICEF) 500 MG capsule, Take 1 capsule (500 mg total) by mouth 2 (two) times daily., Disp: 28 capsule, Rfl: 0   celecoxib (CELEBREX) 200 MG capsule, Take 1 capsule (200 mg total) by mouth daily., Disp: 30 capsule, Rfl: 2   ciclopirox (PENLAC) 8 % solution, Apply topically at bedtime. Apply over nail and surrounding skin. Apply daily over previous coat. After seven (7) days, may remove with alcohol and continue cycle., Disp: 6.6 mL, Rfl: 0   ciclopirox (PENLAC) 8 % solution, Apply topically at bedtime. Apply over nail and surrounding skin. Apply daily over previous coat. After seven (7) days, may remove with alcohol and continue cycle., Disp: 6.6 mL, Rfl: 0   Cyanocobalamin (VITAMIN B-12) 2500 MCG SUBL, Take 2,500 mcg by mouth 2 (two) times a  week., Disp: , Rfl:    docusate sodium (COLACE) 100 MG capsule, Take 1 capsule (100 mg total) by mouth 2 (two) times daily. (Patient taking differently: Take 100 mg by mouth daily as needed for moderate constipation. ), Disp: 60 capsule, Rfl: 0   doxycycline (VIBRAMYCIN) 100 MG capsule, Take 1 capsule (100 mg total) by mouth 2 (two) times daily., Disp: 28 capsule, Rfl: 0   fluticasone (FLONASE) 50 MCG/ACT nasal spray, Place 2 sprays into both nostrils daily., Disp: 16 g, Rfl: 6   hydrochlorothiazide (HYDRODIURIL) 25 MG tablet, Take 25 mg by mouth daily., Disp: , Rfl:    HYDROcodone-acetaminophen (NORCO/VICODIN) 5-325 MG tablet, Take 1 tablet by mouth every 4 (four) hours as needed for moderate pain (pain score 4-6)., Disp: 30 tablet, Rfl: 0   hydrOXYzine (ATARAX/VISTARIL) 50 MG tablet, Take 50 mg by mouth 3 (three) times daily., Disp: , Rfl:    ibuprofen (ADVIL) 800 MG tablet, Take 1 tablet (800 mg total) by mouth every 6 (six) hours as needed., Disp: 60 tablet, Rfl: 1   lisinopril (ZESTRIL) 40 MG tablet, Take 40 mg by mouth daily., Disp: , Rfl:    loratadine (CLARITIN) 10 MG tablet, Take 10 mg by mouth daily., Disp: , Rfl:    meloxicam (MOBIC) 15 MG tablet, Take 15 mg by mouth daily., Disp: ,  Rfl:    mineral oil light external liquid, Apply 1 application topically 2 (two) times daily as needed (inner ear itching.)., Disp: , Rfl:    montelukast (SINGULAIR) 10 MG tablet, TAKE 1 TABLET BY MOUTH EVERYDAY AT BEDTIME, Disp: 90 tablet, Rfl: 1   Multiple Vitamin (MULTIVITAMIN WITH MINERALS) TABS tablet, Take 1 tablet by mouth daily., Disp: , Rfl:    ondansetron (ZOFRAN) 4 MG tablet, Take 1 tablet (4 mg total) by mouth every 6 (six) hours as needed for nausea. (Patient not taking: Reported on 12/07/2019), Disp: 20 tablet, Rfl: 0   oxyCODONE-acetaminophen (PERCOCET) 5-325 MG tablet, Take 1 tablet by mouth every 4 (four) hours as needed for severe pain., Disp: 30 tablet, Rfl: 0   pantoprazole (PROTONIX) 40 MG  tablet, Take 40 mg by mouth daily., Disp: , Rfl:    polyethylene glycol-electrolytes (NULYTELY/GOLYTELY) 420 g solution, Take 4,000 mLs by mouth as directed., Disp: 4000 mL, Rfl: 0   senna (SENOKOT) 8.6 MG TABS tablet, Take 2 tablets (17.2 mg total) by mouth at bedtime. (Patient not taking: Reported on 12/07/2019), Disp: 60 tablet, Rfl: 0   SIMPLY SALINE NA, Place 1 spray into the nose 4 (four) times daily as needed (congestion.)., Disp: , Rfl:    tiZANidine (ZANAFLEX) 2 MG tablet, Take 1 tablet (2 mg total) by mouth every 8 (eight) hours as needed for muscle spasms., Disp: 21 tablet, Rfl: 0   traMADol (ULTRAM) 50 MG tablet, Take 50 mg by mouth every 6 (six) hours as needed., Disp: , Rfl:    Turmeric 500 MG TABS, Take 500 mg by mouth 2 (two) times a week., Disp: , Rfl:    Vitamin D, Ergocalciferol, (DRISDOL) 1.25 MG (50000 UNIT) CAPS capsule, Take 50,000 Units by mouth every Monday., Disp: , Rfl:   Social History   Tobacco Use  Smoking Status Former   Types: Cigarettes   Quit date: 1989   Years since quitting: 33.8  Smokeless Tobacco Never    No Known Allergies Objective:  There were no vitals filed for this visit. There is no height or weight on file to calculate BMI. Constitutional Well developed. Well nourished.  Vascular Foot warm and well perfused. Capillary refill normal to all digits.   Neurologic Normal speech. Oriented to person, place, and time. Epicritic sensation to light touch grossly present bilaterally.  Dermatologic Skin completely epithelialized.  No clinical signs of infection noted.  No recurrence noted.  Orthopedic: No further tenderness to palpation noted about the surgical site.   Radiographs: None Assessment:   1. Ganglion cyst of foot   2. Bony exostosis   3. Status post foot surgery     Plan:  Patient was evaluated and treated and all questions answered.  S/p foot surgery left -Progressing as expected post-operatively. -XR: None -WB Status:  Weightbearing as tolerated in regular shoes -Sutures: Removed.  No clinical signs of dehiscence noted.  No complication noted. -Medications: None -Patient is a visit officially discharged from my care if any foot and ankle issues arise in the future asked her to come see me.  She states understanding  No follow-ups on file.

## 2021-07-12 DIAGNOSIS — K649 Unspecified hemorrhoids: Secondary | ICD-10-CM | POA: Insufficient documentation

## 2021-08-02 DIAGNOSIS — M4727 Other spondylosis with radiculopathy, lumbosacral region: Secondary | ICD-10-CM | POA: Diagnosis not present

## 2021-08-02 DIAGNOSIS — M9905 Segmental and somatic dysfunction of pelvic region: Secondary | ICD-10-CM | POA: Diagnosis not present

## 2021-08-02 DIAGNOSIS — M5032 Other cervical disc degeneration, mid-cervical region, unspecified level: Secondary | ICD-10-CM | POA: Diagnosis not present

## 2021-08-02 DIAGNOSIS — M9903 Segmental and somatic dysfunction of lumbar region: Secondary | ICD-10-CM | POA: Diagnosis not present

## 2021-08-02 DIAGNOSIS — M9904 Segmental and somatic dysfunction of sacral region: Secondary | ICD-10-CM | POA: Diagnosis not present

## 2021-08-02 DIAGNOSIS — R293 Abnormal posture: Secondary | ICD-10-CM | POA: Diagnosis not present

## 2021-08-02 DIAGNOSIS — M9901 Segmental and somatic dysfunction of cervical region: Secondary | ICD-10-CM | POA: Diagnosis not present

## 2021-08-02 DIAGNOSIS — M9902 Segmental and somatic dysfunction of thoracic region: Secondary | ICD-10-CM | POA: Diagnosis not present

## 2021-08-03 DIAGNOSIS — M4727 Other spondylosis with radiculopathy, lumbosacral region: Secondary | ICD-10-CM | POA: Diagnosis not present

## 2021-08-03 DIAGNOSIS — M9903 Segmental and somatic dysfunction of lumbar region: Secondary | ICD-10-CM | POA: Diagnosis not present

## 2021-08-03 DIAGNOSIS — M5032 Other cervical disc degeneration, mid-cervical region, unspecified level: Secondary | ICD-10-CM | POA: Diagnosis not present

## 2021-08-03 DIAGNOSIS — M9901 Segmental and somatic dysfunction of cervical region: Secondary | ICD-10-CM | POA: Diagnosis not present

## 2021-08-03 DIAGNOSIS — M9902 Segmental and somatic dysfunction of thoracic region: Secondary | ICD-10-CM | POA: Diagnosis not present

## 2021-08-03 DIAGNOSIS — R293 Abnormal posture: Secondary | ICD-10-CM | POA: Diagnosis not present

## 2021-08-03 DIAGNOSIS — M9904 Segmental and somatic dysfunction of sacral region: Secondary | ICD-10-CM | POA: Diagnosis not present

## 2021-08-03 DIAGNOSIS — M9905 Segmental and somatic dysfunction of pelvic region: Secondary | ICD-10-CM | POA: Diagnosis not present

## 2021-08-07 DIAGNOSIS — M9904 Segmental and somatic dysfunction of sacral region: Secondary | ICD-10-CM | POA: Diagnosis not present

## 2021-08-07 DIAGNOSIS — M9902 Segmental and somatic dysfunction of thoracic region: Secondary | ICD-10-CM | POA: Diagnosis not present

## 2021-08-07 DIAGNOSIS — M9905 Segmental and somatic dysfunction of pelvic region: Secondary | ICD-10-CM | POA: Diagnosis not present

## 2021-08-07 DIAGNOSIS — M9903 Segmental and somatic dysfunction of lumbar region: Secondary | ICD-10-CM | POA: Diagnosis not present

## 2021-08-07 DIAGNOSIS — R293 Abnormal posture: Secondary | ICD-10-CM | POA: Diagnosis not present

## 2021-08-07 DIAGNOSIS — M9901 Segmental and somatic dysfunction of cervical region: Secondary | ICD-10-CM | POA: Diagnosis not present

## 2021-08-07 DIAGNOSIS — M5032 Other cervical disc degeneration, mid-cervical region, unspecified level: Secondary | ICD-10-CM | POA: Diagnosis not present

## 2021-08-07 DIAGNOSIS — M4727 Other spondylosis with radiculopathy, lumbosacral region: Secondary | ICD-10-CM | POA: Diagnosis not present

## 2021-08-10 DIAGNOSIS — M5032 Other cervical disc degeneration, mid-cervical region, unspecified level: Secondary | ICD-10-CM | POA: Diagnosis not present

## 2021-08-10 DIAGNOSIS — M9903 Segmental and somatic dysfunction of lumbar region: Secondary | ICD-10-CM | POA: Diagnosis not present

## 2021-08-10 DIAGNOSIS — M9902 Segmental and somatic dysfunction of thoracic region: Secondary | ICD-10-CM | POA: Diagnosis not present

## 2021-08-10 DIAGNOSIS — R293 Abnormal posture: Secondary | ICD-10-CM | POA: Diagnosis not present

## 2021-08-10 DIAGNOSIS — M9901 Segmental and somatic dysfunction of cervical region: Secondary | ICD-10-CM | POA: Diagnosis not present

## 2021-08-10 DIAGNOSIS — M4727 Other spondylosis with radiculopathy, lumbosacral region: Secondary | ICD-10-CM | POA: Diagnosis not present

## 2021-08-10 DIAGNOSIS — M9904 Segmental and somatic dysfunction of sacral region: Secondary | ICD-10-CM | POA: Diagnosis not present

## 2021-08-10 DIAGNOSIS — M9905 Segmental and somatic dysfunction of pelvic region: Secondary | ICD-10-CM | POA: Diagnosis not present

## 2021-08-14 DIAGNOSIS — M9901 Segmental and somatic dysfunction of cervical region: Secondary | ICD-10-CM | POA: Diagnosis not present

## 2021-08-14 DIAGNOSIS — M4727 Other spondylosis with radiculopathy, lumbosacral region: Secondary | ICD-10-CM | POA: Diagnosis not present

## 2021-08-14 DIAGNOSIS — M9904 Segmental and somatic dysfunction of sacral region: Secondary | ICD-10-CM | POA: Diagnosis not present

## 2021-08-14 DIAGNOSIS — M9905 Segmental and somatic dysfunction of pelvic region: Secondary | ICD-10-CM | POA: Diagnosis not present

## 2021-08-14 DIAGNOSIS — M9903 Segmental and somatic dysfunction of lumbar region: Secondary | ICD-10-CM | POA: Diagnosis not present

## 2021-08-14 DIAGNOSIS — M5032 Other cervical disc degeneration, mid-cervical region, unspecified level: Secondary | ICD-10-CM | POA: Diagnosis not present

## 2021-08-14 DIAGNOSIS — R293 Abnormal posture: Secondary | ICD-10-CM | POA: Diagnosis not present

## 2021-08-14 DIAGNOSIS — M9902 Segmental and somatic dysfunction of thoracic region: Secondary | ICD-10-CM | POA: Diagnosis not present

## 2021-08-17 DIAGNOSIS — R293 Abnormal posture: Secondary | ICD-10-CM | POA: Diagnosis not present

## 2021-08-17 DIAGNOSIS — M5032 Other cervical disc degeneration, mid-cervical region, unspecified level: Secondary | ICD-10-CM | POA: Diagnosis not present

## 2021-08-17 DIAGNOSIS — M9905 Segmental and somatic dysfunction of pelvic region: Secondary | ICD-10-CM | POA: Diagnosis not present

## 2021-08-17 DIAGNOSIS — M9902 Segmental and somatic dysfunction of thoracic region: Secondary | ICD-10-CM | POA: Diagnosis not present

## 2021-08-17 DIAGNOSIS — M9903 Segmental and somatic dysfunction of lumbar region: Secondary | ICD-10-CM | POA: Diagnosis not present

## 2021-08-17 DIAGNOSIS — M9904 Segmental and somatic dysfunction of sacral region: Secondary | ICD-10-CM | POA: Diagnosis not present

## 2021-08-17 DIAGNOSIS — M9901 Segmental and somatic dysfunction of cervical region: Secondary | ICD-10-CM | POA: Diagnosis not present

## 2021-08-17 DIAGNOSIS — M4727 Other spondylosis with radiculopathy, lumbosacral region: Secondary | ICD-10-CM | POA: Diagnosis not present

## 2021-08-21 DIAGNOSIS — M5032 Other cervical disc degeneration, mid-cervical region, unspecified level: Secondary | ICD-10-CM | POA: Diagnosis not present

## 2021-08-21 DIAGNOSIS — M4727 Other spondylosis with radiculopathy, lumbosacral region: Secondary | ICD-10-CM | POA: Diagnosis not present

## 2021-08-21 DIAGNOSIS — M9903 Segmental and somatic dysfunction of lumbar region: Secondary | ICD-10-CM | POA: Diagnosis not present

## 2021-08-21 DIAGNOSIS — R293 Abnormal posture: Secondary | ICD-10-CM | POA: Diagnosis not present

## 2021-08-21 DIAGNOSIS — M9904 Segmental and somatic dysfunction of sacral region: Secondary | ICD-10-CM | POA: Diagnosis not present

## 2021-08-21 DIAGNOSIS — M9905 Segmental and somatic dysfunction of pelvic region: Secondary | ICD-10-CM | POA: Diagnosis not present

## 2021-08-21 DIAGNOSIS — M9901 Segmental and somatic dysfunction of cervical region: Secondary | ICD-10-CM | POA: Diagnosis not present

## 2021-08-21 DIAGNOSIS — M9902 Segmental and somatic dysfunction of thoracic region: Secondary | ICD-10-CM | POA: Diagnosis not present

## 2021-08-23 DIAGNOSIS — M9901 Segmental and somatic dysfunction of cervical region: Secondary | ICD-10-CM | POA: Diagnosis not present

## 2021-08-23 DIAGNOSIS — M9904 Segmental and somatic dysfunction of sacral region: Secondary | ICD-10-CM | POA: Diagnosis not present

## 2021-08-23 DIAGNOSIS — R293 Abnormal posture: Secondary | ICD-10-CM | POA: Diagnosis not present

## 2021-08-23 DIAGNOSIS — M9905 Segmental and somatic dysfunction of pelvic region: Secondary | ICD-10-CM | POA: Diagnosis not present

## 2021-08-23 DIAGNOSIS — M9902 Segmental and somatic dysfunction of thoracic region: Secondary | ICD-10-CM | POA: Diagnosis not present

## 2021-08-23 DIAGNOSIS — M4727 Other spondylosis with radiculopathy, lumbosacral region: Secondary | ICD-10-CM | POA: Diagnosis not present

## 2021-08-23 DIAGNOSIS — M5032 Other cervical disc degeneration, mid-cervical region, unspecified level: Secondary | ICD-10-CM | POA: Diagnosis not present

## 2021-08-23 DIAGNOSIS — M9903 Segmental and somatic dysfunction of lumbar region: Secondary | ICD-10-CM | POA: Diagnosis not present

## 2021-08-24 DIAGNOSIS — M9902 Segmental and somatic dysfunction of thoracic region: Secondary | ICD-10-CM | POA: Diagnosis not present

## 2021-08-24 DIAGNOSIS — M4727 Other spondylosis with radiculopathy, lumbosacral region: Secondary | ICD-10-CM | POA: Diagnosis not present

## 2021-08-24 DIAGNOSIS — M9904 Segmental and somatic dysfunction of sacral region: Secondary | ICD-10-CM | POA: Diagnosis not present

## 2021-08-24 DIAGNOSIS — M9903 Segmental and somatic dysfunction of lumbar region: Secondary | ICD-10-CM | POA: Diagnosis not present

## 2021-08-24 DIAGNOSIS — M9901 Segmental and somatic dysfunction of cervical region: Secondary | ICD-10-CM | POA: Diagnosis not present

## 2021-08-24 DIAGNOSIS — R293 Abnormal posture: Secondary | ICD-10-CM | POA: Diagnosis not present

## 2021-08-24 DIAGNOSIS — M9905 Segmental and somatic dysfunction of pelvic region: Secondary | ICD-10-CM | POA: Diagnosis not present

## 2021-08-24 DIAGNOSIS — M5032 Other cervical disc degeneration, mid-cervical region, unspecified level: Secondary | ICD-10-CM | POA: Diagnosis not present

## 2021-08-28 DIAGNOSIS — M9905 Segmental and somatic dysfunction of pelvic region: Secondary | ICD-10-CM | POA: Diagnosis not present

## 2021-08-28 DIAGNOSIS — M9903 Segmental and somatic dysfunction of lumbar region: Secondary | ICD-10-CM | POA: Diagnosis not present

## 2021-08-28 DIAGNOSIS — M9902 Segmental and somatic dysfunction of thoracic region: Secondary | ICD-10-CM | POA: Diagnosis not present

## 2021-08-28 DIAGNOSIS — M9901 Segmental and somatic dysfunction of cervical region: Secondary | ICD-10-CM | POA: Diagnosis not present

## 2021-08-28 DIAGNOSIS — M5032 Other cervical disc degeneration, mid-cervical region, unspecified level: Secondary | ICD-10-CM | POA: Diagnosis not present

## 2021-08-28 DIAGNOSIS — M4727 Other spondylosis with radiculopathy, lumbosacral region: Secondary | ICD-10-CM | POA: Diagnosis not present

## 2021-08-28 DIAGNOSIS — M9904 Segmental and somatic dysfunction of sacral region: Secondary | ICD-10-CM | POA: Diagnosis not present

## 2021-08-28 DIAGNOSIS — R293 Abnormal posture: Secondary | ICD-10-CM | POA: Diagnosis not present

## 2021-08-30 DIAGNOSIS — M9905 Segmental and somatic dysfunction of pelvic region: Secondary | ICD-10-CM | POA: Diagnosis not present

## 2021-08-30 DIAGNOSIS — R293 Abnormal posture: Secondary | ICD-10-CM | POA: Diagnosis not present

## 2021-08-30 DIAGNOSIS — M5032 Other cervical disc degeneration, mid-cervical region, unspecified level: Secondary | ICD-10-CM | POA: Diagnosis not present

## 2021-08-30 DIAGNOSIS — M4727 Other spondylosis with radiculopathy, lumbosacral region: Secondary | ICD-10-CM | POA: Diagnosis not present

## 2021-08-30 DIAGNOSIS — M9901 Segmental and somatic dysfunction of cervical region: Secondary | ICD-10-CM | POA: Diagnosis not present

## 2021-08-30 DIAGNOSIS — M9904 Segmental and somatic dysfunction of sacral region: Secondary | ICD-10-CM | POA: Diagnosis not present

## 2021-08-30 DIAGNOSIS — M9903 Segmental and somatic dysfunction of lumbar region: Secondary | ICD-10-CM | POA: Diagnosis not present

## 2021-08-30 DIAGNOSIS — M9902 Segmental and somatic dysfunction of thoracic region: Secondary | ICD-10-CM | POA: Diagnosis not present

## 2021-08-31 DIAGNOSIS — M5032 Other cervical disc degeneration, mid-cervical region, unspecified level: Secondary | ICD-10-CM | POA: Diagnosis not present

## 2021-08-31 DIAGNOSIS — R293 Abnormal posture: Secondary | ICD-10-CM | POA: Diagnosis not present

## 2021-08-31 DIAGNOSIS — M9903 Segmental and somatic dysfunction of lumbar region: Secondary | ICD-10-CM | POA: Diagnosis not present

## 2021-08-31 DIAGNOSIS — M9901 Segmental and somatic dysfunction of cervical region: Secondary | ICD-10-CM | POA: Diagnosis not present

## 2021-08-31 DIAGNOSIS — M9902 Segmental and somatic dysfunction of thoracic region: Secondary | ICD-10-CM | POA: Diagnosis not present

## 2021-08-31 DIAGNOSIS — M4727 Other spondylosis with radiculopathy, lumbosacral region: Secondary | ICD-10-CM | POA: Diagnosis not present

## 2021-08-31 DIAGNOSIS — M9904 Segmental and somatic dysfunction of sacral region: Secondary | ICD-10-CM | POA: Diagnosis not present

## 2021-08-31 DIAGNOSIS — M9905 Segmental and somatic dysfunction of pelvic region: Secondary | ICD-10-CM | POA: Diagnosis not present

## 2021-11-16 DIAGNOSIS — M1990 Unspecified osteoarthritis, unspecified site: Secondary | ICD-10-CM | POA: Diagnosis not present

## 2021-11-16 DIAGNOSIS — R197 Diarrhea, unspecified: Secondary | ICD-10-CM | POA: Diagnosis not present

## 2021-11-16 DIAGNOSIS — K649 Unspecified hemorrhoids: Secondary | ICD-10-CM | POA: Diagnosis not present

## 2021-11-16 DIAGNOSIS — M67472 Ganglion, left ankle and foot: Secondary | ICD-10-CM | POA: Diagnosis not present

## 2021-11-16 DIAGNOSIS — B351 Tinea unguium: Secondary | ICD-10-CM | POA: Diagnosis not present

## 2021-11-16 DIAGNOSIS — K219 Gastro-esophageal reflux disease without esophagitis: Secondary | ICD-10-CM | POA: Diagnosis not present

## 2021-11-16 DIAGNOSIS — R109 Unspecified abdominal pain: Secondary | ICD-10-CM | POA: Diagnosis not present

## 2021-11-16 DIAGNOSIS — Z1231 Encounter for screening mammogram for malignant neoplasm of breast: Secondary | ICD-10-CM | POA: Diagnosis not present

## 2021-11-16 DIAGNOSIS — J45909 Unspecified asthma, uncomplicated: Secondary | ICD-10-CM | POA: Diagnosis not present

## 2021-11-16 DIAGNOSIS — E785 Hyperlipidemia, unspecified: Secondary | ICD-10-CM | POA: Diagnosis not present

## 2021-11-16 DIAGNOSIS — J329 Chronic sinusitis, unspecified: Secondary | ICD-10-CM | POA: Diagnosis not present

## 2021-11-16 DIAGNOSIS — Z0189 Encounter for other specified special examinations: Secondary | ICD-10-CM | POA: Diagnosis not present

## 2021-11-16 DIAGNOSIS — M898X9 Other specified disorders of bone, unspecified site: Secondary | ICD-10-CM | POA: Diagnosis not present

## 2021-11-16 DIAGNOSIS — I700 Atherosclerosis of aorta: Secondary | ICD-10-CM | POA: Diagnosis not present

## 2021-11-16 DIAGNOSIS — Z6837 Body mass index (BMI) 37.0-37.9, adult: Secondary | ICD-10-CM | POA: Diagnosis not present

## 2021-11-16 DIAGNOSIS — K5790 Diverticulosis of intestine, part unspecified, without perforation or abscess without bleeding: Secondary | ICD-10-CM | POA: Diagnosis not present

## 2021-11-16 DIAGNOSIS — E669 Obesity, unspecified: Secondary | ICD-10-CM | POA: Diagnosis not present

## 2021-11-16 DIAGNOSIS — F419 Anxiety disorder, unspecified: Secondary | ICD-10-CM | POA: Diagnosis not present

## 2021-11-16 DIAGNOSIS — J302 Other seasonal allergic rhinitis: Secondary | ICD-10-CM | POA: Diagnosis not present

## 2021-11-16 DIAGNOSIS — R519 Headache, unspecified: Secondary | ICD-10-CM | POA: Diagnosis not present

## 2021-11-16 DIAGNOSIS — I10 Essential (primary) hypertension: Secondary | ICD-10-CM | POA: Diagnosis not present

## 2021-11-16 DIAGNOSIS — N1831 Chronic kidney disease, stage 3a: Secondary | ICD-10-CM | POA: Diagnosis not present

## 2021-11-16 DIAGNOSIS — M48061 Spinal stenosis, lumbar region without neurogenic claudication: Secondary | ICD-10-CM | POA: Diagnosis not present

## 2021-11-16 DIAGNOSIS — Z0000 Encounter for general adult medical examination without abnormal findings: Secondary | ICD-10-CM | POA: Diagnosis not present

## 2021-11-16 DIAGNOSIS — M549 Dorsalgia, unspecified: Secondary | ICD-10-CM | POA: Diagnosis not present

## 2021-12-04 DIAGNOSIS — Z1211 Encounter for screening for malignant neoplasm of colon: Secondary | ICD-10-CM | POA: Diagnosis not present

## 2022-01-15 ENCOUNTER — Other Ambulatory Visit: Payer: Self-pay | Admitting: Podiatry

## 2022-02-19 DIAGNOSIS — K219 Gastro-esophageal reflux disease without esophagitis: Secondary | ICD-10-CM | POA: Diagnosis not present

## 2022-02-19 DIAGNOSIS — J329 Chronic sinusitis, unspecified: Secondary | ICD-10-CM | POA: Diagnosis not present

## 2022-02-19 DIAGNOSIS — I10 Essential (primary) hypertension: Secondary | ICD-10-CM | POA: Diagnosis not present

## 2022-02-19 DIAGNOSIS — J302 Other seasonal allergic rhinitis: Secondary | ICD-10-CM | POA: Diagnosis not present

## 2022-02-19 DIAGNOSIS — K5900 Constipation, unspecified: Secondary | ICD-10-CM | POA: Diagnosis not present

## 2022-02-20 DIAGNOSIS — M545 Low back pain, unspecified: Secondary | ICD-10-CM | POA: Diagnosis not present

## 2022-02-20 DIAGNOSIS — M5417 Radiculopathy, lumbosacral region: Secondary | ICD-10-CM | POA: Diagnosis not present

## 2022-02-28 ENCOUNTER — Encounter (INDEPENDENT_AMBULATORY_CARE_PROVIDER_SITE_OTHER): Payer: Self-pay
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
## 2022-03-06 DIAGNOSIS — M5416 Radiculopathy, lumbar region: Secondary | ICD-10-CM | POA: Diagnosis not present

## 2022-03-08 DIAGNOSIS — I10 Essential (primary) hypertension: Secondary | ICD-10-CM | POA: Diagnosis not present

## 2022-03-08 DIAGNOSIS — J019 Acute sinusitis, unspecified: Secondary | ICD-10-CM | POA: Diagnosis not present

## 2022-03-20 DIAGNOSIS — M545 Low back pain, unspecified: Secondary | ICD-10-CM | POA: Diagnosis not present

## 2022-03-20 DIAGNOSIS — M5417 Radiculopathy, lumbosacral region: Secondary | ICD-10-CM | POA: Diagnosis not present

## 2022-03-20 DIAGNOSIS — M25562 Pain in left knee: Secondary | ICD-10-CM | POA: Diagnosis not present

## 2022-03-20 DIAGNOSIS — M25561 Pain in right knee: Secondary | ICD-10-CM | POA: Diagnosis not present

## 2022-04-03 DIAGNOSIS — J45909 Unspecified asthma, uncomplicated: Secondary | ICD-10-CM | POA: Diagnosis not present

## 2022-04-03 DIAGNOSIS — M549 Dorsalgia, unspecified: Secondary | ICD-10-CM | POA: Diagnosis not present

## 2022-04-03 DIAGNOSIS — M48061 Spinal stenosis, lumbar region without neurogenic claudication: Secondary | ICD-10-CM | POA: Diagnosis not present

## 2022-04-03 DIAGNOSIS — K219 Gastro-esophageal reflux disease without esophagitis: Secondary | ICD-10-CM | POA: Diagnosis not present

## 2022-09-30 ENCOUNTER — Emergency Department (HOSPITAL_BASED_OUTPATIENT_CLINIC_OR_DEPARTMENT_OTHER): Payer: Medicare Other

## 2022-09-30 ENCOUNTER — Emergency Department (HOSPITAL_BASED_OUTPATIENT_CLINIC_OR_DEPARTMENT_OTHER): Payer: Medicare Other | Admitting: Radiology

## 2022-09-30 ENCOUNTER — Emergency Department (HOSPITAL_BASED_OUTPATIENT_CLINIC_OR_DEPARTMENT_OTHER)
Admission: EM | Admit: 2022-09-30 | Discharge: 2022-09-30 | Disposition: A | Discharge status: Home / Self Care | Payer: Medicare Other | Attending: Emergency Medicine | Admitting: Emergency Medicine

## 2022-09-30 ENCOUNTER — Other Ambulatory Visit: Payer: Self-pay

## 2022-09-30 DIAGNOSIS — S8391XA Sprain of unspecified site of right knee, initial encounter: Secondary | ICD-10-CM | POA: Diagnosis not present

## 2022-09-30 DIAGNOSIS — J45909 Unspecified asthma, uncomplicated: Secondary | ICD-10-CM | POA: Insufficient documentation

## 2022-09-30 DIAGNOSIS — I10 Essential (primary) hypertension: Secondary | ICD-10-CM | POA: Diagnosis not present

## 2022-09-30 DIAGNOSIS — M79604 Pain in right leg: Secondary | ICD-10-CM | POA: Diagnosis present

## 2022-09-30 DIAGNOSIS — W19XXXA Unspecified fall, initial encounter: Secondary | ICD-10-CM | POA: Diagnosis not present

## 2022-09-30 DIAGNOSIS — S0003XA Contusion of scalp, initial encounter: Secondary | ICD-10-CM | POA: Diagnosis not present

## 2022-09-30 DIAGNOSIS — Z79899 Other long term (current) drug therapy: Secondary | ICD-10-CM | POA: Insufficient documentation

## 2022-09-30 MED ORDER — HYDROCODONE-ACETAMINOPHEN 5-325 MG PO TABS
1.0000 | ORAL_TABLET | Freq: Once | ORAL | Status: AC
  Administered 2022-09-30: 1 via ORAL
  Filled 2022-09-30: qty 1

## 2022-09-30 MED ORDER — HYDROCODONE-ACETAMINOPHEN 5-325 MG PO TABS: 1.0000 | ORAL_TABLET | Freq: Four times a day (QID) | ORAL | 0 refills | Status: DC | PRN

## 2022-09-30 MED ORDER — METHOCARBAMOL 500 MG PO TABS: 500.0000 mg | ORAL_TABLET | Freq: Two times a day (BID) | ORAL | 0 refills | Status: DC

## 2022-09-30 NOTE — ED Provider Notes (Signed)
[facility] Provider Note   CSN: :[id number] Arrival date & time: 09/30/22  1620     History  No chief complaint on file.   Christina Ray is a 67 y.o. female.  Patient is a 67 year old female with a history of hypertension, chronic back pain with radiculopathy, asthma who is presenting today with pain after a fall.  Patient was sitting in a chair yesterday leaning over to get something on the right when the chair flew out from underneath her causing her leg to wrap around behind her and she hit her head on a glass table.  She had no loss of consciousness and was able to stand but has been having a lot of pain in her right leg, numbness in the right toes and foot area and some headache.  She has no rib or abdominal pain.  She reports she always has back pain and it is more achy today as well.  She has no shortness of breath, vision changes, nausea or vomiting.  She has been taking Tylenol and ibuprofen around-the-clock since the fall with only minimal pain improvement.  The history is provided by the patient.       Home Medications Prior to Admission medications   Medication Sig Start Date End Date Taking? Authorizing Provider  HYDROcodone-acetaminophen (NORCO/VICODIN) 5-325 MG tablet Take 1 tablet by mouth every 6 (six) hours as needed. 09/30/22  Yes Maryl Blalock, Loree Fee, MD  methocarbamol (ROBAXIN) 500 MG tablet Take 1 tablet (500 mg total) by mouth 2 (two) times daily. 09/30/22  Yes Han Vejar, Loree Fee, MD  atenolol (TENORMIN) 50 MG tablet TAKE 1 TABLET BY MOUTH EVERY DAY 04/04/18   Lucila Maine C, DO  atorvastatin (LIPITOR) 20 MG tablet Take 20 mg by mouth daily. 09/03/19   [provider]  Calcium-Magnesium-Zinc (CAL-MAG-ZINC PO) Take 1 tablet by mouth 2 (two) times a week.    [provider]  cefadroxil (DURICEF) 500 MG capsule Take 1 capsule (500 mg total) by mouth 2 (two) times daily. 12/10/19   Swinteck, Aaron Edelman, MD   celecoxib (CELEBREX) 200 MG capsule Take 1 capsule (200 mg total) by mouth daily. 10/15/19   Swinteck, Aaron Edelman, MD  ciclopirox (PENLAC) 8 % solution Apply topically at bedtime. Apply over nail and surrounding skin. Apply daily over previous coat. After seven (7) days, may remove with alcohol and continue cycle. 05/03/21   Felipa Furnace, DPM  ciclopirox (PENLAC) 8 % solution Apply topically at bedtime. Apply over nail and surrounding skin. Apply daily over previous coat. After seven (7) days, may remove with alcohol and continue cycle. 05/03/21   Felipa Furnace, DPM  Cyanocobalamin (VITAMIN B-12) 2500 MCG SUBL Take 2,500 mcg by mouth 2 (two) times a week.    [provider]  docusate sodium (COLACE) 100 MG capsule Take 1 capsule (100 mg total) by mouth 2 (two) times daily. Patient taking differently: Take 100 mg by mouth daily as needed for moderate constipation.  10/15/19   Swinteck, Aaron Edelman, MD  doxycycline (VIBRAMYCIN) 100 MG capsule Take 1 capsule (100 mg total) by mouth 2 (two) times daily. 12/10/19   Swinteck, Aaron Edelman, MD  fluticasone (FLONASE) 50 MCG/ACT nasal spray Place 2 sprays into both nostrils daily. 10/24/17   Steve Rattler, DO  hydrochlorothiazide (HYDRODIURIL) 25 MG tablet Take 25 mg by mouth daily. 09/03/19   [provider]  hydrOXYzine (ATARAX/VISTARIL) 50 MG tablet Take 50 mg by mouth 3 (three) times daily.  [provider]  ibuprofen (ADVIL) 800 MG tablet Take 1 tablet (800 mg total) by mouth every 6 (six) hours as needed. 04/24/21   Felipa Furnace, DPM  lisinopril (ZESTRIL) 40 MG tablet Take 40 mg by mouth daily. 08/17/19   [provider]  loratadine (CLARITIN) 10 MG tablet Take 10 mg by mouth daily.    [provider]  meloxicam (MOBIC) 15 MG tablet Take 15 mg by mouth daily.    [provider]  mineral oil light external liquid Apply 1 application topically 2 (two) times daily as needed (inner ear itching.).    [provider]  montelukast (SINGULAIR) 10 MG tablet TAKE 1 TABLET BY MOUTH EVERYDAY AT BEDTIME 09/04/18   Steve Rattler, DO  Multiple Vitamin (MULTIVITAMIN WITH MINERALS) TABS tablet Take 1 tablet by mouth daily.    [provider]  ondansetron (ZOFRAN) 4 MG tablet Take 1 tablet (4 mg total) by mouth every 6 (six) hours as needed for nausea. Patient not taking: Reported on 12/07/2019 10/15/19   Rod Can, MD  oxyCODONE-acetaminophen (PERCOCET) 5-325 MG tablet Take 1 tablet by mouth every 4 (four) hours as needed for severe pain. 04/24/21   Felipa Furnace, DPM  pantoprazole (PROTONIX) 40 MG tablet Take 40 mg by mouth daily. 02/27/19   [provider]  polyethylene glycol-electrolytes (NULYTELY/GOLYTELY) 420 g solution Take 4,000 mLs by mouth as directed. 04/15/19   Milus Banister, MD  senna (SENOKOT) 8.6 MG TABS tablet Take 2 tablets (17.2 mg total) by mouth at bedtime. Patient not taking: Reported on 12/07/2019 10/15/19   Rod Can, MD  SIMPLY SALINE NA Place 1 spray into the nose 4 (four) times daily as needed (congestion.).    [provider]  tiZANidine (ZANAFLEX) 2 MG tablet Take 1 tablet (2 mg total) by mouth every 8 (eight) hours as needed for muscle spasms. 11/18/20   Varney Biles, MD  traMADol (ULTRAM) 50 MG tablet Take 50 mg by mouth every 6 (six) hours as needed.    [provider]  Turmeric 500 MG TABS Take 500 mg by mouth 2 (two) times a week.    [provider]  Vitamin D, Ergocalciferol, (DRISDOL) 1.25 MG (50000 UNIT) CAPS capsule Take 50,000 Units by mouth every Monday. 09/29/19   [provider]      Allergies    Patient has no known allergies.    Review of Systems   Review of Systems  Physical Exam Updated Vital Signs BP (!) 143/82 (BP Location: Left Arm)   Pulse (!) 56   Temp 99.1 F (37.3 C)   Resp 18   SpO2 99%  Physical Exam Vitals and nursing note reviewed.  Constitutional:      General: She is not  in acute distress.    Appearance: She is well-developed.  HENT:     Head: Normocephalic and atraumatic.     Comments: Tenderness in the occiput area but no obvious hematomas Eyes:     Pupils: Pupils are equal, round, and reactive to light.  Cardiovascular:     Rate and Rhythm: Normal rate and regular rhythm.     Pulses: Normal pulses.     Heart sounds: Normal heart sounds. No murmur heard.    No friction rub.  Pulmonary:     Effort: Pulmonary effort is normal.     Breath sounds: Normal breath sounds. No wheezing or rales.  Abdominal:     General: Bowel sounds are normal.  There is no distension.     Palpations: Abdomen is soft.     Tenderness: There is no abdominal tenderness. There is no guarding or rebound.  Musculoskeletal:        General: Tenderness present. Normal range of motion.     Cervical back: Normal range of motion and neck supple.     Right hip: Tenderness present. Normal range of motion.     Right knee: Swelling and bony tenderness present. No deformity. Normal range of motion.     Right ankle: Tenderness present over the lateral malleolus and base of 5th metatarsal. Normal range of motion.     Comments: No edema  Skin:    General: Skin is warm and dry.     Findings: No rash.  Neurological:     Mental Status: She is alert and oriented to person, place, and time.     Cranial Nerves: No cranial nerve deficit.     Comments: Subjective decrease sensation in the toes in the right foot.  Capillary refill less than 2 seconds, pulses intact.  Psychiatric:        Behavior: Behavior normal.     ED Results / Procedures / Treatments   Labs (all labs ordered are listed, but only abnormal results are displayed) Labs Reviewed - No data to display  EKG None  Radiology DG Ankle Complete Right  Result Date: 09/30/2022 CLINICAL DATA:  Fall, pain. EXAM: RIGHT ANKLE - COMPLETE 3+ VIEW COMPARISON:  None Available. FINDINGS: There is no evidence of fracture, dislocation, or  joint effusion. There is degenerative osteophyte formation over the dorsal midfoot. Plantar calcaneal spur is present. There is a well corticated density adjacent to the tip of the medial malleolus likely related to old injury. Soft tissues are unremarkable. IMPRESSION: 1. No acute fracture or dislocation. 2. Degenerative changes over the dorsal midfoot. Electronically Signed   By: Ronney Asters M.D.   On: 09/30/2022 17:36   DG Knee Complete 4 Views Right  Result Date: 09/30/2022 CLINICAL DATA:  Fall. EXAM: RIGHT KNEE - COMPLETE 4+ VIEW COMPARISON:  Chronic knee radiograph dated 07/01/2014. FINDINGS: There is no acute fracture or dislocation. The bones are mildly osteopenic. There is severe osteoarthritic changes with severe tricompartmental narrowing and spurring, progressed since the prior radiograph. There is a moderate joint effusion. The soft tissues are unremarkable. IMPRESSION: 1. No acute fracture or dislocation. 2. Moderate joint effusion. 3. Severe osteoarthritis. Electronically Signed   By: Anner Crete M.D.   On: 09/30/2022 17:32   DG Hip Unilat W or Wo Pelvis 2-3 Views Right  Result Date: 09/30/2022 CLINICAL DATA:  Fall with pain. EXAM: DG HIP (WITH OR WITHOUT PELVIS) 2-3V RIGHT COMPARISON:  None Available. FINDINGS: Frontal pelvis shows no fracture. SI joints and symphysis pubis unremarkable. AP and frog-leg lateral views of the right hip show total hip replacement. No evidence for hardware complication or periprosthetic fracture. IMPRESSION: No acute findings. Electronically Signed   By: Misty Stanley M.D.   On: 09/30/2022 17:31   CT Head Wo Contrast  Result Date: 09/30/2022 CLINICAL DATA:  Head trauma, minor (Age >= 65y) EXAM: CT HEAD WITHOUT CONTRAST TECHNIQUE: Contiguous axial images were obtained from the base of the skull through the vertex without intravenous contrast. RADIATION DOSE REDUCTION: This exam was performed according to the departmental dose-optimization program which  includes automated exposure control, adjustment of the mA and/or kV according to patient size and/or use of iterative reconstruction technique. COMPARISON:  07/01/2005 FINDINGS: Brain: No  evidence of acute infarction, hemorrhage, hydrocephalus, extra-axial collection or mass lesion/mass effect. Vascular: No hyperdense vessel or unexpected calcification. Skull: Normal. Negative for fracture or focal lesion. Sinuses/Orbits: No acute finding. Other: None. IMPRESSION: No acute intracranial abnormality. Electronically Signed   By: Davina Poke D.O.   On: 09/30/2022 17:19    Procedures Procedures    Medications Ordered in ED Medications  HYDROcodone-acetaminophen (NORCO/VICODIN) 5-325 MG per tablet 1 tablet (1 tablet Oral Given 09/30/22 1656)    ED Course/ Medical Decision Making/ A&P                             Medical Decision Making Amount and/or Complexity of Data Reviewed Radiology: ordered and independent interpretation performed. Decision-making details documented in ED Course.  Risk Prescription drug management.   Patient presenting today after a fall yesterday with ongoing pain in the right leg, numbness in her toes and headache after hitting her head.  Patient does not take any anticoagulation.  Hemodynamically stable here.  Vascularly intact but does express decreased sensation in the right toes.  Patient has a history of chronic back pain as well as lumbar radiculopathy.  Concern for possible exacerbation of her chronic back pain causing numbness to the toes related to lumbar radiculopathy versus acute muscular tear with nerve involvement versus fracture.  I have independently visualized and interpreted pt's images today.  Head CT negative for acute injury.  Hip, knee and ankle neg for acute fracture.  Pt will be treated supportively she does have an effusion at her knee which may be related to sprain or from chronic arthritis.  Patient will follow-up with her PCP if symptoms do not  improve.         Final Clinical Impression(s) / ED Diagnoses Final diagnoses:  Fall, initial encounter  Contusion of scalp, initial encounter  Sprain of right knee/leg, initial encounter    Rx / DC Orders ED Discharge Orders          Ordered    HYDROcodone-acetaminophen (NORCO/VICODIN) 5-325 MG tablet  Every 6 hours PRN        09/30/22 1755    methocarbamol (ROBAXIN) 500 MG tablet  2 times daily        09/30/22 1755              Blanchie Dessert, MD 09/30/22 1813

## 2022-09-30 NOTE — Discharge Instructions (Signed)
All the x-rays today look okay except there is some fluid on her right knee.  No broken bones or internal bleeding in the head today.

## 2022-09-30 NOTE — ED Notes (Signed)
DC papers reviewed. No questions or concerns. No signs of distress. Pt assisted to wheelchair and out to lobby. Appropriate measures for safety taken. 

## 2022-09-30 NOTE — ED Triage Notes (Signed)
Pt fell last night in her office, hitting the back of her head on the glass desk, then turned to her right side landing and hurting her right leg from hip to ankle, says the right leg bent underneath her. No LOC.

## 2022-10-02 ENCOUNTER — Ambulatory Visit: Payer: Medicare Other | Admitting: Podiatry

## 2022-10-02 DIAGNOSIS — M79671 Pain in right foot: Secondary | ICD-10-CM | POA: Diagnosis not present

## 2022-10-02 DIAGNOSIS — T148XXA Other injury of unspecified body region, initial encounter: Secondary | ICD-10-CM

## 2022-10-02 MED ORDER — OXYCODONE-ACETAMINOPHEN 5-325 MG PO TABS: 1.0000 | ORAL_TABLET | ORAL | 0 refills | Status: DC | PRN

## 2022-10-02 NOTE — Progress Notes (Signed)
Subjective:  Patient ID: Christina Ray, female    DOB: 1956/03/01,  MRN: [id number]  Chief Complaint  Patient presents with   Foot Pain    Pt stated that she fell on Saturday and went and had xrays done but she stated that her foot is numb and she can not move her toes     67 y.o. female presents with the above complaint.  Patient presents with right ankle soft tissue contusion with some neuritis.  She states that it happened after she had a fall on Saturday.  She wanted to get it evaluated.  She has not seen anyone as prior to seeing me she states that hurts and there is no bruising or ecchymosis.  She has not been immobilized.   Review of Systems: Negative except as noted in the HPI. Denies N/V/F/Ch.  Past Medical History:  Diagnosis Date   Allergy    Anxiety    Arthritis    Asthma    Chronic back pain    Complication of anesthesia    Diverticulitis    GERD (gastroesophageal reflux disease)    Hyperlipidemia    Hypertension    PONV (postoperative nausea and vomiting)    Vertigo     Current Outpatient Medications:    oxyCODONE-acetaminophen (PERCOCET) 5-325 MG tablet, Take 1 tablet by mouth every 4 (four) hours as needed for severe pain., Disp: 30 tablet, Rfl: 0   atenolol (TENORMIN) 50 MG tablet, TAKE 1 TABLET BY MOUTH EVERY DAY, Disp: 90 tablet, Rfl: 2   atorvastatin (LIPITOR) 20 MG tablet, Take 20 mg by mouth daily., Disp: , Rfl:    Calcium-Magnesium-Zinc (CAL-MAG-ZINC PO), Take 1 tablet by mouth 2 (two) times a week., Disp: , Rfl:    cefadroxil (DURICEF) 500 MG capsule, Take 1 capsule (500 mg total) by mouth 2 (two) times daily., Disp: 28 capsule, Rfl: 0   celecoxib (CELEBREX) 200 MG capsule, Take 1 capsule (200 mg total) by mouth daily., Disp: 30 capsule, Rfl: 2   ciclopirox (PENLAC) 8 % solution, Apply topically at bedtime. Apply over nail and surrounding skin. Apply daily over previous coat. After seven (7) days, may remove with alcohol and continue cycle., Disp: 6.6  mL, Rfl: 0   ciclopirox (PENLAC) 8 % solution, Apply topically at bedtime. Apply over nail and surrounding skin. Apply daily over previous coat. After seven (7) days, may remove with alcohol and continue cycle., Disp: 6.6 mL, Rfl: 0   Cyanocobalamin (VITAMIN B-12) 2500 MCG SUBL, Take 2,500 mcg by mouth 2 (two) times a week., Disp: , Rfl:    docusate sodium (COLACE) 100 MG capsule, Take 1 capsule (100 mg total) by mouth 2 (two) times daily. (Patient taking differently: Take 100 mg by mouth daily as needed for moderate constipation. ), Disp: 60 capsule, Rfl: 0   doxycycline (VIBRAMYCIN) 100 MG capsule, Take 1 capsule (100 mg total) by mouth 2 (two) times daily., Disp: 28 capsule, Rfl: 0   fluticasone (FLONASE) 50 MCG/ACT nasal spray, Place 2 sprays into both nostrils daily., Disp: 16 g, Rfl: 6   hydrochlorothiazide (HYDRODIURIL) 25 MG tablet, Take 25 mg by mouth daily., Disp: , Rfl:    HYDROcodone-acetaminophen (NORCO/VICODIN) 5-325 MG tablet, Take 1 tablet by mouth every 6 (six) hours as needed., Disp: 10 tablet, Rfl: 0   hydrOXYzine (ATARAX/VISTARIL) 50 MG tablet, Take 50 mg by mouth 3 (three) times daily., Disp: , Rfl:    ibuprofen (ADVIL) 800 MG tablet, Take 1 tablet (800 mg total) by  mouth every 6 (six) hours as needed., Disp: 60 tablet, Rfl: 1   lisinopril (ZESTRIL) 40 MG tablet, Take 40 mg by mouth daily., Disp: , Rfl:    loratadine (CLARITIN) 10 MG tablet, Take 10 mg by mouth daily., Disp: , Rfl:    meloxicam (MOBIC) 15 MG tablet, Take 15 mg by mouth daily., Disp: , Rfl:    methocarbamol (ROBAXIN) 500 MG tablet, Take 1 tablet (500 mg total) by mouth 2 (two) times daily., Disp: 20 tablet, Rfl: 0   mineral oil light external liquid, Apply 1 application topically 2 (two) times daily as needed (inner ear itching.)., Disp: , Rfl:    montelukast (SINGULAIR) 10 MG tablet, TAKE 1 TABLET BY MOUTH EVERYDAY AT BEDTIME, Disp: 90 tablet, Rfl: 1   Multiple Vitamin (MULTIVITAMIN WITH MINERALS) TABS tablet,  Take 1 tablet by mouth daily., Disp: , Rfl:    ondansetron (ZOFRAN) 4 MG tablet, Take 1 tablet (4 mg total) by mouth every 6 (six) hours as needed for nausea. (Patient not taking: Reported on 12/07/2019), Disp: 20 tablet, Rfl: 0   oxyCODONE-acetaminophen (PERCOCET) 5-325 MG tablet, Take 1 tablet by mouth every 4 (four) hours as needed for severe pain., Disp: 30 tablet, Rfl: 0   pantoprazole (PROTONIX) 40 MG tablet, Take 40 mg by mouth daily., Disp: , Rfl:    polyethylene glycol-electrolytes (NULYTELY/GOLYTELY) 420 g solution, Take 4,000 mLs by mouth as directed., Disp: 4000 mL, Rfl: 0   senna (SENOKOT) 8.6 MG TABS tablet, Take 2 tablets (17.2 mg total) by mouth at bedtime. (Patient not taking: Reported on 12/07/2019), Disp: 60 tablet, Rfl: 0   SIMPLY SALINE NA, Place 1 spray into the nose 4 (four) times daily as needed (congestion.)., Disp: , Rfl:    tiZANidine (ZANAFLEX) 2 MG tablet, Take 1 tablet (2 mg total) by mouth every 8 (eight) hours as needed for muscle spasms., Disp: 21 tablet, Rfl: 0   traMADol (ULTRAM) 50 MG tablet, Take 50 mg by mouth every 6 (six) hours as needed., Disp: , Rfl:    Turmeric 500 MG TABS, Take 500 mg by mouth 2 (two) times a week., Disp: , Rfl:    Vitamin D, Ergocalciferol, (DRISDOL) 1.25 MG (50000 UNIT) CAPS capsule, Take 50,000 Units by mouth every Monday., Disp: , Rfl:   Social History   Tobacco Use  Smoking Status Former   Types: Cigarettes   Quit date: 1989   Years since quitting: 35.2  Smokeless Tobacco Never    No Known Allergies Objective:  There were no vitals filed for this visit. There is no height or weight on file to calculate BMI. Constitutional Well developed. Well nourished.  Vascular Dorsalis pedis pulses palpable bilaterally. Posterior tibial pulses palpable bilaterally. Capillary refill normal to all digits.  No cyanosis or clubbing noted. Pedal hair growth normal.  Neurologic Normal speech. Oriented to person, place, and time. Epicritic  sensation to light touch grossly present bilaterally.  Dermatologic Nails well groomed and normal in appearance. No open wounds. No skin lesions.  Orthopedic: Generalized pain noted to the dorsal ankle and dorsal foot.  No localized pain noted.  No ecchymosis or bruising noted.   Radiographs: None Assessment:   1. Contusion of soft tissue    Plan:  Patient was evaluated and treated and all questions answered.  Right generalized soft tissue contusion ankle dorsal foot -All questions and concerns were discussed with the patient in extensive detail.  Given the amount of soft tissue contusion pain she is having she  will benefit from cam boot immobilization to allow the soft tissue structure to heal appropriately.  She agrees with the plan like to proceed with cam boot immobilization -.  Cam boot was dispensed -Percocet was given for pain control.  No follow-ups on file.

## 2022-10-03 ENCOUNTER — Emergency Department (HOSPITAL_COMMUNITY): Payer: Medicare Other

## 2022-10-03 ENCOUNTER — Emergency Department (HOSPITAL_COMMUNITY)
Admission: EM | Admit: 2022-10-03 | Discharge: 2022-10-04 | Disposition: A | Discharge status: Home / Self Care | Payer: Medicare Other | Attending: Emergency Medicine | Admitting: Emergency Medicine

## 2022-10-03 ENCOUNTER — Other Ambulatory Visit: Payer: Self-pay

## 2022-10-03 ENCOUNTER — Encounter (HOSPITAL_COMMUNITY): Payer: Self-pay

## 2022-10-03 DIAGNOSIS — M79671 Pain in right foot: Secondary | ICD-10-CM

## 2022-10-03 DIAGNOSIS — W07XXXA Fall from chair, initial encounter: Secondary | ICD-10-CM | POA: Diagnosis not present

## 2022-10-03 DIAGNOSIS — J45909 Unspecified asthma, uncomplicated: Secondary | ICD-10-CM | POA: Diagnosis not present

## 2022-10-03 DIAGNOSIS — Z79899 Other long term (current) drug therapy: Secondary | ICD-10-CM | POA: Insufficient documentation

## 2022-10-03 DIAGNOSIS — I10 Essential (primary) hypertension: Secondary | ICD-10-CM | POA: Diagnosis not present

## 2022-10-03 DIAGNOSIS — Z7951 Long term (current) use of inhaled steroids: Secondary | ICD-10-CM | POA: Diagnosis not present

## 2022-10-03 NOTE — ED Triage Notes (Signed)
Arrives EMS from home with ongoing right foot pain after her fall this past Sunday.   Denies any new injuries. Pedal pulses present and strong.   Has a boot in place on arrival from [facility]. Follow up appt 3/26.

## 2022-10-03 NOTE — ED Provider Triage Note (Signed)
Emergency Medicine Provider Triage Evaluation Note  Christina Ray , Ray 68 y.o. female  was evaluated in triage.  Pt complains of right foot pain onset 09/30/22. Denies any new injury, trauma, fall.  Notes that she is still having persistent pain after her initial fall on 09/30/2022.  She notes that she has not been able to walk on her foot secondary to the pain.  Per patient chart review: Patient was evaluated by podiatry on 10/02/2022 for follow-up of her symptoms.  At that time she was given Ray cam walker boot as well as Percocet for pain control.  Review of Systems  Positive:  Negative:   Physical Exam  BP (!) 141/79 (BP Location: Left Arm)   Pulse (!) 57   Temp 99 F (37.2 C) (Oral)   Resp 18   SpO2 96%  Gen:   Awake, no distress   Resp:  Normal effort  MSK:   Moves extremities without difficulty  Other:  CAM Walker boot in place.  No overlying skin changes noted to the distal dorsum of the right foot.  No appreciable TTP noted to dorsum of right foot.  Capillary refill less than 2 seconds.  Neurovascular intact.  Patient able to flex and extend foot without difficulty.  Medical Decision Making  Medically screening exam initiated at 11:53 PM.  Appropriate orders placed.  Christina Ray was informed that the remainder of the evaluation will be completed by another provider, this initial triage assessment does not replace that evaluation, and the importance of remaining in the ED until their evaluation is complete.  Work-up initiated.    Christina Shetley A, PA-C 10/03/22 2354

## 2022-10-04 MED ORDER — ONDANSETRON 4 MG PO TBDP: 4.0000 mg | ORAL_TABLET | Freq: Once | ORAL | Status: DC | PRN

## 2022-10-04 NOTE — Discharge Instructions (Signed)
Lab or imaging was reassuring, given a postop shoe please wear during the day may take it off at nighttime.  He has crutches that were provided to you.  Recommend using the hydrocodone that were prescribed you these are not as strong as the oxycodone's I can make you sick.  Please follow-up with orthopedics for further evaluation  Come back to the emergency department if you develop chest pain, shortness of breath, severe abdominal pain, uncontrolled nausea, vomiting, diarrhea.

## 2022-10-04 NOTE — ED Notes (Signed)
Calls out for dizziness and near syncope feeling.   Vomiting in triage room on reassessment. Says she took one of her oxycodone while in waiting room.

## 2022-10-04 NOTE — ED Provider Notes (Signed)
[place] EMERGENCY DEPARTMENT AT [facility] Provider Note   CSN: [id number] Arrival date & time: 10/03/22  2335     History  Chief Complaint  Patient presents with   Foot Pain    Christina Ray is a 67 y.o. female.  HPI   Patient with medical history including hypertension, asthma, anxiety, hyperlipidemia, and coming in with right foot pain, states pain started on Saturday, states that she fell off her chair and landed funny, since since that she has worsening pain in her right foot, mainly on the top and bottom of her foot hurts mainly with ambulation, pain does not radiate, no associated paresthesia or weakness moving into her toes, states went to the ER where they checked her leg did not check her foot, she then went to her podiatrist who placed her in a postop shoe.  States that she was taking pain medication without much relief.  She has no other complaints.  I reviewed patient's chart was seen in the emergency department on 3/10, CT head was obtained which was negative, x-ray of the hip knee and ankle all of which were negative for acute findings.  Home Medications Prior to Admission medications   Medication Sig Start Date End Date Taking? Authorizing Provider  atenolol (TENORMIN) 50 MG tablet TAKE 1 TABLET BY MOUTH EVERY DAY 04/04/18   Lucila Maine C, DO  atorvastatin (LIPITOR) 20 MG tablet Take 20 mg by mouth daily. 09/03/19   [provider]  Calcium-Magnesium-Zinc (CAL-MAG-ZINC PO) Take 1 tablet by mouth 2 (two) times a week.    [provider]  cefadroxil (DURICEF) 500 MG capsule Take 1 capsule (500 mg total) by mouth 2 (two) times daily. 12/10/19   Swinteck, Aaron Edelman, MD  celecoxib (CELEBREX) 200 MG capsule Take 1 capsule (200 mg total) by mouth daily. 10/15/19   Swinteck, Aaron Edelman, MD  ciclopirox (PENLAC) 8 % solution Apply topically at bedtime. Apply over nail and surrounding skin. Apply daily over previous coat. After seven (7) days, may remove  with alcohol and continue cycle. 05/03/21   Felipa Furnace, DPM  ciclopirox (PENLAC) 8 % solution Apply topically at bedtime. Apply over nail and surrounding skin. Apply daily over previous coat. After seven (7) days, may remove with alcohol and continue cycle. 05/03/21   Felipa Furnace, DPM  Cyanocobalamin (VITAMIN B-12) 2500 MCG SUBL Take 2,500 mcg by mouth 2 (two) times a week.    [provider]  docusate sodium (COLACE) 100 MG capsule Take 1 capsule (100 mg total) by mouth 2 (two) times daily. Patient taking differently: Take 100 mg by mouth daily as needed for moderate constipation.  10/15/19   Swinteck, Aaron Edelman, MD  doxycycline (VIBRAMYCIN) 100 MG capsule Take 1 capsule (100 mg total) by mouth 2 (two) times daily. 12/10/19   Swinteck, Aaron Edelman, MD  fluticasone (FLONASE) 50 MCG/ACT nasal spray Place 2 sprays into both nostrils daily. 10/24/17   Steve Rattler, DO  hydrochlorothiazide (HYDRODIURIL) 25 MG tablet Take 25 mg by mouth daily. 09/03/19   [provider]  HYDROcodone-acetaminophen (NORCO/VICODIN) 5-325 MG tablet Take 1 tablet by mouth every 6 (six) hours as needed. 09/30/22   Blanchie Dessert, MD  hydrOXYzine (ATARAX/VISTARIL) 50 MG tablet Take 50 mg by mouth 3 (three) times daily.    [provider]  ibuprofen (ADVIL) 800 MG tablet Take 1 tablet (800 mg total) by mouth every 6 (six) hours as needed. 04/24/21   Felipa Furnace, DPM  lisinopril (ZESTRIL)  40 MG tablet Take 40 mg by mouth daily. 08/17/19   [provider]  loratadine (CLARITIN) 10 MG tablet Take 10 mg by mouth daily.    [provider]  meloxicam (MOBIC) 15 MG tablet Take 15 mg by mouth daily.    [provider]  methocarbamol (ROBAXIN) 500 MG tablet Take 1 tablet (500 mg total) by mouth 2 (two) times daily. 09/30/22   Blanchie Dessert, MD  mineral oil light external liquid Apply 1 application topically 2 (two) times daily as needed (inner ear itching.).    [provider]  montelukast (SINGULAIR) 10 MG tablet TAKE 1 TABLET BY MOUTH EVERYDAY AT BEDTIME 09/04/18   Steve Rattler, DO  Multiple Vitamin (MULTIVITAMIN WITH MINERALS) TABS tablet Take 1 tablet by mouth daily.    [provider]  ondansetron (ZOFRAN) 4 MG tablet Take 1 tablet (4 mg total) by mouth every 6 (six) hours as needed for nausea. Patient not taking: Reported on 12/07/2019 10/15/19   Rod Can, MD  oxyCODONE-acetaminophen (PERCOCET) 5-325 MG tablet Take 1 tablet by mouth every 4 (four) hours as needed for severe pain. 04/24/21   Felipa Furnace, DPM  oxyCODONE-acetaminophen (PERCOCET) 5-325 MG tablet Take 1 tablet by mouth every 4 (four) hours as needed for severe pain. 10/02/22   Felipa Furnace, DPM  pantoprazole (PROTONIX) 40 MG tablet Take 40 mg by mouth daily. 02/27/19   [provider]  polyethylene glycol-electrolytes (NULYTELY/GOLYTELY) 420 g solution Take 4,000 mLs by mouth as directed. 04/15/19   Milus Banister, MD  senna (SENOKOT) 8.6 MG TABS tablet Take 2 tablets (17.2 mg total) by mouth at bedtime. Patient not taking: Reported on 12/07/2019 10/15/19   Rod Can, MD  SIMPLY SALINE NA Place 1 spray into the nose 4 (four) times daily as needed (congestion.).    [provider]  tiZANidine (ZANAFLEX) 2 MG tablet Take 1 tablet (2 mg total) by mouth every 8 (eight) hours as needed for muscle spasms. 11/18/20   Varney Biles, MD  traMADol (ULTRAM) 50 MG tablet Take 50 mg by mouth every 6 (six) hours as needed.    [provider]  Turmeric 500 MG TABS Take 500 mg by mouth 2 (two) times a week.    [provider]  Vitamin D, Ergocalciferol, (DRISDOL) 1.25 MG (50000 UNIT) CAPS capsule Take 50,000 Units by mouth every Monday. 09/29/19   [provider]      Allergies    Patient has no known allergies.    Review of Systems   Review of Systems  Constitutional:  Negative for chills and fever.  Respiratory:  Negative for shortness of  breath.   Cardiovascular:  Negative for chest pain.  Gastrointestinal:  Negative for abdominal pain.  Musculoskeletal:        Right foot pain  Neurological:  Negative for headaches.    Physical Exam Updated Vital Signs BP (!) 141/79 (BP Location: Left Arm)   Pulse (!) 57   Temp 99 F (37.2 C) (Oral)   Resp 18   Ht '5\' 11"'$  (1.803 m)   Wt 127 kg   SpO2 96%   BMI 39.05 kg/m  Physical Exam Vitals and nursing note reviewed.  Constitutional:      General: She is not in acute distress.    Appearance: She is not ill-appearing.  HENT:     Head: Normocephalic and atraumatic.     Nose: No congestion.  Eyes:     Conjunctiva/sclera:  Conjunctivae normal.  Cardiovascular:     Rate and Rhythm: Normal rate and regular rhythm.     Pulses: Normal pulses.  Pulmonary:     Effort: Pulmonary effort is normal.  Musculoskeletal:     Comments: Focused exam of the right leg reveals no acute abnormalities, no unilateral leg swelling, no deformities present, able to move her toes ankle knee and hip, sensation intact to light touch, she has 2+ dorsal pedal pulses as well as posterior dorsalis pulses, warm to the touch, patient had no tenderness during my examination.  Skin:    General: Skin is warm and dry.  Neurological:     Mental Status: She is alert.  Psychiatric:        Mood and Affect: Mood normal.     ED Results / Procedures / Treatments   Labs (all labs ordered are listed, but only abnormal results are displayed) Labs Reviewed - No data to display  EKG None  Radiology DG Foot Complete Right  Result Date: 10/04/2022 CLINICAL DATA:  Right foot pain after fall this past Sunday EXAM: RIGHT FOOT COMPLETE - 3+ VIEW COMPARISON:  None Available. FINDINGS: No acute fracture or dislocation. Chronic fracture or degenerative osteophyte of the lateral base of the first toe proximal phalanx. Mild degenerative changes midfoot. Plantar calcaneal spur. IMPRESSION: No acute fracture or dislocation  Electronically Signed   By: Placido Sou M.D.   On: 10/04/2022 00:19    Procedures Procedures    Medications Ordered in ED Medications  ondansetron (ZOFRAN-ODT) disintegrating tablet 4 mg (has no administration in time range)    ED Course/ Medical Decision Making/ A&P                             Medical Decision Making  This patient presents to the ED for concern of right foot pain, this involves an extensive number of treatment options, and is a complaint that carries with it a high risk of complications and morbidity.  The differential diagnosis includes DVT, ischemia, fracture,    Additional history obtained:  Additional history obtained from N/A External records from outside source obtained and reviewed including podiatry notes   Co morbidities that complicate the patient evaluation  N/A  Social Determinants of Health:  N/A    Lab Tests:  I Ordered, and personally interpreted labs.  The pertinent results include: N/A   Imaging Studies ordered:  I ordered imaging studies including x-ray of the right foot I independently visualized and interpreted imaging which showed negative acute findings I agree with the radiologist interpretation   Cardiac Monitoring:  The patient was maintained on a cardiac monitor.  I personally viewed and interpreted the cardiac monitored which showed an underlying rhythm of: N/A   Medicines ordered and prescription drug management:  I ordered medication including N/A I have reviewed the patients home medicines and have made adjustments as needed  Critical Interventions:  N/A   Reevaluation:  Presents with right foot pain, triage obtain imaging which I personally viewed unremarkable, benign physical exam, agreement discharge at this time.  Consultations Obtained:  N/A    Test Considered:  N/A    Rule out I have low suspicion for septic arthritis as patient denies IV drug use, skin exam was performed no  erythematous, edematous, warm joints noted on exam, no new heart murmur heard on exam.  Low suspicion for fracture or dislocation as x-ray does not feel any significant findings.  I doubt Achilles tendon rupture she is able to flex and extend the ankle without difficulty and there is no tenderness at the insertion of the Achilles tendon.  I doubt ischemic leg as it is warm to the touch, good pulses, good capillary refill, sensation tact light touch.  I doubt DVT as she has no calf tenderness no palpable cords no real leg swelling.  low suspicion for compartment syndrome as area was palpated it was soft to the touch, neurovascular fully intact.     Dispostion and problem list  After consideration of the diagnostic results and the patients response to treatment, I feel that the patent would benefit from discharge.  Right foot pain-likely muscular in nature, patient endorses that the current boot is too heavy, possible this is causing worsening pain, will change to a postop shoe, provided with a crutch, follow-up with orthopedics for further evaluation.            Final Clinical Impression(s) / ED Diagnoses Final diagnoses:  Foot pain, right    Rx / DC Orders ED Discharge Orders     None         Marcello Fennel, PA-C 10/04/22 0353    Shanon Rosser, MD 10/04/22 [phone]

## 2022-11-05 ENCOUNTER — Telehealth: Payer: Self-pay | Admitting: *Deleted

## 2022-11-05 NOTE — Telephone Encounter (Signed)
Patient is calling because her right foot is still swollen and painful,has not gotten better but worse.she has a f/u appointment 11/14/22

## 2022-11-06 MED ORDER — METHYLPREDNISOLONE 4 MG PO TBPK: ORAL_TABLET | ORAL | 0 refills | Status: DC

## 2022-11-06 NOTE — Telephone Encounter (Signed)
Patient has been updated that medication has been sent thru voice message.

## 2022-11-14 ENCOUNTER — Ambulatory Visit: Payer: Medicare Other | Admitting: Podiatry

## 2022-11-14 DIAGNOSIS — M778 Other enthesopathies, not elsewhere classified: Secondary | ICD-10-CM

## 2022-11-14 MED ORDER — METHYLPREDNISOLONE 4 MG PO TBPK: ORAL_TABLET | ORAL | 0 refills | Status: DC

## 2022-11-14 MED ORDER — MELOXICAM 15 MG PO TABS: 15.0000 mg | ORAL_TABLET | Freq: Every day | ORAL | 0 refills | Status: AC

## 2022-11-14 NOTE — Progress Notes (Signed)
Subjective:  Patient ID: Christina Ray, female    DOB: 05/11/1956,  MRN: [id number]  Chief Complaint  Patient presents with   Contusion of soft tissue    Pt stated that she is still having some pain and discomfort     67 y.o. female presents with the above complaint.  Patient presents with pain now localized to the dorsal midfoot.  Patient states is causing some issues.  It has improved since she came to see me last time.  She would like to know if she can do a steroid injection she denies any other acute complaints   Review of Systems: Negative except as noted in the HPI. Denies N/V/F/Ch.  Past Medical History:  Diagnosis Date   Allergy    Anxiety    Arthritis    Asthma    Chronic back pain    Complication of anesthesia    Diverticulitis    GERD (gastroesophageal reflux disease)    Hyperlipidemia    Hypertension    PONV (postoperative nausea and vomiting)    Vertigo     Current Outpatient Medications:    atenolol (TENORMIN) 50 MG tablet, TAKE 1 TABLET BY MOUTH EVERY DAY, Disp: 90 tablet, Rfl: 2   atorvastatin (LIPITOR) 20 MG tablet, Take 20 mg by mouth daily., Disp: , Rfl:    Calcium-Magnesium-Zinc (CAL-MAG-ZINC PO), Take 1 tablet by mouth 2 (two) times a week., Disp: , Rfl:    cefadroxil (DURICEF) 500 MG capsule, Take 1 capsule (500 mg total) by mouth 2 (two) times daily., Disp: 28 capsule, Rfl: 0   celecoxib (CELEBREX) 200 MG capsule, Take 1 capsule (200 mg total) by mouth daily., Disp: 30 capsule, Rfl: 2   ciclopirox (PENLAC) 8 % solution, Apply topically at bedtime. Apply over nail and surrounding skin. Apply daily over previous coat. After seven (7) days, may remove with alcohol and continue cycle., Disp: 6.6 mL, Rfl: 0   ciclopirox (PENLAC) 8 % solution, Apply topically at bedtime. Apply over nail and surrounding skin. Apply daily over previous coat. After seven (7) days, may remove with alcohol and continue cycle., Disp: 6.6 mL, Rfl: 0   Cyanocobalamin (VITAMIN B-12)  2500 MCG SUBL, Take 2,500 mcg by mouth 2 (two) times a week., Disp: , Rfl:    docusate sodium (COLACE) 100 MG capsule, Take 1 capsule (100 mg total) by mouth 2 (two) times daily. (Patient taking differently: Take 100 mg by mouth daily as needed for moderate constipation. ), Disp: 60 capsule, Rfl: 0   doxycycline (VIBRAMYCIN) 100 MG capsule, Take 1 capsule (100 mg total) by mouth 2 (two) times daily., Disp: 28 capsule, Rfl: 0   fluticasone (FLONASE) 50 MCG/ACT nasal spray, Place 2 sprays into both nostrils daily., Disp: 16 g, Rfl: 6   hydrochlorothiazide (HYDRODIURIL) 25 MG tablet, Take 25 mg by mouth daily., Disp: , Rfl:    HYDROcodone-acetaminophen (NORCO/VICODIN) 5-325 MG tablet, Take 1 tablet by mouth every 6 (six) hours as needed., Disp: 10 tablet, Rfl: 0   hydrOXYzine (ATARAX/VISTARIL) 50 MG tablet, Take 50 mg by mouth 3 (three) times daily., Disp: , Rfl:    ibuprofen (ADVIL) 800 MG tablet, Take 1 tablet (800 mg total) by mouth every 6 (six) hours as needed., Disp: 60 tablet, Rfl: 1   lisinopril (ZESTRIL) 40 MG tablet, Take 40 mg by mouth daily., Disp: , Rfl:    loratadine (CLARITIN) 10 MG tablet, Take 10 mg by mouth daily., Disp: , Rfl:    meloxicam (MOBIC) 15 MG  tablet, Take 1 tablet (15 mg total) by mouth daily., Disp: 30 tablet, Rfl: 0   methocarbamol (ROBAXIN) 500 MG tablet, Take 1 tablet (500 mg total) by mouth 2 (two) times daily., Disp: 20 tablet, Rfl: 0   methylPREDNISolone (MEDROL DOSEPAK) 4 MG TBPK tablet, Take as directed, Disp: 21 each, Rfl: 0   mineral oil light external liquid, Apply 1 application topically 2 (two) times daily as needed (inner ear itching.)., Disp: , Rfl:    montelukast (SINGULAIR) 10 MG tablet, TAKE 1 TABLET BY MOUTH EVERYDAY AT BEDTIME, Disp: 90 tablet, Rfl: 1   Multiple Vitamin (MULTIVITAMIN WITH MINERALS) TABS tablet, Take 1 tablet by mouth daily., Disp: , Rfl:    ondansetron (ZOFRAN) 4 MG tablet, Take 1 tablet (4 mg total) by mouth every 6 (six) hours as  needed for nausea. (Patient not taking: Reported on 12/07/2019), Disp: 20 tablet, Rfl: 0   oxyCODONE-acetaminophen (PERCOCET) 5-325 MG tablet, Take 1 tablet by mouth every 4 (four) hours as needed for severe pain., Disp: 30 tablet, Rfl: 0   oxyCODONE-acetaminophen (PERCOCET) 5-325 MG tablet, Take 1 tablet by mouth every 4 (four) hours as needed for severe pain., Disp: 30 tablet, Rfl: 0   pantoprazole (PROTONIX) 40 MG tablet, Take 40 mg by mouth daily., Disp: , Rfl:    polyethylene glycol-electrolytes (NULYTELY/GOLYTELY) 420 g solution, Take 4,000 mLs by mouth as directed., Disp: 4000 mL, Rfl: 0   senna (SENOKOT) 8.6 MG TABS tablet, Take 2 tablets (17.2 mg total) by mouth at bedtime. (Patient not taking: Reported on 12/07/2019), Disp: 60 tablet, Rfl: 0   SIMPLY SALINE NA, Place 1 spray into the nose 4 (four) times daily as needed (congestion.)., Disp: , Rfl:    tiZANidine (ZANAFLEX) 2 MG tablet, Take 1 tablet (2 mg total) by mouth every 8 (eight) hours as needed for muscle spasms., Disp: 21 tablet, Rfl: 0   traMADol (ULTRAM) 50 MG tablet, Take 50 mg by mouth every 6 (six) hours as needed., Disp: , Rfl:    Turmeric 500 MG TABS, Take 500 mg by mouth 2 (two) times a week., Disp: , Rfl:    Vitamin D, Ergocalciferol, (DRISDOL) 1.25 MG (50000 UNIT) CAPS capsule, Take 50,000 Units by mouth every Monday., Disp: , Rfl:   Social History   Tobacco Use  Smoking Status Former   Types: Cigarettes   Quit date: 1989   Years since quitting: 35.3  Smokeless Tobacco Never    No Known Allergies Objective:  There were no vitals filed for this visit. There is no height or weight on file to calculate BMI. Constitutional Well developed. Well nourished.  Vascular Dorsalis pedis pulses palpable bilaterally. Posterior tibial pulses palpable bilaterally. Capillary refill normal to all digits.  No cyanosis or clubbing noted. Pedal hair growth normal.  Neurologic Normal speech. Oriented to person, place, and  time. Epicritic sensation to light touch grossly present bilaterally.  Dermatologic Nails well groomed and normal in appearance. No open wounds. No skin lesions.  Orthopedic: Pain on palpation dorsal midfoot.  Clinically able to appreciate some underlying arthritis.  No gross deformity noted.  No open wounds or lesion   Radiographs: None Assessment:   1. Capsulitis of right foot    Plan:  Patient was evaluated and treated and all questions answered.  Right midfoot capsulitis -All questions and concerns were discussed with the patient extensive detail given the amount of pain that she is having she will benefit from steroid injection of decrease inflammatory component associate with  pain.  Patient agrees with plan like to proceed with steroid injection -A steroid injection was performed at right midfoot dorsal using 1% plain Lidocaine and 10 mg of Kenalog. This was well tolerated.   No follow-ups on file.

## 2022-12-12 ENCOUNTER — Other Ambulatory Visit: Payer: Self-pay | Admitting: Podiatry

## 2022-12-26 ENCOUNTER — Ambulatory Visit: Payer: Medicare Other | Admitting: Podiatry

## 2022-12-26 DIAGNOSIS — M778 Other enthesopathies, not elsewhere classified: Secondary | ICD-10-CM

## 2022-12-26 MED ORDER — MELOXICAM 15 MG PO TABS: 15.0000 mg | ORAL_TABLET | Freq: Every day | ORAL | 0 refills | Status: DC

## 2022-12-26 NOTE — Progress Notes (Signed)
Subjective:  Patient ID: Christina Ray, female    DOB: Oct 29, 1955,  MRN: [id number]  Chief Complaint  Patient presents with   Foot Pain    Capsulitis of right foot    67 y.o. female presents with the above complaint.  Patient presents with right generalized weakness/fatigue extensor tendon.  Patient states that she is not able to wiggle her toes.  She feels weakness in the right anterior leg.  She wanted to discuss treatment options for she has not done physical therapy.  She denies any other acute complaints pain scale is 2 out of 10   Review of Systems: Negative except as noted in the HPI. Denies N/V/F/Ch.  Past Medical History:  Diagnosis Date   Allergy    Anxiety    Arthritis    Asthma    Chronic back pain    Complication of anesthesia    Diverticulitis    GERD (gastroesophageal reflux disease)    Hyperlipidemia    Hypertension    PONV (postoperative nausea and vomiting)    Vertigo     Current Outpatient Medications:    meloxicam (MOBIC) 15 MG tablet, Take 1 tablet (15 mg total) by mouth daily., Disp: 30 tablet, Rfl: 0   atenolol (TENORMIN) 50 MG tablet, TAKE 1 TABLET BY MOUTH EVERY DAY, Disp: 90 tablet, Rfl: 2   atorvastatin (LIPITOR) 20 MG tablet, Take 20 mg by mouth daily., Disp: , Rfl:    Calcium-Magnesium-Zinc (CAL-MAG-ZINC PO), Take 1 tablet by mouth 2 (two) times a week., Disp: , Rfl:    cefadroxil (DURICEF) 500 MG capsule, Take 1 capsule (500 mg total) by mouth 2 (two) times daily., Disp: 28 capsule, Rfl: 0   celecoxib (CELEBREX) 200 MG capsule, Take 1 capsule (200 mg total) by mouth daily., Disp: 30 capsule, Rfl: 2   ciclopirox (PENLAC) 8 % solution, Apply topically at bedtime. Apply over nail and surrounding skin. Apply daily over previous coat. After seven (7) days, may remove with alcohol and continue cycle., Disp: 6.6 mL, Rfl: 0   ciclopirox (PENLAC) 8 % solution, Apply topically at bedtime. Apply over nail and surrounding skin. Apply daily over previous  coat. After seven (7) days, may remove with alcohol and continue cycle., Disp: 6.6 mL, Rfl: 0   Cyanocobalamin (VITAMIN B-12) 2500 MCG SUBL, Take 2,500 mcg by mouth 2 (two) times a week., Disp: , Rfl:    docusate sodium (COLACE) 100 MG capsule, Take 1 capsule (100 mg total) by mouth 2 (two) times daily. (Patient taking differently: Take 100 mg by mouth daily as needed for moderate constipation. ), Disp: 60 capsule, Rfl: 0   doxycycline (VIBRAMYCIN) 100 MG capsule, Take 1 capsule (100 mg total) by mouth 2 (two) times daily., Disp: 28 capsule, Rfl: 0   fluticasone (FLONASE) 50 MCG/ACT nasal spray, Place 2 sprays into both nostrils daily., Disp: 16 g, Rfl: 6   hydrochlorothiazide (HYDRODIURIL) 25 MG tablet, Take 25 mg by mouth daily., Disp: , Rfl:    HYDROcodone-acetaminophen (NORCO/VICODIN) 5-325 MG tablet, Take 1 tablet by mouth every 6 (six) hours as needed., Disp: 10 tablet, Rfl: 0   hydrOXYzine (ATARAX/VISTARIL) 50 MG tablet, Take 50 mg by mouth 3 (three) times daily., Disp: , Rfl:    ibuprofen (ADVIL) 800 MG tablet, Take 1 tablet (800 mg total) by mouth every 6 (six) hours as needed., Disp: 60 tablet, Rfl: 1   lisinopril (ZESTRIL) 40 MG tablet, Take 40 mg by mouth daily., Disp: , Rfl:    loratadine (  CLARITIN) 10 MG tablet, Take 10 mg by mouth daily., Disp: , Rfl:    methocarbamol (ROBAXIN) 500 MG tablet, Take 1 tablet (500 mg total) by mouth 2 (two) times daily., Disp: 20 tablet, Rfl: 0   methylPREDNISolone (MEDROL DOSEPAK) 4 MG TBPK tablet, Take as directed, Disp: 21 each, Rfl: 0   mineral oil light external liquid, Apply 1 application topically 2 (two) times daily as needed (inner ear itching.)., Disp: , Rfl:    montelukast (SINGULAIR) 10 MG tablet, TAKE 1 TABLET BY MOUTH EVERYDAY AT BEDTIME, Disp: 90 tablet, Rfl: 1   Multiple Vitamin (MULTIVITAMIN WITH MINERALS) TABS tablet, Take 1 tablet by mouth daily., Disp: , Rfl:    ondansetron (ZOFRAN) 4 MG tablet, Take 1 tablet (4 mg total) by mouth  every 6 (six) hours as needed for nausea. (Patient not taking: Reported on 12/07/2019), Disp: 20 tablet, Rfl: 0   oxyCODONE-acetaminophen (PERCOCET) 5-325 MG tablet, Take 1 tablet by mouth every 4 (four) hours as needed for severe pain., Disp: 30 tablet, Rfl: 0   oxyCODONE-acetaminophen (PERCOCET) 5-325 MG tablet, Take 1 tablet by mouth every 4 (four) hours as needed for severe pain., Disp: 30 tablet, Rfl: 0   pantoprazole (PROTONIX) 40 MG tablet, Take 40 mg by mouth daily., Disp: , Rfl:    polyethylene glycol-electrolytes (NULYTELY/GOLYTELY) 420 g solution, Take 4,000 mLs by mouth as directed., Disp: 4000 mL, Rfl: 0   senna (SENOKOT) 8.6 MG TABS tablet, Take 2 tablets (17.2 mg total) by mouth at bedtime. (Patient not taking: Reported on 12/07/2019), Disp: 60 tablet, Rfl: 0   SIMPLY SALINE NA, Place 1 spray into the nose 4 (four) times daily as needed (congestion.)., Disp: , Rfl:    tiZANidine (ZANAFLEX) 2 MG tablet, Take 1 tablet (2 mg total) by mouth every 8 (eight) hours as needed for muscle spasms., Disp: 21 tablet, Rfl: 0   traMADol (ULTRAM) 50 MG tablet, Take 50 mg by mouth every 6 (six) hours as needed., Disp: , Rfl:    Turmeric 500 MG TABS, Take 500 mg by mouth 2 (two) times a week., Disp: , Rfl:    Vitamin D, Ergocalciferol, (DRISDOL) 1.25 MG (50000 UNIT) CAPS capsule, Take 50,000 Units by mouth every Monday., Disp: , Rfl:   Social History   Tobacco Use  Smoking Status Former   Types: Cigarettes   Quit date: 1989   Years since quitting: 35.4  Smokeless Tobacco Never    No Known Allergies Objective:  There were no vitals filed for this visit. There is no height or weight on file to calculate BMI. Constitutional Well developed. Well nourished.  Vascular Dorsalis pedis pulses palpable bilaterally. Posterior tibial pulses palpable bilaterally. Capillary refill normal to all digits.  No cyanosis or clubbing noted. Pedal hair growth normal.  Neurologic Normal speech. Oriented to  person, place, and time. Epicritic sensation to light touch grossly present bilaterally.  Dermatologic Nails well groomed and normal in appearance. No open wounds. No skin lesions.  Orthopedic: Right decrease in dorsiflexion of the foot good strength noted in the plantarflexion inversion eversion of the foot.  Extensor tendon strength is 3 out of 5   Radiographs: None Assessment:   1. Extensor tendinitis of foot    Plan:  Patient was evaluated and treated and all questions answered.  Right extensor tendinitis -All questions and concerns were discussed with the patient extensive detail given the amount of strength issues that she is experiencing I believe she will benefit from physical therapy for  6 to 8 weeks to help increase her range of motion and strength. -[facility] was given   No follow-ups on file.

## 2023-01-04 ENCOUNTER — Other Ambulatory Visit: Payer: Self-pay | Admitting: Radiology

## 2023-01-04 ENCOUNTER — Ambulatory Visit: Payer: Medicare Other | Attending: Family

## 2023-01-04 DIAGNOSIS — R002 Palpitations: Secondary | ICD-10-CM

## 2023-01-04 DIAGNOSIS — I498 Other specified cardiac arrhythmias: Secondary | ICD-10-CM

## 2023-01-04 NOTE — Progress Notes (Unsigned)
Enrolled patient for a 3 day Zio XT monitor to be mailed to patients home   DOD to read 

## 2023-01-17 ENCOUNTER — Encounter: Payer: Self-pay | Admitting: Family

## 2023-01-17 ENCOUNTER — Other Ambulatory Visit: Payer: Self-pay | Admitting: Family

## 2023-01-17 DIAGNOSIS — Z1231 Encounter for screening mammogram for malignant neoplasm of breast: Secondary | ICD-10-CM

## 2023-01-18 DIAGNOSIS — R002 Palpitations: Secondary | ICD-10-CM

## 2023-01-18 DIAGNOSIS — I498 Other specified cardiac arrhythmias: Secondary | ICD-10-CM

## 2023-01-25 ENCOUNTER — Other Ambulatory Visit: Payer: Self-pay | Admitting: Podiatry

## 2023-02-08 ENCOUNTER — Ambulatory Visit: Payer: Medicare Other

## 2023-03-01 ENCOUNTER — Ambulatory Visit: Payer: Medicare Other | Admitting: Podiatry

## 2023-03-08 ENCOUNTER — Encounter: Payer: Self-pay | Admitting: Podiatry

## 2023-03-08 ENCOUNTER — Ambulatory Visit: Payer: Medicare Other | Admitting: Podiatry

## 2023-03-08 DIAGNOSIS — M778 Other enthesopathies, not elsewhere classified: Secondary | ICD-10-CM | POA: Diagnosis not present

## 2023-03-08 DIAGNOSIS — M21371 Foot drop, right foot: Secondary | ICD-10-CM | POA: Diagnosis not present

## 2023-03-08 MED ORDER — PREGABALIN 100 MG PO CAPS: 100.0000 mg | ORAL_CAPSULE | Freq: Two times a day (BID) | ORAL | 0 refills | Status: DC

## 2023-03-08 NOTE — Progress Notes (Signed)
Subjective:  Patient ID: Christina Ray, female    DOB: 1955-11-16,  MRN: [id number]  Chief Complaint  Patient presents with   Foot Pain    Follow up extensor tendonitis right   "I've been in PT and I have been making progress, but I feel like I should be further along"    67 y.o. female presents with the above complaint.  Patient presents with right generalized weakness/fatigue extensor tendon.  Patient states that she is not able to wiggle her toes.  She feels weakness in the right anterior leg.  She wanted to discuss treatment options for she has not done physical therapy.  She denies any other acute complaints pain scale is 2 out of 10   Review of Systems: Negative except as noted in the HPI. Denies N/V/F/Ch.  Past Medical History:  Diagnosis Date   Allergy    Anxiety    Arthritis    Asthma    Chronic back pain    Complication of anesthesia    Diverticulitis    GERD (gastroesophageal reflux disease)    Hyperlipidemia    Hypertension    PONV (postoperative nausea and vomiting)    Vertigo     Current Outpatient Medications:    busPIRone (BUSPAR) 10 MG tablet, Take 10 mg by mouth 2 (two) times daily., Disp: , Rfl:    cyclobenzaprine (FLEXERIL) 5 MG tablet, Take 5 mg by mouth 3 (three) times daily as needed., Disp: , Rfl:    gabapentin (NEURONTIN) 100 MG capsule, Take 200 mg by mouth 3 (three) times daily., Disp: , Rfl:    pregabalin (LYRICA) 100 MG capsule, Take 1 capsule (100 mg total) by mouth 2 (two) times daily., Disp: 60 capsule, Rfl: 0   atenolol (TENORMIN) 50 MG tablet, TAKE 1 TABLET BY MOUTH EVERY DAY, Disp: 90 tablet, Rfl: 2   atorvastatin (LIPITOR) 20 MG tablet, Take 20 mg by mouth daily., Disp: , Rfl:    Calcium-Magnesium-Zinc (CAL-MAG-ZINC PO), Take 1 tablet by mouth 2 (two) times a week., Disp: , Rfl:    cefadroxil (DURICEF) 500 MG capsule, Take 1 capsule (500 mg total) by mouth 2 (two) times daily., Disp: 28 capsule, Rfl: 0   celecoxib (CELEBREX) 200 MG  capsule, Take 1 capsule (200 mg total) by mouth daily., Disp: 30 capsule, Rfl: 2   ciclopirox (PENLAC) 8 % solution, Apply topically at bedtime. Apply over nail and surrounding skin. Apply daily over previous coat. After seven (7) days, may remove with alcohol and continue cycle., Disp: 6.6 mL, Rfl: 0   ciclopirox (PENLAC) 8 % solution, Apply topically at bedtime. Apply over nail and surrounding skin. Apply daily over previous coat. After seven (7) days, may remove with alcohol and continue cycle., Disp: 6.6 mL, Rfl: 0   Cyanocobalamin (VITAMIN B-12) 2500 MCG SUBL, Take 2,500 mcg by mouth 2 (two) times a week., Disp: , Rfl:    docusate sodium (COLACE) 100 MG capsule, Take 1 capsule (100 mg total) by mouth 2 (two) times daily. (Patient taking differently: Take 100 mg by mouth daily as needed for moderate constipation. ), Disp: 60 capsule, Rfl: 0   fluticasone (FLONASE) 50 MCG/ACT nasal spray, Place 2 sprays into both nostrils daily., Disp: 16 g, Rfl: 6   hydrochlorothiazide (HYDRODIURIL) 25 MG tablet, Take 25 mg by mouth daily., Disp: , Rfl:    HYDROcodone-acetaminophen (NORCO/VICODIN) 5-325 MG tablet, Take 1 tablet by mouth every 6 (six) hours as needed., Disp: 10 tablet, Rfl: 0   hydrOXYzine (  ATARAX/VISTARIL) 50 MG tablet, Take 50 mg by mouth 3 (three) times daily., Disp: , Rfl:    ibuprofen (ADVIL) 800 MG tablet, Take 1 tablet (800 mg total) by mouth every 6 (six) hours as needed., Disp: 60 tablet, Rfl: 1   lisinopril (ZESTRIL) 40 MG tablet, Take 40 mg by mouth daily., Disp: , Rfl:    loratadine (CLARITIN) 10 MG tablet, Take 10 mg by mouth daily., Disp: , Rfl:    meloxicam (MOBIC) 15 MG tablet, Take 1 tablet (15 mg total) by mouth daily., Disp: 30 tablet, Rfl: 0   methocarbamol (ROBAXIN) 500 MG tablet, Take 1 tablet (500 mg total) by mouth 2 (two) times daily., Disp: 20 tablet, Rfl: 0   methylPREDNISolone (MEDROL DOSEPAK) 4 MG TBPK tablet, Take as directed, Disp: 21 each, Rfl: 0   mineral oil light  external liquid, Apply 1 application topically 2 (two) times daily as needed (inner ear itching.)., Disp: , Rfl:    montelukast (SINGULAIR) 10 MG tablet, TAKE 1 TABLET BY MOUTH EVERYDAY AT BEDTIME, Disp: 90 tablet, Rfl: 1   Multiple Vitamin (MULTIVITAMIN WITH MINERALS) TABS tablet, Take 1 tablet by mouth daily., Disp: , Rfl:    ondansetron (ZOFRAN) 4 MG tablet, Take 1 tablet (4 mg total) by mouth every 6 (six) hours as needed for nausea. (Patient not taking: Reported on 12/07/2019), Disp: 20 tablet, Rfl: 0   oxyCODONE-acetaminophen (PERCOCET) 5-325 MG tablet, Take 1 tablet by mouth every 4 (four) hours as needed for severe pain., Disp: 30 tablet, Rfl: 0   oxyCODONE-acetaminophen (PERCOCET) 5-325 MG tablet, Take 1 tablet by mouth every 4 (four) hours as needed for severe pain., Disp: 30 tablet, Rfl: 0   pantoprazole (PROTONIX) 40 MG tablet, Take 40 mg by mouth daily., Disp: , Rfl:    polyethylene glycol-electrolytes (NULYTELY/GOLYTELY) 420 g solution, Take 4,000 mLs by mouth as directed., Disp: 4000 mL, Rfl: 0   senna (SENOKOT) 8.6 MG TABS tablet, Take 2 tablets (17.2 mg total) by mouth at bedtime. (Patient not taking: Reported on 12/07/2019), Disp: 60 tablet, Rfl: 0   SIMPLY SALINE NA, Place 1 spray into the nose 4 (four) times daily as needed (congestion.)., Disp: , Rfl:    tiZANidine (ZANAFLEX) 2 MG tablet, Take 1 tablet (2 mg total) by mouth every 8 (eight) hours as needed for muscle spasms., Disp: 21 tablet, Rfl: 0   traMADol (ULTRAM) 50 MG tablet, Take 50 mg by mouth every 6 (six) hours as needed., Disp: , Rfl:    Turmeric 500 MG TABS, Take 500 mg by mouth 2 (two) times a week., Disp: , Rfl:    Vitamin D, Ergocalciferol, (DRISDOL) 1.25 MG (50000 UNIT) CAPS capsule, Take 50,000 Units by mouth every Monday., Disp: , Rfl:   Social History   Tobacco Use  Smoking Status Former   Current packs/day: 0.00   Types: Cigarettes   Quit date: 1989   Years since quitting: 35.6  Smokeless Tobacco Never     Allergies  Allergen Reactions   Fexofenadine     Other Reaction(s): Unknown   Objective:  There were no vitals filed for this visit. There is no height or weight on file to calculate BMI. Constitutional Well developed. Well nourished.  Vascular Dorsalis pedis pulses palpable bilaterally. Posterior tibial pulses palpable bilaterally. Capillary refill normal to all digits.  No cyanosis or clubbing noted. Pedal hair growth normal.  Neurologic Normal speech. Oriented to person, place, and time. Epicritic sensation to light touch grossly present bilaterally.  Dermatologic  Nails well groomed and normal in appearance. No open wounds. No skin lesions.  Orthopedic: Right decrease in dorsiflexion of the foot good strength noted in the plantarflexion inversion eversion of the foot.  Extensor tendon strength is 3 out of 5   Radiographs: None Assessment:   1. Extensor tendinitis of foot   2. Left foot drop     Plan:  Patient was evaluated and treated and all questions answered.  Right extensor tendinitis leading to dropfoot -All questions and concerns were discussed with the patient extensive detail given  -Continue physical therapy for another 6 to 8 weeks. -Given the nature of the dropfoot patient will benefit from AFO bracing.  She will be scheduled to see our orthotics department to get the AFO bracing. -I will switch her from gabapentin to Lyrica as gabapentin is not as effective.  No follow-ups on file.

## 2023-03-14 ENCOUNTER — Other Ambulatory Visit: Payer: Medicare Other

## 2023-05-08 ENCOUNTER — Ambulatory Visit: Payer: Medicare Other | Admitting: Podiatry

## 2023-10-11 ENCOUNTER — Other Ambulatory Visit: Payer: Self-pay | Admitting: Nurse Practitioner

## 2023-10-11 DIAGNOSIS — Z1231 Encounter for screening mammogram for malignant neoplasm of breast: Secondary | ICD-10-CM

## 2023-10-13 ENCOUNTER — Other Ambulatory Visit: Payer: Self-pay

## 2023-10-13 ENCOUNTER — Emergency Department (HOSPITAL_COMMUNITY)
Admission: EM | Admit: 2023-10-13 | Discharge: 2023-10-13 | Discharge status: Against Medical Advice | Payer: Medicare (Managed Care) | Attending: Emergency Medicine | Admitting: Emergency Medicine

## 2023-10-13 ENCOUNTER — Encounter (HOSPITAL_COMMUNITY): Payer: Self-pay

## 2023-10-13 DIAGNOSIS — Z5321 Procedure and treatment not carried out due to patient leaving prior to being seen by health care provider: Secondary | ICD-10-CM | POA: Insufficient documentation

## 2023-10-13 DIAGNOSIS — R202 Paresthesia of skin: Secondary | ICD-10-CM | POA: Diagnosis not present

## 2023-10-13 DIAGNOSIS — Z96649 Presence of unspecified artificial hip joint: Secondary | ICD-10-CM | POA: Diagnosis not present

## 2023-10-13 DIAGNOSIS — M79604 Pain in right leg: Secondary | ICD-10-CM | POA: Insufficient documentation

## 2023-10-13 NOTE — ED Triage Notes (Signed)
 Pt arrived reporting R leg pain pain, atraumatic. States intermittent tingling. Able to bare weight Hx of hip replacement. Denies cp,shob  any other symptoms

## 2023-10-13 NOTE — ED Provider Notes (Signed)
 I went to see the patient at 1:33 PM.  She had already left the emergency department and was not seen by me   Bethann Berkshire, MD 10/13/23 1335

## 2023-10-13 NOTE — ED Notes (Signed)
 Patient and daughter are both agitated and very vocal about how long it is taking. Patient commented saying "I am about to leave and go to another hospital-this is ridiculous"

## 2023-10-14 ENCOUNTER — Emergency Department (HOSPITAL_BASED_OUTPATIENT_CLINIC_OR_DEPARTMENT_OTHER)
Admission: EM | Admit: 2023-10-14 | Discharge: 2023-10-14 | Disposition: A | Discharge status: Home / Self Care | Payer: Medicare (Managed Care) | Attending: Emergency Medicine | Admitting: Emergency Medicine

## 2023-10-14 ENCOUNTER — Encounter (HOSPITAL_BASED_OUTPATIENT_CLINIC_OR_DEPARTMENT_OTHER): Payer: Self-pay

## 2023-10-14 ENCOUNTER — Emergency Department (HOSPITAL_BASED_OUTPATIENT_CLINIC_OR_DEPARTMENT_OTHER): Payer: Medicare (Managed Care)

## 2023-10-14 ENCOUNTER — Other Ambulatory Visit: Payer: Self-pay

## 2023-10-14 DIAGNOSIS — Z96641 Presence of right artificial hip joint: Secondary | ICD-10-CM | POA: Insufficient documentation

## 2023-10-14 DIAGNOSIS — Z79899 Other long term (current) drug therapy: Secondary | ICD-10-CM | POA: Diagnosis not present

## 2023-10-14 DIAGNOSIS — M4726 Other spondylosis with radiculopathy, lumbar region: Secondary | ICD-10-CM | POA: Diagnosis not present

## 2023-10-14 DIAGNOSIS — M79661 Pain in right lower leg: Secondary | ICD-10-CM | POA: Diagnosis present

## 2023-10-14 MED ORDER — ACETAMINOPHEN 500 MG PO TABS: 500.0000 mg | ORAL_TABLET | Freq: Four times a day (QID) | ORAL | 0 refills | Status: DC | PRN

## 2023-10-14 MED ORDER — LIDOCAINE 4 % EX PTCH: 1.0000 | MEDICATED_PATCH | Freq: Two times a day (BID) | CUTANEOUS | 0 refills | Status: AC

## 2023-10-14 MED ORDER — HYDROCODONE-ACETAMINOPHEN 5-325 MG PO TABS
1.0000 | ORAL_TABLET | Freq: Once | ORAL | Status: AC
  Administered 2023-10-14: 1 via ORAL
  Filled 2023-10-14: qty 1

## 2023-10-14 MED ORDER — HYDROCODONE-ACETAMINOPHEN 5-325 MG PO TABS: 1.0000 | ORAL_TABLET | Freq: Three times a day (TID) | ORAL | 0 refills | Status: AC | PRN

## 2023-10-14 NOTE — ED Triage Notes (Addendum)
 In for eval of right leg pain. Ongoing for over a year. Able to bear weight, uses Rolator. Denies any recent falls or trauma or known injury. Was seen Wednesday with rx for steroids and told her she had Covid. Also taking ibuprofen 800 mg PRN, last dose 1 hour PTA.

## 2023-10-14 NOTE — Discharge Instructions (Addendum)
 Call the neurosurgeon as needed.  As discussed, we do see evidence of degenerative spine disease on your CT scan.  Take the pain medicine only if the pain is severe.

## 2023-10-14 NOTE — ED Provider Notes (Addendum)
 [place] EMERGENCY DEPARTMENT AT [facility] Provider Note   CSN: [id number] Arrival date & time: 10/14/23  1541     History  Chief Complaint  Patient presents with   Leg Pain    Christina Ray is a 68 y.o. female.  HPI    68 year old female comes in with chief complaint of right lower extremity pain. She complains of right lower extremity difficulty for the last year.  She indicates that she had hip replacement on the right side at least 3 years ago.  Last year she had right foot injury, and ever since then she has been having numbness to the right plantar aspect of the foot.  She has seen podiatrist for it.  In the last 3 to 4 weeks, she has been having pain in her entire right lower extremity.  Pain is described as sharp pain, burning pain and she has associated numbness and tingling to her leg.  The pain appears to be starting from her back.  She denies any provoking symptoms.  Patient also feels that she has slight weakness of the right side, but it is unclear to her if it is chronic weakness or new.  Patient has no associated urinary incontinence or retention, numbness in the perineal region.  Home Medications Prior to Admission medications   Medication Sig Start Date End Date Taking? Authorizing Provider  acetaminophen (TYLENOL) 500 MG tablet Take 1 tablet (500 mg total) by mouth every 6 (six) hours as needed. 10/14/23  Yes Derwood Kaplan, MD  HYDROcodone-acetaminophen (NORCO/VICODIN) 5-325 MG tablet Take 1 tablet by mouth every 8 (eight) hours as needed for up to 3 days for severe pain (pain score 7-10). 10/14/23 10/17/23 Yes Imogean Ciampa, MD  lidocaine 4 % Place 1 patch onto the skin 2 (two) times daily. 10/14/23  Yes Ronie Barnhart, MD  atenolol (TENORMIN) 50 MG tablet TAKE 1 TABLET BY MOUTH EVERY DAY 04/04/18   Dolores Patty C, DO  atorvastatin (LIPITOR) 20 MG tablet Take 20 mg by mouth daily. 09/03/19   [provider]  busPIRone (BUSPAR) 10 MG tablet  Take 10 mg by mouth 2 (two) times daily. 02/14/23   [provider]  Calcium-Magnesium-Zinc (CAL-MAG-ZINC PO) Take 1 tablet by mouth 2 (two) times a week.    [provider]  cefadroxil (DURICEF) 500 MG capsule Take 1 capsule (500 mg total) by mouth 2 (two) times daily. 12/10/19   Swinteck, Arlys John, MD  celecoxib (CELEBREX) 200 MG capsule Take 1 capsule (200 mg total) by mouth daily. 10/15/19   Swinteck, Arlys John, MD  ciclopirox (PENLAC) 8 % solution Apply topically at bedtime. Apply over nail and surrounding skin. Apply daily over previous coat. After seven (7) days, may remove with alcohol and continue cycle. 05/03/21   Candelaria Stagers, DPM  ciclopirox (PENLAC) 8 % solution Apply topically at bedtime. Apply over nail and surrounding skin. Apply daily over previous coat. After seven (7) days, may remove with alcohol and continue cycle. 05/03/21   Candelaria Stagers, DPM  Cyanocobalamin (VITAMIN B-12) 2500 MCG SUBL Take 2,500 mcg by mouth 2 (two) times a week.    [provider]  cyclobenzaprine (FLEXERIL) 5 MG tablet Take 5 mg by mouth 3 (three) times daily as needed. 01/17/23   [provider]  docusate sodium (COLACE) 100 MG capsule Take 1 capsule (100 mg total) by mouth 2 (two) times daily. Patient taking differently: Take 100 mg by mouth daily as needed for moderate constipation.  10/15/19  Swinteck, Arlys John, MD  fluticasone [facility]) 50 MCG/ACT nasal spray Place 2 sprays into both nostrils daily. 10/24/17   Tillman Sers, DO  gabapentin (NEURONTIN) 100 MG capsule Take 200 mg by mouth 3 (three) times daily. 10/30/22   [provider]  hydrochlorothiazide (HYDRODIURIL) 25 MG tablet Take 25 mg by mouth daily. 09/03/19   [provider]  hydrOXYzine (ATARAX/VISTARIL) 50 MG tablet Take 50 mg by mouth 3 (three) times daily.    [provider]  ibuprofen (ADVIL) 800 MG tablet Take 1 tablet (800 mg total) by mouth every 6 (six) hours as needed. 04/24/21    Candelaria Stagers, DPM  lisinopril (ZESTRIL) 40 MG tablet Take 40 mg by mouth daily. 08/17/19   [provider]  loratadine (CLARITIN) 10 MG tablet Take 10 mg by mouth daily.    [provider]  meloxicam (MOBIC) 15 MG tablet Take 1 tablet (15 mg total) by mouth daily. 12/26/22   Candelaria Stagers, DPM  methocarbamol (ROBAXIN) 500 MG tablet Take 1 tablet (500 mg total) by mouth 2 (two) times daily. 09/30/22   Gwyneth Sprout, MD  methylPREDNISolone (MEDROL DOSEPAK) 4 MG TBPK tablet Take as directed 11/14/22   Candelaria Stagers, DPM  mineral oil light external liquid Apply 1 application topically 2 (two) times daily as needed (inner ear itching.).    [provider]  montelukast (SINGULAIR) 10 MG tablet TAKE 1 TABLET BY MOUTH EVERYDAY AT BEDTIME 09/04/18   Tillman Sers, DO  Multiple Vitamin (MULTIVITAMIN WITH MINERALS) TABS tablet Take 1 tablet by mouth daily.    [provider]  ondansetron (ZOFRAN) 4 MG tablet Take 1 tablet (4 mg total) by mouth every 6 (six) hours as needed for nausea. Patient not taking: Reported on 12/07/2019 10/15/19   Samson Frederic, MD  pantoprazole (PROTONIX) 40 MG tablet Take 40 mg by mouth daily. 02/27/19   [provider]  polyethylene glycol-electrolytes (NULYTELY/GOLYTELY) 420 g solution Take 4,000 mLs by mouth as directed. 04/15/19   Rachael Fee, MD  pregabalin (LYRICA) 100 MG capsule Take 1 capsule (100 mg total) by mouth 2 (two) times daily. 03/08/23   Candelaria Stagers, DPM  senna (SENOKOT) 8.6 MG TABS tablet Take 2 tablets (17.2 mg total) by mouth at bedtime. Patient not taking: Reported on 12/07/2019 10/15/19   Samson Frederic, MD  SIMPLY SALINE NA Place 1 spray into the nose 4 (four) times daily as needed (congestion.).    [provider]  tiZANidine (ZANAFLEX) 2 MG tablet Take 1 tablet (2 mg total) by mouth every 8 (eight) hours as needed for muscle spasms. 11/18/20   Derwood Kaplan, MD  traMADol (ULTRAM) 50 MG tablet  Take 50 mg by mouth every 6 (six) hours as needed.    [provider]  Turmeric 500 MG TABS Take 500 mg by mouth 2 (two) times a week.    [provider]  Vitamin D, Ergocalciferol, (DRISDOL) 1.25 MG (50000 UNIT) CAPS capsule Take 50,000 Units by mouth every Monday. 09/29/19   [provider]      Allergies    Fexofenadine    Review of Systems   Review of Systems  All other systems reviewed and are negative.   Physical Exam Updated Vital Signs BP (!) 163/83 (BP Location: Right Arm)   Pulse (!) 52   Temp 98.5 F (36.9 C)   Resp 16   Ht 5\' 11"  (1.803 m)   Wt 113.4 kg   SpO2  99%   BMI 34.87 kg/m  Physical Exam Vitals and nursing note reviewed.  Constitutional:      Appearance: She is well-developed.  HENT:     Head: Atraumatic.  Cardiovascular:     Rate and Rhythm: Normal rate.  Pulmonary:     Effort: Pulmonary effort is normal.  Musculoskeletal:     Cervical back: Normal range of motion and neck supple.     Comments: Patient has tenderness to palpation over the lumbar spine region. She has subjective numbness to the right leg, distal to the knee. Negative passive straight leg test. Patient has 5 strength in the right lower extremity, but has difficulty raising it compared to the left side She has 1+ and equal patellar reflex bilaterally  Skin:    General: Skin is warm and dry.  Neurological:     Mental Status: She is alert and oriented to person, place, and time.     Cranial Nerves: No cranial nerve deficit.     Sensory: Sensory deficit present.     Comments: Upper extremity sensory exam, motor exam is normal and reassuring     ED Results / Procedures / Treatments   Labs (all labs ordered are listed, but only abnormal results are displayed) Labs Reviewed - No data to display  EKG None  Radiology CT Lumbar Spine Wo Contrast Result Date: 10/14/2023 CLINICAL DATA:  Right-sided leg pain EXAM: CT LUMBAR SPINE WITHOUT CONTRAST TECHNIQUE:  Multidetector CT imaging of the lumbar spine was performed without intravenous contrast administration. Multiplanar CT image reconstructions were also generated. RADIATION DOSE REDUCTION: This exam was performed according to the departmental dose-optimization program which includes automated exposure control, adjustment of the mA and/or kV according to patient size and/or use of iterative reconstruction technique. COMPARISON:  CT 09/07/2020 FINDINGS: Segmentation: 5 lumbar type vertebrae. Alignment: Trace anterolisthesis L3 on L4 and L4 on L5. Vertebrae: No acute fracture or focal pathologic process. Paraspinal and other soft tissues: Aortic atherosclerosis. Diverticular disease of the sigmoid colon. Disc levels: At L1-L2, disc space narrowing and mild vacuum disc. No canal stenosis. Left lateral disc bulge. Moderate hypertrophic facet degenerative changes. No high-grade foraminal narrowing. At L2-L3, patent disc space. No canal stenosis. Moderate severe hypertrophic facet arthropathy. No high-grade foraminal narrowing. At L3-L4, patent disc space. Left subarticular and extraforaminal disc protrusion. Severe hypertrophic facet degenerative changes. No high-grade canal stenosis. No high-grade foraminal narrowing. At L4-L5, advanced disc space narrowing with vacuum disc. Moderate diffuse disc bulge. Severe hypertrophic facet degenerative changes with ligamentum flavum thickening. Resultant multifactorial moderate severe canal stenosis. Bilateral foraminal encroachment by disc material At L5-S1, advanced disc space narrowing with vacuum disc. Diffuse disc bulge with suspected central disc protrusion. Severe hypertrophic facet arthropathy. At least mild canal stenosis. Bilateral foraminal narrowing. IMPRESSION: 1. No CT evidence for acute osseous abnormality. 2. Multilevel degenerative changes with moderate severe hypertrophic facet arthropathy throughout the lumbar spine. Advanced degenerative changes at L4-L5 and  L5-S1 with suspected moderate severe canal stenosis at L4-L5. 3. Aortic atherosclerosis. Electronically Signed   By: Jasmine Pang M.D.   On: 10/14/2023 19:07    Procedures Procedures    Medications Ordered in ED Medications  HYDROcodone-acetaminophen (NORCO/VICODIN) 5-325 MG per tablet 1 tablet (1 tablet Oral Given 10/14/23 1729)    ED Course/ Medical Decision Making/ A&P  Medical Decision Making Amount and/or Complexity of Data Reviewed Radiology: ordered.  Risk OTC drugs. Prescription drug management.   This patient presents to the ED with chief complaint(s) of lower extremity pain, numbness, weakness with pertinent past medical history of degenerative lumbar spine disease and right hip replacement, right foot injury for which she saw podiatrist.timing of the patient's symptoms is great.  It appears that she has had difficulty with her right lower extremity for at least 1 year.  More recently she has had some new sharp, burning type pain , and numbness.  Her symptoms have at least been present for 3 weeks.  The complaint involves an extensive differential diagnosis and also carries with it a high risk of complications and morbidity.    The differential diagnosis includes : Degenerative lumbar spine disease, lumbar radiculopathy, sciatica, lumbar myelopathy, peripheral neuropathy, electrolyte abnormality, nutrient deficiency.  The initial plan is to get CT scan of the lumbar spine without contrast.  I do not think CT scan of the brain is indicated, her neuroexam is nonfocal besides the right lower extremity only.  Low suspicion for stroke. I have reviewed records.  It appears that patient has been seen in the ER last year for her right foot injury and subsequently for additional lower extremity symptoms.  She has had radiographs from that encounter, but CT scan I think will be more helpful in helping with the morphology.   Additional history  obtained: Records reviewed  podiatry note, previous imaging including x-rays of her lumbar spine and PCP visits.   Independent visualization and interpretation of imaging:   Treatment and Reassessment: Results of the ER workup discussed with the patient. It is unclear to me if her symptoms are purely related to degenerative spine disease or if there is neuropathy as well. Patient informs me that she has discussed her right lower extremity symptoms with her PCP, but there is no specific plan. I think it would be best for patient to follow-up with neurosurgery first, and if they clear her then to follow-up with neurology as well.  This recommendation has been discussed with the patient.   Final Clinical Impression(s) / ED Diagnoses Final diagnoses:  Osteoarthritis of spine with radiculopathy, lumbar region    Rx / DC Orders ED Discharge Orders          Ordered    HYDROcodone-acetaminophen (NORCO/VICODIN) 5-325 MG tablet  Every 8 hours PRN        10/14/23 1924    acetaminophen (TYLENOL) 500 MG tablet  Every 6 hours PRN        10/14/23 1926    lidocaine 4 %  2 times daily        10/14/23 1926              Derwood Kaplan, MD 10/14/23 1926    Derwood Kaplan, MD 10/14/23 [phone]

## 2023-10-31 DIAGNOSIS — M7061 Trochanteric bursitis, right hip: Secondary | ICD-10-CM | POA: Insufficient documentation

## 2023-11-05 ENCOUNTER — Other Ambulatory Visit (HOSPITAL_COMMUNITY): Payer: Self-pay | Admitting: Neurosurgery

## 2023-11-05 DIAGNOSIS — M5416 Radiculopathy, lumbar region: Secondary | ICD-10-CM

## 2023-11-11 ENCOUNTER — Ambulatory Visit (HOSPITAL_COMMUNITY): Payer: Medicare (Managed Care)

## 2023-11-11 ENCOUNTER — Encounter (HOSPITAL_COMMUNITY): Payer: Self-pay

## 2023-11-19 ENCOUNTER — Other Ambulatory Visit: Payer: Self-pay | Admitting: Nurse Practitioner

## 2023-11-19 DIAGNOSIS — M79671 Pain in right foot: Secondary | ICD-10-CM

## 2023-11-20 ENCOUNTER — Other Ambulatory Visit: Payer: Self-pay | Admitting: Nurse Practitioner

## 2023-11-20 DIAGNOSIS — G8929 Other chronic pain: Secondary | ICD-10-CM

## 2023-11-22 ENCOUNTER — Encounter: Payer: Self-pay | Admitting: Nurse Practitioner

## 2023-11-30 ENCOUNTER — Inpatient Hospital Stay: Admission: RE | Admit: 2023-11-30 | Payer: Medicare (Managed Care) | Source: Ambulatory Visit

## 2023-11-30 ENCOUNTER — Other Ambulatory Visit: Payer: Medicare (Managed Care)

## 2023-12-02 ENCOUNTER — Other Ambulatory Visit: Payer: Medicare (Managed Care)

## 2023-12-05 ENCOUNTER — Other Ambulatory Visit: Payer: Self-pay | Admitting: Nurse Practitioner

## 2023-12-05 DIAGNOSIS — M7989 Other specified soft tissue disorders: Secondary | ICD-10-CM

## 2023-12-09 ENCOUNTER — Other Ambulatory Visit: Payer: Medicare (Managed Care)

## 2023-12-17 ENCOUNTER — Other Ambulatory Visit: Payer: Medicare (Managed Care)

## 2023-12-21 ENCOUNTER — Ambulatory Visit
Admission: RE | Admit: 2023-12-21 | Discharge: 2023-12-21 | Disposition: A | Discharge status: Home / Self Care | Payer: Medicare (Managed Care) | Source: Ambulatory Visit | Attending: Nurse Practitioner | Admitting: Nurse Practitioner

## 2023-12-21 DIAGNOSIS — G8929 Other chronic pain: Secondary | ICD-10-CM

## 2023-12-21 DIAGNOSIS — M79671 Pain in right foot: Secondary | ICD-10-CM

## 2023-12-24 ENCOUNTER — Ambulatory Visit
Admission: RE | Admit: 2023-12-24 | Discharge: 2023-12-24 | Disposition: A | Discharge status: Home / Self Care | Payer: Medicare (Managed Care) | Source: Ambulatory Visit | Attending: Nurse Practitioner | Admitting: Nurse Practitioner

## 2023-12-24 DIAGNOSIS — M7989 Other specified soft tissue disorders: Secondary | ICD-10-CM

## 2024-01-11 ENCOUNTER — Other Ambulatory Visit: Payer: Medicare (Managed Care)

## 2024-01-28 DIAGNOSIS — M7122 Synovial cyst of popliteal space [Baker], left knee: Secondary | ICD-10-CM | POA: Insufficient documentation

## 2024-02-07 ENCOUNTER — Encounter: Payer: Self-pay | Admitting: Advanced Practice Midwife

## 2024-03-21 ENCOUNTER — Encounter (HOSPITAL_BASED_OUTPATIENT_CLINIC_OR_DEPARTMENT_OTHER): Payer: Self-pay | Admitting: Emergency Medicine

## 2024-03-21 ENCOUNTER — Other Ambulatory Visit: Payer: Self-pay

## 2024-03-21 ENCOUNTER — Emergency Department (HOSPITAL_BASED_OUTPATIENT_CLINIC_OR_DEPARTMENT_OTHER)
Admission: EM | Admit: 2024-03-21 | Discharge: 2024-03-21 | Disposition: A | Discharge status: Home / Self Care | Payer: Medicare (Managed Care) | Attending: Emergency Medicine | Admitting: Emergency Medicine

## 2024-03-21 DIAGNOSIS — M79641 Pain in right hand: Secondary | ICD-10-CM | POA: Diagnosis present

## 2024-03-21 DIAGNOSIS — L03012 Cellulitis of left finger: Secondary | ICD-10-CM | POA: Insufficient documentation

## 2024-03-21 MED ORDER — CEPHALEXIN 500 MG PO CAPS: 500.0000 mg | ORAL_CAPSULE | Freq: Four times a day (QID) | ORAL | 0 refills | Status: DC

## 2024-03-21 MED ORDER — LIDOCAINE HCL (PF) 1 % IJ SOLN
5.0000 mL | Freq: Once | INTRAMUSCULAR | Status: AC
  Administered 2024-03-21: 5 mL
  Filled 2024-03-21: qty 5

## 2024-03-21 NOTE — ED Notes (Signed)
 Reviewed AVS/discharge instruction with patient. Time allotted for and all questions answered. Patient is agreeable for d/c and escorted to ed exit by staff.

## 2024-03-21 NOTE — Discharge Instructions (Addendum)
 You can wash the finger 2-3 times daily with an antibacterial soap (ie Dial). Take the antibiotic until completed. If symptoms worsen, please have your doctor recheck the wound. Return to the ED with any new or concerning symptoms at any time.

## 2024-03-21 NOTE — ED Triage Notes (Signed)
 Left pointer finger swelling and pain Started Thursday, notice sharp pain while helping at a shelter.

## 2024-03-21 NOTE — Progress Notes (Signed)
 Assessment complete on patient. Patients' SPO2 100% on room air, HR 62, RR 18. No respiratory distress noted; but patient stated she has pain in her left index finger that seemed to be getting worse, there was redness and fever according to patient and she felt dizzy/lightheadedness. Patient is requesting  medication for pain. Information relayed to Nursing.

## 2024-03-21 NOTE — ED Provider Notes (Signed)
 [place] EMERGENCY DEPARTMENT AT [facility] Provider Note   CSN: [id number] Arrival date & time: 03/21/24  1155     Patient presents with: Hand Pain   Christina Ray is a 68 y.o. female.   Patient to ED with left index finger pain and swelling for the past several days. She notes that just prior to symptoms she put her hand into a drawer and something jabbed the finger. No bleeding at the time.   The history is provided by the patient. No language interpreter was used.  Hand Pain       Prior to Admission medications   Medication Sig Start Date End Date Taking? Authorizing Provider  cephALEXin  (KEFLEX ) 500 MG capsule Take 1 capsule (500 mg total) by mouth 4 (four) times daily. 03/21/24  Yes Nadezhda Pollitt, Margit, PA-C  acetaminophen  (TYLENOL ) 500 MG tablet Take 1 tablet (500 mg total) by mouth every 6 (six) hours as needed. 10/14/23   Charlyn Sora, MD  atenolol  (TENORMIN ) 50 MG tablet TAKE 1 TABLET BY MOUTH EVERY DAY 04/04/18   Riccio, Angela C, DO  atorvastatin  (LIPITOR) 20 MG tablet Take 20 mg by mouth daily. 09/03/19   [provider]  busPIRone (BUSPAR) 10 MG tablet Take 10 mg by mouth 2 (two) times daily. 02/14/23   [provider]  Calcium -Magnesium-Zinc (CAL-MAG-ZINC PO) Take 1 tablet by mouth 2 (two) times a week.    [provider]  cefadroxil  (DURICEF) 500 MG capsule Take 1 capsule (500 mg total) by mouth 2 (two) times daily. 12/10/19   Swinteck, Redell, MD  celecoxib  (CELEBREX ) 200 MG capsule Take 1 capsule (200 mg total) by mouth daily. 10/15/19   Swinteck, Redell, MD  ciclopirox  (PENLAC ) 8 % solution Apply topically at bedtime. Apply over nail and surrounding skin. Apply daily over previous coat. After seven (7) days, may remove with alcohol  and continue cycle. 05/03/21   Tobie Franky SQUIBB, DPM  ciclopirox  (PENLAC ) 8 % solution Apply topically at bedtime. Apply over nail and surrounding skin. Apply daily over previous coat. After seven (7) days,  may remove with alcohol  and continue cycle. 05/03/21   Tobie Franky SQUIBB, DPM  Cyanocobalamin (VITAMIN B-12) 2500 MCG SUBL Take 2,500 mcg by mouth 2 (two) times a week.    [provider]  cyclobenzaprine (FLEXERIL) 5 MG tablet Take 5 mg by mouth 3 (three) times daily as needed. 01/17/23   [provider]  docusate sodium  (COLACE) 100 MG capsule Take 1 capsule (100 mg total) by mouth 2 (two) times daily. Patient taking differently: Take 100 mg by mouth daily as needed for moderate constipation.  10/15/19   Swinteck, Redell, MD  fluticasone  (FLONASE ) 50 MCG/ACT nasal spray Place 2 sprays into both nostrils daily. 10/24/17   Riccio, Angela C, DO  gabapentin (NEURONTIN) 100 MG capsule Take 200 mg by mouth 3 (three) times daily. 10/30/22   [provider]  hydrochlorothiazide  (HYDRODIURIL ) 25 MG tablet Take 25 mg by mouth daily. 09/03/19   [provider]  hydrOXYzine  (ATARAX /VISTARIL ) 50 MG tablet Take 50 mg by mouth 3 (three) times daily.    [provider]  ibuprofen  (ADVIL ) 800 MG tablet Take 1 tablet (800 mg total) by mouth every 6 (six) hours as needed. 04/24/21   Tobie Franky SQUIBB, DPM  lidocaine  4 % Place 1 patch onto the skin 2 (two) times daily. 10/14/23   Charlyn Sora, MD  lisinopril  (ZESTRIL ) 40 MG tablet Take 40 mg by mouth daily. 08/17/19  [provider]  loratadine  (CLARITIN ) 10 MG tablet Take 10 mg by mouth daily.    [provider]  meloxicam  (MOBIC ) 15 MG tablet Take 1 tablet (15 mg total) by mouth daily. 12/26/22   Tobie Franky SQUIBB, DPM  methocarbamol  (ROBAXIN ) 500 MG tablet Take 1 tablet (500 mg total) by mouth 2 (two) times daily. 09/30/22   Doretha Folks, MD  methylPREDNISolone  (MEDROL  DOSEPAK) 4 MG TBPK tablet Take as directed 11/14/22   Tobie Franky SQUIBB, DPM  mineral oil light external liquid Apply 1 application topically 2 (two) times daily as needed (inner ear itching.).    [provider]  montelukast  (SINGULAIR ) 10 MG  tablet TAKE 1 TABLET BY MOUTH EVERYDAY AT BEDTIME 09/04/18   Riccio, Angela C, DO  Multiple Vitamin (MULTIVITAMIN WITH MINERALS) TABS tablet Take 1 tablet by mouth daily.    [provider]  ondansetron  (ZOFRAN ) 4 MG tablet Take 1 tablet (4 mg total) by mouth every 6 (six) hours as needed for nausea. Patient not taking: Reported on 12/07/2019 10/15/19   Fidel Rogue, MD  pantoprazole  (PROTONIX ) 40 MG tablet Take 40 mg by mouth daily. 02/27/19   [provider]  polyethylene glycol-electrolytes (NULYTELY /GOLYTELY ) 420 g solution Take 4,000 mLs by mouth as directed. 04/15/19   Teressa Toribio SQUIBB, MD  pregabalin  (LYRICA ) 100 MG capsule Take 1 capsule (100 mg total) by mouth 2 (two) times daily. 03/08/23   Tobie Franky SQUIBB, DPM  senna (SENOKOT) 8.6 MG TABS tablet Take 2 tablets (17.2 mg total) by mouth at bedtime. Patient not taking: Reported on 12/07/2019 10/15/19   Fidel Rogue, MD  SIMPLY SALINE NA Place 1 spray into the nose 4 (four) times daily as needed (congestion.).    [provider]  tiZANidine  (ZANAFLEX ) 2 MG tablet Take 1 tablet (2 mg total) by mouth every 8 (eight) hours as needed for muscle spasms. 11/18/20   Nanavati, Ankit, MD  traMADol (ULTRAM) 50 MG tablet Take 50 mg by mouth every 6 (six) hours as needed.    [provider]  Turmeric 500 MG TABS Take 500 mg by mouth 2 (two) times a week.    [provider]  Vitamin D, Ergocalciferol, (DRISDOL) 1.25 MG (50000 UNIT) CAPS capsule Take 50,000 Units by mouth every Monday. 09/29/19   [provider]    Allergies: Fexofenadine    Review of Systems  Updated Vital Signs BP (!) 156/84 (BP Location: Right Arm)   Pulse 61   Temp 99 F (37.2 C) (Oral)   Resp 16   SpO2 100%   Physical Exam Constitutional:      General: She is not in acute distress.    Appearance: She is well-developed. She is not ill-appearing.  Pulmonary:     Effort: Pulmonary effort is normal.  Musculoskeletal:         General: Normal range of motion.     Cervical back: Normal range of motion.     Comments: Left index finger swollen with pus pocket along the cuticle. Pad of the finger is soft. No proximal tenderness.   Skin:    General: Skin is warm and dry.  Neurological:     Mental Status: She is alert and oriented to person, place, and time.     (all labs ordered are listed, but only abnormal results are displayed) Labs Reviewed - No data to display  EKG: None  Radiology: No results found.   Drain paronychia  Date/Time: 03/21/2024 2:01 PM  Performed  by: Odell Balls, PA-C Authorized by: Odell Balls, PA-C  Consent: Verbal consent obtained Consent given by: patient Patient understanding: patient states understanding of the procedure being performed Patient identity confirmed: verbally with patient Time out: Immediately prior to procedure a time out was called to verify the correct patient, procedure, equipment, support staff and site/side marked as required. Local anesthesia used: yes Anesthesia: digital block  Anesthesia: Local anesthesia used: yes Local Anesthetic: lidocaine  2% without epinephrine Anesthetic total: 3 mL  Sedation: Patient sedated: no  Patient tolerance: patient tolerated the procedure well with no immediate complications      Medications Ordered in the ED  lidocaine  (PF) (XYLOCAINE ) 1 % injection 5 mL (5 mLs Infiltration Given by Other 03/21/24 1408)                                    Medical Decision Making Risk Prescription drug management.        Final diagnoses:  Paronychia of finger of left hand    ED Discharge Orders          Ordered    cephALEXin  (KEFLEX ) 500 MG capsule  4 times daily        03/21/24 1403               Odell Balls, PA-C 03/21/24 1422    Elnor Savant A, DO 03/23/24 [phone]

## 2024-04-01 ENCOUNTER — Ambulatory Visit: Payer: Medicare (Managed Care) | Admitting: Podiatry

## 2024-06-04 ENCOUNTER — Ambulatory Visit (INDEPENDENT_AMBULATORY_CARE_PROVIDER_SITE_OTHER): Payer: Medicare (Managed Care)

## 2024-06-04 ENCOUNTER — Ambulatory Visit: Payer: Medicare (Managed Care) | Admitting: Podiatry

## 2024-06-04 DIAGNOSIS — G5791 Unspecified mononeuropathy of right lower limb: Secondary | ICD-10-CM

## 2024-06-04 DIAGNOSIS — Q666 Other congenital valgus deformities of feet: Secondary | ICD-10-CM

## 2024-06-04 DIAGNOSIS — M19071 Primary osteoarthritis, right ankle and foot: Secondary | ICD-10-CM

## 2024-06-04 DIAGNOSIS — M722 Plantar fascial fibromatosis: Secondary | ICD-10-CM

## 2024-06-04 MED ORDER — TRIAMCINOLONE ACETONIDE 40 MG/ML IJ SUSP
20.0000 mg | Freq: Once | INTRAMUSCULAR | Status: AC
  Administered 2024-06-04: 20 mg

## 2024-06-05 NOTE — Progress Notes (Signed)
 She presents today requesting to see me with a chief concern of pain to her right foot.  She states that it really hurts terribly across the midfoot up my inside of my ankle on the bottom of my foot and even my big toe joint.  Objective: Vital signs are stable alert oriented x 3 large nonpulsatile nodule laded midfoot consistent with osteoarthritic change osteoarthritis of the first metatarsal phalangeal joint area as well with neuritis along the medial aspect of the foot.  Pes planovalgus is also noted right.  Radiographs do confirm osteoarthritic change with pes planovalgus in the midfoot os so osteoarthritic change of the first metatarsophalangeal joint.  Generalized demineralization of bone and soft tissue increase in density plantar fascial canny insertion site.  Assessment: Osteoarthritis plantar fasciitis on the right foot.  Plan: Discussed etiology pathology conservative surgical therapies I injected just distal to the insertion site of the medial plantar fascia calcaneal insertion slip.  10 mg Kenalog pentagrams Marcaine  point maximal tenderness discussed appropriate shoe gear lacing techniques excetra.  I will follow-up with her in the near future for reevaluation.

## 2024-07-21 ENCOUNTER — Ambulatory Visit: Payer: Medicare (Managed Care) | Admitting: Podiatry

## 2024-08-13 ENCOUNTER — Encounter: Payer: Self-pay | Admitting: Sports Medicine

## 2024-08-13 ENCOUNTER — Ambulatory Visit: Payer: Medicare (Managed Care)

## 2024-08-13 ENCOUNTER — Ambulatory Visit: Payer: Medicare (Managed Care) | Admitting: Sports Medicine

## 2024-08-13 VITALS — BP 138/80 | HR 56 | Temp 98.5°F | Ht 69.0 in | Wt 249.0 lb

## 2024-08-13 DIAGNOSIS — Z0000 Encounter for general adult medical examination without abnormal findings: Secondary | ICD-10-CM | POA: Diagnosis not present

## 2024-08-13 DIAGNOSIS — G8929 Other chronic pain: Secondary | ICD-10-CM | POA: Diagnosis not present

## 2024-08-13 DIAGNOSIS — J449 Chronic obstructive pulmonary disease, unspecified: Secondary | ICD-10-CM

## 2024-08-13 DIAGNOSIS — J302 Other seasonal allergic rhinitis: Secondary | ICD-10-CM | POA: Diagnosis not present

## 2024-08-13 DIAGNOSIS — R000 Tachycardia, unspecified: Secondary | ICD-10-CM

## 2024-08-13 DIAGNOSIS — M5417 Radiculopathy, lumbosacral region: Secondary | ICD-10-CM | POA: Insufficient documentation

## 2024-08-13 DIAGNOSIS — F411 Generalized anxiety disorder: Secondary | ICD-10-CM

## 2024-08-13 DIAGNOSIS — M25562 Pain in left knee: Secondary | ICD-10-CM

## 2024-08-13 DIAGNOSIS — K219 Gastro-esophageal reflux disease without esophagitis: Secondary | ICD-10-CM | POA: Diagnosis not present

## 2024-08-13 NOTE — Assessment & Plan Note (Signed)
 C/o chronic sinus congestion Cont with flonase , claritin  Will add singulair  Will refer to ENT Orders:   Ambulatory referral to ENT

## 2024-08-13 NOTE — Progress Notes (Signed)
 "  Chief Complaint  Patient presents with   Medicare Wellness     Subjective:   Christina Ray is a 69 y.o. female who presents for a Medicare Annual Wellness Visit.  Visit info / Clinical Intake: Persons participating in visit and providing information:: patient Medicare Wellness Visit Mode:: In-person (required for WTM) Interpreter Needed?: No Pre-visit prep was completed: no AWV questionnaire completed by patient prior to visit?: no Living arrangements:: (!) lives alone Patient's Overall Health Status Rating: good Typical amount of pain: none Does pain affect daily life?: no Are you currently prescribed opioids?: no  Dietary Habits and Nutritional Risks How many meals a day?: (!) 1 Eats fruit and vegetables daily?: yes Most meals are obtained by: preparing own meals; eating out In the last 2 weeks, have you had any of the following?: none Diabetic:: no  Functional Status Activities of Daily Living (to include ambulation/medication): Independent Ambulation: Independent Medication Administration: Independent Home Management (perform basic housework or laundry): Independent Manage your own finances?: yes Primary transportation is: facility / other Concerns about vision?: no *vision screening is required for WTM* Concerns about hearing?: (!) yes (ear pain) Uses hearing aids?: no Hear whispered voice?: yes  Fall Screening Falls in the past year?: 0 Number of falls in past year: 0 Was there an injury with Fall?: 0 Fall Risk Category Calculator: 0 Patient Fall Risk Level: Low Fall Risk  Fall Risk Patient at Risk for Falls Due to: Impaired balance/gait; No Fall Risks Fall risk Follow up: Falls evaluation completed  Home and Transportation Safety: All rugs have non-skid backing?: yes All stairs or steps have railings?: N/A, no stairs Grab bars in the bathtub or shower?: yes Have non-skid surface in bathtub or shower?: yes Good home lighting?: yes Regular seat belt  use?: yes Hospital stays in the last year:: no  Cognitive Assessment Difficulty concentrating, remembering, or making decisions? : no Will 6CIT or Mini Cog be Completed: no 6CIT or Mini Cog Declined: patient alert, oriented, able to answer questions appropriately and recall recent events  Advance Directives (For Healthcare) Does Patient Have a Medical Advance Directive?: No Would patient like information on creating a medical advance directive?: No - Patient declined  Reviewed/Updated  Reviewed/Updated: Reviewed All (Medical, Surgical, Family, Medications, Allergies, Care Teams, Patient Goals)    Allergies (verified) Ezetimibe, Fexofenadine, Fluconazole, Loratadine , and Montelukast    Current Medications (verified) Outpatient Encounter Medications as of 08/13/2024  Medication Sig   atenolol  (TENORMIN ) 50 MG tablet TAKE 1 TABLET BY MOUTH EVERY DAY   atorvastatin  (LIPITOR) 20 MG tablet Take 20 mg by mouth daily.   busPIRone (BUSPAR) 10 MG tablet Take 10 mg by mouth 2 (two) times daily.   Calcium -Magnesium-Zinc (CAL-MAG-ZINC PO) Take 1 tablet by mouth 2 (two) times a week.   Cyanocobalamin (VITAMIN B-12) 2500 MCG SUBL Take 2,500 mcg by mouth 2 (two) times a week.   docusate sodium  (COLACE) 100 MG capsule Take 1 capsule (100 mg total) by mouth 2 (two) times daily. (Patient taking differently: Take 100 mg by mouth daily as needed for moderate constipation. )   fluticasone  (FLONASE ) 50 MCG/ACT nasal spray Place 2 sprays into both nostrils daily.   hydrochlorothiazide  (HYDRODIURIL ) 25 MG tablet Take 25 mg by mouth daily.   hydrOXYzine  (ATARAX /VISTARIL ) 50 MG tablet Take 50 mg by mouth 3 (three) times daily.   lidocaine  4 % Place 1 patch onto the skin 2 (two) times daily.   lisinopril  (ZESTRIL ) 40 MG tablet Take 40 mg by  mouth daily.   loratadine  (CLARITIN ) 10 MG tablet Take 10 mg by mouth daily.   mineral oil light external liquid Apply 1 application topically 2 (two) times daily as needed  (inner ear itching.).   Multiple Vitamin (MULTIVITAMIN WITH MINERALS) TABS tablet Take 1 tablet by mouth daily.   pantoprazole  (PROTONIX ) 40 MG tablet Take 40 mg by mouth daily.   polyethylene glycol-electrolytes (NULYTELY /GOLYTELY ) 420 g solution Take 4,000 mLs by mouth as directed.   rosuvastatin (CRESTOR) 20 MG tablet Take 20 mg by mouth at bedtime.   SIMPLY SALINE NA Place 1 spray into the nose 4 (four) times daily as needed (congestion.).   No facility-administered encounter medications on file as of 08/13/2024.    History: Past Medical History:  Diagnosis Date   Allergy    Anxiety    Arthritis    Asthma    Chronic back pain    Complication of anesthesia    Diverticulitis    GERD (gastroesophageal reflux disease)    Hyperlipidemia    Hypertension    PONV (postoperative nausea and vomiting)    Vertigo    Past Surgical History:  Procedure Laterality Date   ABDOMINAL HYSTERECTOMY     Arthroscopic knee surgery Right    X3   HAND SURGERY Right    INCISION AND DRAINAGE HIP Right 12/10/2019   Procedure: IRRIGATION AND DEBRIDEMENT HIP;  Surgeon: Fidel Rogue, MD;  Location: [facility];  Service: Orthopedics;  Laterality: Right;   TOTAL HIP ARTHROPLASTY Right 10/14/2019   Procedure: TOTAL HIP ARTHROPLASTY ANTERIOR APPROACH;  Surgeon: Fidel Rogue, MD;  Location: [facility];  Service: Orthopedics;  Laterality: Right;   Family History  Problem Relation Age of Onset   Hypertension Other    Cancer Other    Congestive Heart Failure Mother 57   Cardiomyopathy Father        He has an ICD, but no comment of MI.  (Is in his 2s   Social History   Occupational History   Occupation: retired  Tobacco Use   Smoking status: Former    Current packs/day: 0.00    Types: Cigarettes    Quit date: 1989    Years since quitting: 37.0   Smokeless tobacco: Never  Vaping Use   Vaping status: Never Used  Substance and Sexual Activity   Alcohol  use: No   Drug use: No   Sexual activity: Yes     Birth control/protection: Surgical   Tobacco Counseling Counseling given: Not Answered  SDOH Screenings   Food Insecurity: No Food Insecurity (08/13/2024)  Housing: Unknown (08/13/2024)  Transportation Needs: No Transportation Needs (08/13/2024)  Utilities: Not At Risk (08/13/2024)  Alcohol  Screen: Low Risk (08/13/2024)  Depression (PHQ2-9): Low Risk (08/13/2024)  Physical Activity: Insufficiently Active (08/13/2024)  Social Connections: Moderately Integrated (08/13/2024)  Stress: No Stress Concern Present (08/13/2024)  Tobacco Use: Medium Risk (08/13/2024)  Health Literacy: Adequate Health Literacy (08/13/2024)   See flowsheets for full screening details  Depression Screen PHQ 2 & 9 Depression Scale- Over the past 2 weeks, how often have you been bothered by any of the following problems? Little interest or pleasure in doing things: 1 Feeling down, depressed, or hopeless (PHQ Adolescent also includes...irritable): 1 PHQ-2 Total Score: 2 Trouble falling or staying asleep, or sleeping too much: 0 Feeling tired or having little energy: 0 Poor appetite or overeating (PHQ Adolescent also includes...weight loss): 0 Feeling bad about yourself - or that you are a failure or have let yourself or your family  down: 0 Trouble concentrating on things, such as reading the newspaper or watching television [facility] Adolescent also includes...like school work): 0 Moving or speaking so slowly that other people could have noticed. Or the opposite - being so fidgety or restless that you have been moving around a lot more than usual: 0 Thoughts that you would be better off dead, or of hurting yourself in some way: 0 PHQ-9 Total Score: 2 If you checked off any problems, how difficult have these problems made it for you to do your work, take care of things at home, or get along with other people?: Somewhat difficult  Depression Treatment Depression Interventions/Treatment : Community Resources Provided      Goals Addressed             This Visit's Progress    Patient Stated   On track    Be healthy as possible; strength training             Objective:    Today's Vitals   08/13/24 1358  BP: 138/80  Pulse: (!) 56  Temp: 98.5 F (36.9 C)  SpO2: 100%  Weight: 249 lb (112.9 kg)  Height: 5' 9 (1.753 m)   Body mass index is 36.77 kg/m.  Hearing/Vision screen No results found. Immunizations and Health Maintenance Health Maintenance  Topic Date Due   Medicare Annual Wellness (AWV)  Never done   Colonoscopy  Never done   Zoster Vaccines- Shingrix (1 of 2) Never done   Mammogram  08/13/2025 (Originally [birth year]/1994)   COVID-19 Vaccine (6 - 2025-26 season) 09/03/2025 (Originally 03/23/2024)   DTaP/Tdap/Td (2 - Td or Tdap) 03/05/2028   Pneumococcal Vaccine: 50+ Years  Completed   Influenza Vaccine  Completed   Bone Density Scan  Completed   Hepatitis C Screening  Completed   Meningococcal B Vaccine  Aged Out        Assessment/Plan:  This is a routine wellness examination for Carynn.  Patient Care Team: Sherlynn Madden, MD as PCP - General (Internal Medicine)  I have personally reviewed and noted the following in the patients chart:   Medical and social history Use of alcohol , tobacco or illicit drugs  Current medications and supplements including opioid prescriptions. Functional ability and status Nutritional status Physical activity Advanced directives List of other physicians Hospitalizations, surgeries, and ER visits in previous 12 months Vitals Screenings to include cognitive, depression, and falls Referrals and appointments  No orders of the defined types were placed in this encounter.  In addition, I have reviewed and discussed with patient certain preventive protocols, quality metrics, and best practice recommendations. A written personalized care plan for preventive services as well as general preventive health recommendations were provided to  patient.   Shanda LELON Sharps, CMA   08/13/2024   No follow-ups on file.  After Visit Summary: (In Person-Declined) Patient declined AVS at this time.  Nurse Notes: Non-Face to Face or Face to Face 10 minute visit Encounter  "

## 2024-08-13 NOTE — Assessment & Plan Note (Signed)
 Avoid spicy foods Denies bloody or dark stools Cont with pantoproazole

## 2024-08-13 NOTE — Progress Notes (Signed)
I agree with the A/P

## 2024-08-13 NOTE — Progress Notes (Signed)
 "  Careteam: Patient Care Team: Sherlynn Madden, MD as PCP - General (Internal Medicine)  Allergies[1]  Chief Complaint  Patient presents with   Establish Care    New pt est care. Pt declined mammogram. Cologuard completed.    Discussed the use of AI scribe software for clinical note transcription with the patient, who gave verbal consent to proceed.  History of Present Illness  Christina Ray is a 69 year old female with COPD and anxiety who presents to establish care.  She has a history of COPD with recent worsening of her breathing, although she has used her rescue inhaler only once or twice in the last two to three weeks. She quit smoking over 25 years ago. She uses Flonase  and an allergy medication for sinus pain, post-nasal drip, and occasional drainage for about a month.  Her anxiety was severe four to five months ago due to personal issues but has since improved. She takes anxiety medication as needed but does not recollect the name of the medication.  She has a history of tachycardia and was previously evaluated with a Holter monitor. She takes atenolol  and experiences occasional palpitations but no persistent symptoms. She has not had recent cardiology follow-up.  She experiences gastrointestinal issues, including heartburn and occasional diarrhea. She takes medication for acid reflux and uses stool softeners. No blood in stool or urine and no recent urinary symptoms.  She has osteoarthritis affecting her knees and feet, with two prior arthroscopic surgeries on her right knee and fluid drainage about six months ago. She takes ibuprofen  800 mg as needed and uses Tylenol  for pain management.  She reports intermittent pain under her left rib for about a year, with no recent trauma.  She lives alone, maintains hydration, and has not experienced any recent falls.    Review of Systems:  Review of Systems  Constitutional:  Negative for chills and fever.  HENT:   Negative for congestion and sore throat.   Eyes:  Negative for blurred vision.  Respiratory:  Positive for shortness of breath (exertional). Negative for cough and sputum production.   Cardiovascular:  Negative for chest pain, palpitations and leg swelling.  Gastrointestinal:  Negative for abdominal pain, heartburn and nausea.  Genitourinary:  Negative for dysuria, frequency and hematuria.  Musculoskeletal:  Positive for joint pain. Negative for falls and myalgias.  Neurological:  Negative for dizziness, sensory change and focal weakness.   Negative unless indicated in HPI.   Patient Active Problem List   Diagnosis Date Noted   Lumbosacral radiculopathy 08/13/2024   Synovial cyst of left popliteal space 01/28/2024   Trochanteric bursitis of right hip 10/31/2023   Hemorrhoids 07/12/2021   Exostosis 04/03/2021   Ganglion cyst of left foot 03/20/2021   Hardening of the aorta (main artery of the heart) 09/14/2020   Spinal stenosis of lumbar region 09/14/2020   Chronic kidney disease, stage 3a (HCC) 08/31/2020   Asthma 08/28/2020   Diverticular disease 08/28/2020   Hyperlipidemia 08/28/2020   Osteoarthritis 08/28/2020   History of total hip arthroplasty 03/09/2020   Presence of right artificial hip joint 10/26/2019   Osteoarthritis of right knee 10/14/2019   Osteoarthritis of right hip 10/14/2019   Vertigo 06/15/2019   Candidate for statin therapy due to risk of future cardiovascular event 06/09/2019   Acute viral syndrome 07/31/2018   Otitis media 07/30/2018   Nasal congestion 11/13/2017   Shortness of breath 11/12/2017   Chest pain with moderate risk for cardiac etiology 11/12/2017  Chronic back pain 10/02/2017   Other abnormal glucose 06/24/2017   Palpitations 06/24/2017   Healthcare maintenance 06/24/2017   Essential hypertension 04/29/2017   Anxiety state 04/29/2017   Seasonal allergies 04/29/2017   GERD (gastroesophageal reflux disease) 04/29/2017   Past Medical  History:  Diagnosis Date   Allergy    Anxiety    Arthritis    Asthma    Chronic back pain    Complication of anesthesia    Diverticulitis    GERD (gastroesophageal reflux disease)    Hyperlipidemia    Hypertension    PONV (postoperative nausea and vomiting)    Vertigo    Past Surgical History:  Procedure Laterality Date   ABDOMINAL HYSTERECTOMY     Arthroscopic knee surgery Right    X3   HAND SURGERY Right    INCISION AND DRAINAGE HIP Right 12/10/2019   Procedure: IRRIGATION AND DEBRIDEMENT HIP;  Surgeon: Fidel Rogue, MD;  Location: [facility];  Service: Orthopedics;  Laterality: Right;   TOTAL HIP ARTHROPLASTY Right 10/14/2019   Procedure: TOTAL HIP ARTHROPLASTY ANTERIOR APPROACH;  Surgeon: Fidel Rogue, MD;  Location: [facility];  Service: Orthopedics;  Laterality: Right;   Social History[2] Family History  Problem Relation Age of Onset   Hypertension Other    Cancer Other    Congestive Heart Failure Mother 68   Cardiomyopathy Father        He has an ICD, but no comment of MI.  (Is in his 53s   Allergies[3]  Medications: Patient's Medications  New Prescriptions   No medications on file  Previous Medications   ATENOLOL  (TENORMIN ) 50 MG TABLET    TAKE 1 TABLET BY MOUTH EVERY DAY   ATORVASTATIN  (LIPITOR) 20 MG TABLET    Take 20 mg by mouth daily.   BUSPIRONE (BUSPAR) 10 MG TABLET    Take 10 mg by mouth 2 (two) times daily.   CALCIUM -MAGNESIUM-ZINC (CAL-MAG-ZINC PO)    Take 1 tablet by mouth 2 (two) times a week.   CYANOCOBALAMIN (VITAMIN B-12) 2500 MCG SUBL    Take 2,500 mcg by mouth 2 (two) times a week.   DOCUSATE SODIUM  (COLACE) 100 MG CAPSULE    Take 1 capsule (100 mg total) by mouth 2 (two) times daily.   FLUTICASONE  (FLONASE ) 50 MCG/ACT NASAL SPRAY    Place 2 sprays into both nostrils daily.   HYDROCHLOROTHIAZIDE  (HYDRODIURIL ) 25 MG TABLET    Take 25 mg by mouth daily.   HYDROXYZINE  (ATARAX /VISTARIL ) 50 MG TABLET    Take 50 mg by mouth 3 (three) times daily.    LIDOCAINE  4 %    Place 1 patch onto the skin 2 (two) times daily.   LISINOPRIL  (ZESTRIL ) 40 MG TABLET    Take 40 mg by mouth daily.   LORATADINE  (CLARITIN ) 10 MG TABLET    Take 10 mg by mouth daily.   MINERAL OIL LIGHT EXTERNAL LIQUID    Apply 1 application topically 2 (two) times daily as needed (inner ear itching.).   MULTIPLE VITAMIN (MULTIVITAMIN WITH MINERALS) TABS TABLET    Take 1 tablet by mouth daily.   PANTOPRAZOLE  (PROTONIX ) 40 MG TABLET    Take 40 mg by mouth daily.   POLYETHYLENE GLYCOL-ELECTROLYTES (NULYTELY /GOLYTELY ) 420 G SOLUTION    Take 4,000 mLs by mouth as directed.   ROSUVASTATIN (CRESTOR) 20 MG TABLET    Take 20 mg by mouth at bedtime.   SIMPLY SALINE NA    Place 1 spray into the nose 4 (four) times  daily as needed (congestion.).  Modified Medications   No medications on file  Discontinued Medications   ACETAMINOPHEN  (TYLENOL ) 500 MG TABLET    Take 1 tablet (500 mg total) by mouth every 6 (six) hours as needed.   CEFADROXIL  (DURICEF) 500 MG CAPSULE    Take 1 capsule (500 mg total) by mouth 2 (two) times daily.   CELECOXIB  (CELEBREX ) 200 MG CAPSULE    Take 1 capsule (200 mg total) by mouth daily.   CEPHALEXIN  (KEFLEX ) 500 MG CAPSULE    Take 1 capsule (500 mg total) by mouth 4 (four) times daily.   CICLOPIROX  (PENLAC ) 8 % SOLUTION    Apply topically at bedtime. Apply over nail and surrounding skin. Apply daily over previous coat. After seven (7) days, may remove with alcohol  and continue cycle.   CICLOPIROX  (PENLAC ) 8 % SOLUTION    Apply topically at bedtime. Apply over nail and surrounding skin. Apply daily over previous coat. After seven (7) days, may remove with alcohol  and continue cycle.   CYCLOBENZAPRINE (FLEXERIL) 5 MG TABLET    Take 5 mg by mouth 3 (three) times daily as needed.   GABAPENTIN (NEURONTIN) 100 MG CAPSULE    Take 200 mg by mouth 3 (three) times daily.   IBUPROFEN  (ADVIL ) 800 MG TABLET    Take 1 tablet (800 mg total) by mouth every 6 (six) hours as needed.    MELOXICAM  (MOBIC ) 15 MG TABLET    Take 1 tablet (15 mg total) by mouth daily.   METHOCARBAMOL  (ROBAXIN ) 500 MG TABLET    Take 1 tablet (500 mg total) by mouth 2 (two) times daily.   METHYLPREDNISOLONE  (MEDROL  DOSEPAK) 4 MG TBPK TABLET    Take as directed   MONTELUKAST  (SINGULAIR ) 10 MG TABLET    TAKE 1 TABLET BY MOUTH EVERYDAY AT BEDTIME   PREGABALIN  (LYRICA ) 100 MG CAPSULE    Take 1 capsule (100 mg total) by mouth 2 (two) times daily.   TIZANIDINE  (ZANAFLEX ) 2 MG TABLET    Take 1 tablet (2 mg total) by mouth every 8 (eight) hours as needed for muscle spasms.   TRAMADOL (ULTRAM) 50 MG TABLET    Take 50 mg by mouth every 6 (six) hours as needed.   TURMERIC 500 MG TABS    Take 500 mg by mouth 2 (two) times a week.   VITAMIN D, ERGOCALCIFEROL, (DRISDOL) 1.25 MG (50000 UNIT) CAPS CAPSULE    Take 50,000 Units by mouth every Monday.    Physical Exam: Vitals:   08/13/24 1310  BP: 138/80  Pulse: (!) 56  Temp: 98.5 F (36.9 C)  TempSrc: Oral  SpO2: 100%  Weight: 249 lb (112.9 kg)  Height: 5' 9 (1.753 m)   Body mass index is 36.77 kg/m. BP Readings from Last 3 Encounters:  08/13/24 138/80  03/21/24 (!) 156/84  10/14/23 137/75   Wt Readings from Last 3 Encounters:  08/13/24 249 lb (112.9 kg)  10/14/23 250 lb (113.4 kg)  10/13/23 250 lb (113.4 kg)    Physical Exam Constitutional:      Appearance: Normal appearance.  HENT:     Head: Normocephalic and atraumatic.     Mouth/Throat:     Pharynx: No oropharyngeal exudate or posterior oropharyngeal erythema.     Comments: No sinus tenderness Cardiovascular:     Rate and Rhythm: Normal rate and regular rhythm.  Pulmonary:     Effort: Pulmonary effort is normal. No respiratory distress.     Breath sounds: Normal breath  sounds. No wheezing.  Abdominal:     General: Bowel sounds are normal. There is no distension.     Tenderness: There is no abdominal tenderness. There is no guarding or rebound.     Comments:    Musculoskeletal:         General: No swelling or tenderness.  Skin:    General: Skin is dry.  Neurological:     Mental Status: She is alert. Mental status is at baseline.     Sensory: No sensory deficit.     Motor: No weakness.     Labs reviewed: Basic Metabolic Panel: No results for input(s): NA, K, CL, CO2, GLUCOSE, BUN, CREATININE, CALCIUM , MG, PHOS, TSH in the last 8760 hours. Liver Function Tests: No results for input(s): AST, ALT, ALKPHOS, BILITOT, PROT, ALBUMIN in the last 8760 hours. No results for input(s): LIPASE, AMYLASE in the last 8760 hours. No results for input(s): AMMONIA in the last 8760 hours. CBC: No results for input(s): WBC, NEUTROABS, HGB, HCT, MCV, PLT in the last 8760 hours. Lipid Panel: No results for input(s): CHOL, HDL, LDLCALC, TRIG, CHOLHDL, LDLDIRECT in the last 8760 hours. TSH: No results for input(s): TSH in the last 8760 hours. A1C: Lab Results  Component Value Date   HGBA1C 5.4 06/21/2017    Assessment & Plan GAD (generalized anxiety disorder) Stable  Pt does not remember the name of the medication, instructed to call us  with her med list    Chronic obstructive pulmonary disease, unspecified COPD type (HCC) No wheezing Lungs clear to delanna Does not remember the name of her inhaler  Need records from previous PCP    Tachycardia Stable C/o exertional SOB Will order ECHO Will refer to cardiology Orders:   ECHOCARDIOGRAM COMPLETE; Future   Ambulatory referral to Cardiology  Seasonal allergies C/o chronic sinus congestion Cont with flonase , claritin  Will add singulair  Will refer to ENT Orders:   Ambulatory referral to ENT  Chronic pain of left knee Chronic  No redness or swelling Follows with ortho Instructed patient to avoid nsaids  Take tylenol  1 gm tid prn     Gastroesophageal reflux disease, unspecified whether esophagitis present Avoid spicy foods Denies bloody or dark  stools Cont with pantoproazole    Need records from previous PCP Had blood work 2 months ago  Return in about 3 months (around 11/11/2024).:   Christina Ray     [1]  Allergies Allergen Reactions   Ezetimibe Other (See Comments)   Fexofenadine     Other Reaction(s): Unknown   Fluconazole Other (See Comments)   Loratadine  Other (See Comments)   Montelukast  Other (See Comments)  [2]  Social History Tobacco Use   Smoking status: Former    Current packs/day: 0.00    Types: Cigarettes    Quit date: 1989    Years since quitting: 37.0   Smokeless tobacco: Never  Vaping Use   Vaping status: Never Used  Substance Use Topics   Alcohol  use: No   Drug use: No  [3]  Allergies Allergen Reactions   Ezetimibe Other (See Comments)   Fexofenadine     Other Reaction(s): Unknown   Fluconazole Other (See Comments)   Loratadine  Other (See Comments)   Montelukast  Other (See Comments)   "

## 2024-08-13 NOTE — Patient Instructions (Signed)

## 2024-08-21 ENCOUNTER — Ambulatory Visit: Payer: Medicare (Managed Care) | Admitting: Physician Assistant

## 2024-08-25 ENCOUNTER — Other Ambulatory Visit: Payer: Self-pay

## 2024-08-25 ENCOUNTER — Telehealth: Payer: Self-pay

## 2024-08-25 MED ORDER — LISINOPRIL 40 MG PO TABS: 40.0000 mg | ORAL_TABLET | Freq: Every day | ORAL | 1 refills | Status: AC

## 2024-08-25 MED ORDER — ATENOLOL 50 MG PO TABS: 50.0000 mg | ORAL_TABLET | Freq: Every day | ORAL | 2 refills | Status: AC

## 2024-08-25 MED ORDER — ROSUVASTATIN CALCIUM 20 MG PO TABS: 20.0000 mg | ORAL_TABLET | Freq: Every day | ORAL | 1 refills | Status: AC

## 2024-08-25 MED ORDER — HYDROCHLOROTHIAZIDE 25 MG PO TABS: 25.0000 mg | ORAL_TABLET | Freq: Every day | ORAL | 1 refills | Status: AC

## 2024-08-25 NOTE — Telephone Encounter (Signed)
 Copied from CRM #[id number]. Topic: Medical Record Request - Records Request >> Aug 25, 2024 10:57 AM Eva FALCON wrote: Reason for CRM: Pt is requesting her records from [facility] on [address] [place] to be sent over so Dr. Sherlynn can have those records.  Patient must sign [facility] records request, will get at her next appt

## 2024-09-02 ENCOUNTER — Ambulatory Visit: Payer: Medicare (Managed Care) | Admitting: Physician Assistant

## 2024-09-10 ENCOUNTER — Institutional Professional Consult (permissible substitution) (INDEPENDENT_AMBULATORY_CARE_PROVIDER_SITE_OTHER): Payer: Medicare (Managed Care) | Admitting: Otolaryngology

## 2024-09-14 ENCOUNTER — Other Ambulatory Visit (HOSPITAL_BASED_OUTPATIENT_CLINIC_OR_DEPARTMENT_OTHER): Payer: Medicare (Managed Care)

## 2024-11-12 ENCOUNTER — Ambulatory Visit: Payer: Medicare (Managed Care) | Admitting: Sports Medicine
